# Patient Record
Sex: Male | Born: 1950 | Race: White | Hispanic: No | Marital: Married | Smoking: Never smoker
Health system: Southern US, Community
[De-identification: ages and names within clinical notes are randomized; demographics above are authoritative.]

## PROBLEM LIST (undated history)

## (undated) DIAGNOSIS — E1149 Type 2 diabetes mellitus with other diabetic neurological complication: Principal | ICD-10-CM

## (undated) DIAGNOSIS — E669 Obesity, unspecified: Secondary | ICD-10-CM

## (undated) DIAGNOSIS — I10 Essential (primary) hypertension: Secondary | ICD-10-CM

## (undated) DIAGNOSIS — K8020 Calculus of gallbladder without cholecystitis without obstruction: Secondary | ICD-10-CM

## (undated) DIAGNOSIS — E119 Type 2 diabetes mellitus without complications: Secondary | ICD-10-CM

## (undated) DIAGNOSIS — E291 Testicular hypofunction: Secondary | ICD-10-CM

## (undated) DIAGNOSIS — S2239XA Fracture of one rib, unspecified side, initial encounter for closed fracture: Secondary | ICD-10-CM

## (undated) DIAGNOSIS — F329 Major depressive disorder, single episode, unspecified: Secondary | ICD-10-CM

## (undated) DIAGNOSIS — E785 Hyperlipidemia, unspecified: Secondary | ICD-10-CM

## (undated) HISTORY — DX: Major depressive disorder, single episode, unspecified: F32.9

## (undated) HISTORY — DX: Essential (primary) hypertension: I10

## (undated) HISTORY — PX: NASAL POLYP SURGERY: SHX186

## (undated) HISTORY — DX: Testicular hypofunction: E29.1

## (undated) HISTORY — DX: Type 2 diabetes mellitus without complications: E11.9

## (undated) HISTORY — DX: Calculus of gallbladder without cholecystitis without obstruction: K80.20

## (undated) HISTORY — DX: Obesity, unspecified: E66.9

## (undated) HISTORY — DX: Fracture of one rib, unspecified side, initial encounter for closed fracture: S22.39XA

## (undated) HISTORY — DX: Hyperlipidemia, unspecified: E78.5

## (undated) HISTORY — DX: Type 2 diabetes mellitus with other diabetic neurological complication: E11.49

---

## 2003-01-21 DIAGNOSIS — S2249XA Multiple fractures of ribs, unspecified side, initial encounter for closed fracture: Secondary | ICD-10-CM

## 2003-01-21 HISTORY — DX: Multiple fractures of ribs, unspecified side, initial encounter for closed fracture: S22.49XA

## 2006-12-25 ENCOUNTER — Ambulatory Visit: Payer: Self-pay | Admitting: Internal Medicine

## 2006-12-25 DIAGNOSIS — E119 Type 2 diabetes mellitus without complications: Secondary | ICD-10-CM

## 2006-12-25 DIAGNOSIS — F3289 Other specified depressive episodes: Secondary | ICD-10-CM

## 2006-12-25 DIAGNOSIS — E291 Testicular hypofunction: Secondary | ICD-10-CM

## 2006-12-25 DIAGNOSIS — E1149 Type 2 diabetes mellitus with other diabetic neurological complication: Secondary | ICD-10-CM | POA: Insufficient documentation

## 2006-12-25 DIAGNOSIS — I10 Essential (primary) hypertension: Secondary | ICD-10-CM

## 2006-12-25 DIAGNOSIS — F339 Major depressive disorder, recurrent, unspecified: Secondary | ICD-10-CM

## 2006-12-25 DIAGNOSIS — E785 Hyperlipidemia, unspecified: Secondary | ICD-10-CM

## 2006-12-25 DIAGNOSIS — F329 Major depressive disorder, single episode, unspecified: Secondary | ICD-10-CM

## 2006-12-25 HISTORY — DX: Essential (primary) hypertension: I10

## 2006-12-25 HISTORY — DX: Hyperlipidemia, unspecified: E78.5

## 2006-12-25 HISTORY — DX: Type 2 diabetes mellitus without complications: E11.9

## 2006-12-25 HISTORY — DX: Type 2 diabetes mellitus with other diabetic neurological complication: E11.49

## 2006-12-25 HISTORY — DX: Major depressive disorder, single episode, unspecified: F32.9

## 2006-12-25 HISTORY — DX: Testicular hypofunction: E29.1

## 2006-12-25 HISTORY — DX: Other specified depressive episodes: F32.89

## 2006-12-25 LAB — CONVERTED CEMR LAB
Cholesterol, target level: 200 mg/dL
HDL goal, serum: 40 mg/dL
LDL Goal: 100 mg/dL

## 2006-12-28 LAB — CONVERTED CEMR LAB
ALT: 35 units/L (ref 0–53)
AST: 27 units/L (ref 0–37)
Albumin: 4.4 g/dL (ref 3.5–5.2)
Alkaline Phosphatase: 63 units/L (ref 39–117)
BUN: 15 mg/dL (ref 6–23)
Bilirubin, Direct: 0.2 mg/dL (ref 0.0–0.3)
CO2: 29 meq/L (ref 19–32)
Calcium: 10.4 mg/dL (ref 8.4–10.5)
Chloride: 102 meq/L (ref 96–112)
Cholesterol: 128 mg/dL (ref 0–200)
Creatinine, Ser: 0.9 mg/dL (ref 0.4–1.5)
Creatinine,U: 125.7 mg/dL
GFR calc Af Amer: 112 mL/min
GFR calc non Af Amer: 92 mL/min
Glucose, Bld: 166 mg/dL — ABNORMAL HIGH (ref 70–99)
HDL: 31.5 mg/dL — ABNORMAL LOW (ref >39.0)
Hgb A1c MFr Bld: 6.6 % — ABNORMAL HIGH (ref 4.6–6.0)
LDL Cholesterol: 77 mg/dL (ref 0–99)
Microalb Creat Ratio: 25.5 mg/g (ref 0.0–30.0)
Microalb, Ur: 3.2 mg/dL — ABNORMAL HIGH (ref 0.0–1.9)
Potassium: 4.9 meq/L (ref 3.5–5.1)
Sodium: 139 meq/L (ref 135–145)
Total Bilirubin: 0.5 mg/dL (ref 0.3–1.2)
Total CHOL/HDL Ratio: 4.1
Total Protein: 6.9 g/dL (ref 6.0–8.3)
Triglycerides: 99 mg/dL (ref 0–149)
VLDL: 20 mg/dL (ref 0–40)

## 2007-01-04 ENCOUNTER — Encounter: Admission: RE | Admit: 2007-01-04 | Discharge: 2007-01-05 | Payer: Self-pay | Admitting: Internal Medicine

## 2007-01-27 ENCOUNTER — Encounter: Payer: Self-pay | Admitting: Internal Medicine

## 2007-05-18 ENCOUNTER — Ambulatory Visit: Payer: Self-pay | Admitting: Internal Medicine

## 2007-05-21 LAB — CONVERTED CEMR LAB
ALT: 35 units/L (ref 0–53)
BUN: 21 mg/dL (ref 6–23)
CO2: 25 meq/L (ref 19–32)
Chloride: 107 meq/L (ref 96–112)
Cholesterol: 104 mg/dL (ref 0–200)
GFR calc non Af Amer: 92 mL/min
Glucose, Bld: 123 mg/dL — ABNORMAL HIGH (ref 70–99)
LDL Cholesterol: 38 mg/dL (ref 0–99)
PSA: 0.65 ng/mL (ref 0.10–4.00)
Potassium: 4 meq/L (ref 3.5–5.1)

## 2007-06-11 ENCOUNTER — Telehealth: Payer: Self-pay | Admitting: Internal Medicine

## 2007-06-30 ENCOUNTER — Telehealth: Payer: Self-pay | Admitting: *Deleted

## 2007-08-09 ENCOUNTER — Encounter: Payer: Self-pay | Admitting: Internal Medicine

## 2007-09-03 ENCOUNTER — Ambulatory Visit: Payer: Self-pay | Admitting: Internal Medicine

## 2007-09-03 LAB — CONVERTED CEMR LAB
AST: 26 units/L (ref 0–37)
Albumin: 4.3 g/dL (ref 3.5–5.2)
CO2: 27 meq/L (ref 19–32)
GFR calc Af Amer: 112 mL/min
GFR calc non Af Amer: 92 mL/min
Glucose, Bld: 104 mg/dL — ABNORMAL HIGH (ref 70–99)
HDL: 31.6 mg/dL — ABNORMAL LOW (ref >39.0)
Hgb A1c MFr Bld: 6.8 % — ABNORMAL HIGH (ref 4.6–6.0)
Sodium: 141 meq/L (ref 135–145)
Total Bilirubin: 0.7 mg/dL (ref 0.3–1.2)

## 2007-09-10 ENCOUNTER — Ambulatory Visit: Payer: Self-pay | Admitting: Internal Medicine

## 2007-09-10 DIAGNOSIS — E669 Obesity, unspecified: Secondary | ICD-10-CM

## 2007-09-10 HISTORY — DX: Obesity, unspecified: E66.9

## 2007-10-15 ENCOUNTER — Encounter: Payer: Self-pay | Admitting: Internal Medicine

## 2007-12-27 ENCOUNTER — Telehealth: Payer: Self-pay | Admitting: Internal Medicine

## 2008-01-25 ENCOUNTER — Ambulatory Visit: Payer: Self-pay | Admitting: Internal Medicine

## 2008-01-27 LAB — CONVERTED CEMR LAB
AST: 24 units/L (ref 0–37)
BUN: 23 mg/dL (ref 6–23)
CO2: 29 meq/L (ref 19–32)
Calcium: 10.2 mg/dL (ref 8.4–10.5)
Creatinine, Ser: 1 mg/dL (ref 0.4–1.5)
GFR calc Af Amer: 99 mL/min
Glucose, Bld: 163 mg/dL — ABNORMAL HIGH (ref 70–99)
HDL: 39.1 mg/dL (ref >39.0)
Sodium: 141 meq/L (ref 135–145)
Triglycerides: 97 mg/dL (ref 0–149)
VLDL: 19 mg/dL (ref 0–40)

## 2008-05-10 ENCOUNTER — Telehealth: Payer: Self-pay | Admitting: Internal Medicine

## 2008-05-23 ENCOUNTER — Ambulatory Visit: Payer: Self-pay | Admitting: Internal Medicine

## 2008-05-23 LAB — CONVERTED CEMR LAB
AST: 27 units/L (ref 0–37)
Albumin: 4 g/dL (ref 3.5–5.2)
CO2: 24 meq/L (ref 19–32)
Calcium: 9.3 mg/dL (ref 8.4–10.5)
Glucose, Bld: 207 mg/dL — ABNORMAL HIGH (ref 70–99)
HDL: 32.9 mg/dL — ABNORMAL LOW (ref >39.00)
LDL Cholesterol: 63 mg/dL (ref 0–99)
Potassium: 4 meq/L (ref 3.5–5.1)
Sodium: 139 meq/L (ref 135–145)
Total Bilirubin: 0.6 mg/dL (ref 0.3–1.2)
Total CHOL/HDL Ratio: 4
Triglycerides: 150 mg/dL — ABNORMAL HIGH (ref 0.0–149.0)
VLDL: 30 mg/dL (ref 0.0–40.0)

## 2008-05-30 ENCOUNTER — Ambulatory Visit: Payer: Self-pay | Admitting: Internal Medicine

## 2008-08-23 ENCOUNTER — Ambulatory Visit: Payer: Self-pay | Admitting: Internal Medicine

## 2008-08-23 LAB — CONVERTED CEMR LAB
ALT: 27 units/L (ref 0–53)
AST: 26 units/L (ref 0–37)
Albumin: 4 g/dL (ref 3.5–5.2)
CO2: 29 meq/L (ref 19–32)
Calcium: 9.4 mg/dL (ref 8.4–10.5)
Cholesterol: 120 mg/dL (ref 0–200)
Direct LDL: 61.2 mg/dL
GFR calc non Af Amer: 81.34 mL/min (ref >60)
HDL: 36.1 mg/dL — ABNORMAL LOW (ref >39.00)
Potassium: 4.5 meq/L (ref 3.5–5.1)
Sodium: 141 meq/L (ref 135–145)
Total CHOL/HDL Ratio: 3
Triglycerides: 218 mg/dL — ABNORMAL HIGH (ref 0.0–149.0)

## 2008-08-30 ENCOUNTER — Ambulatory Visit: Payer: Self-pay | Admitting: Internal Medicine

## 2008-09-07 ENCOUNTER — Telehealth: Payer: Self-pay | Admitting: Internal Medicine

## 2008-12-21 ENCOUNTER — Ambulatory Visit: Payer: Self-pay | Admitting: Internal Medicine

## 2008-12-21 LAB — CONVERTED CEMR LAB
ALT: 25 units/L (ref 0–53)
Alkaline Phosphatase: 73 units/L (ref 39–117)
BUN: 27 mg/dL — ABNORMAL HIGH (ref 6–23)
Bilirubin, Direct: 0 mg/dL (ref 0.0–0.3)
Calcium: 9.5 mg/dL (ref 8.4–10.5)
Cholesterol: 125 mg/dL (ref 0–200)
Creatinine, Ser: 0.9 mg/dL (ref 0.4–1.5)
GFR calc non Af Amer: 91.75 mL/min (ref >60)
Hgb A1c MFr Bld: 8.3 % — ABNORMAL HIGH (ref 4.6–6.5)
Total Protein: 7.3 g/dL (ref 6.0–8.3)
Triglycerides: 181 mg/dL — ABNORMAL HIGH (ref 0.0–149.0)

## 2009-01-26 ENCOUNTER — Ambulatory Visit: Payer: Self-pay | Admitting: Internal Medicine

## 2009-06-22 ENCOUNTER — Ambulatory Visit: Payer: Self-pay | Admitting: Internal Medicine

## 2009-06-22 LAB — CONVERTED CEMR LAB
ALT: 43 units/L (ref 0–53)
AST: 30 units/L (ref 0–37)
Alkaline Phosphatase: 59 units/L (ref 39–117)
BUN: 22 mg/dL (ref 6–23)
Bilirubin, Direct: 0.1 mg/dL (ref 0.0–0.3)
GFR calc non Af Amer: 118.49 mL/min (ref >60)
Glucose, Bld: 216 mg/dL — ABNORMAL HIGH (ref 70–99)
Potassium: 4.1 meq/L (ref 3.5–5.1)
Total Bilirubin: 0.5 mg/dL (ref 0.3–1.2)
VLDL: 30.4 mg/dL (ref 0.0–40.0)

## 2009-06-28 ENCOUNTER — Telehealth: Payer: Self-pay | Admitting: Internal Medicine

## 2009-07-11 ENCOUNTER — Ambulatory Visit: Payer: Self-pay | Admitting: Internal Medicine

## 2009-07-11 LAB — HM COLONOSCOPY

## 2009-08-08 ENCOUNTER — Telehealth: Payer: Self-pay | Admitting: Internal Medicine

## 2009-08-15 ENCOUNTER — Telehealth: Payer: Self-pay | Admitting: Internal Medicine

## 2009-11-14 ENCOUNTER — Telehealth: Payer: Self-pay | Admitting: Internal Medicine

## 2009-11-22 ENCOUNTER — Ambulatory Visit: Payer: Self-pay | Admitting: Internal Medicine

## 2009-11-22 LAB — CONVERTED CEMR LAB
Alkaline Phosphatase: 64 units/L (ref 39–117)
Bilirubin, Direct: 0.1 mg/dL (ref 0.0–0.3)
CO2: 27 meq/L (ref 19–32)
Calcium: 9.4 mg/dL (ref 8.4–10.5)
GFR calc non Af Amer: 96.39 mL/min (ref >60)
HDL: 38.7 mg/dL — ABNORMAL LOW (ref >39.00)
Sodium: 139 meq/L (ref 135–145)
Total CHOL/HDL Ratio: 3
Total Protein: 6.7 g/dL (ref 6.0–8.3)

## 2009-12-03 ENCOUNTER — Ambulatory Visit: Payer: Self-pay | Admitting: Internal Medicine

## 2009-12-19 ENCOUNTER — Telehealth: Payer: Self-pay | Admitting: Internal Medicine

## 2010-02-19 NOTE — Progress Notes (Signed)
Summary: Pt called re: refill req from [facility]  Phone Note Call from Patient Call back at Home Phone [phone]   Caller: Patient came in to office Summary of Call: Pt came in to office and said that [facility] has sent numerous req to get med refills for the pt, with no response. Pt needs refills on Sertraline 100mg ,  Novolin Insulin 70/30, Metformin 1000mg  , Generic for Actos 30mg , Benazepril 40mg , Simvastatin 20mg , Losartan/HCTZ, Amlodipine Bes 10mg  Fexofenodine 180mg . Pt has dropped off the original list of meds that he needs asap. Pt almost out of meds. If pts not available when called, pt says that is is ok to speak to his wife [name].      Initial call taken by: Lucy Antigua,  August 15, 2009 9:43 AM    Prescriptions: FEXOFENADINE HCL 180 MG TABS (FEXOFENADINE HCL) Take 1 tablet by mouth once a day  #90 x 3   Entered by:   Kern Reap CMA (AAMA)   Authorized by:   Birdie Sons MD   Signed by:   Kern Reap CMA (AAMA) on 08/15/2009   Method used:   Electronically to        [facility] ([facility] (retail)             , [place]         Ph: [phone]       Fax: [phone]   RxID:   [id number] AMLODIPINE BESYLATE 10 MG  TABS (AMLODIPINE BESYLATE) once daily  #90 x 3   Entered by:   Kern Reap CMA (AAMA)   Authorized by:   Birdie Sons MD   Signed by:   Kern Reap CMA (AAMA) on 08/15/2009   Method used:   Electronically to        [facility] (retail)             , [place]         Ph: [phone]       Fax: [phone]   RxID:   [id number] HYZAAR 100-25 MG TABS (LOSARTAN POTASSIUM-HCTZ) once daily  #90 x 3   Entered by:   Kern Reap CMA (AAMA)   Authorized by:   Birdie Sons MD   Signed by:   Kern Reap CMA (AAMA) on 08/15/2009   Method used:   Electronically to        [facility] (retail)             , [place]         Ph: [phone]       Fax: [phone]   RxID:    [id number] SIMVASTATIN 20 MG TABS (SIMVASTATIN) 1 by mouth at bedtime  #90 x 3   Entered by:   Kern Reap CMA (AAMA)   Authorized by:   Birdie Sons MD   Signed by:   Kern Reap CMA (AAMA) on 08/15/2009   Method used:   Electronically to        [facility] (retail)             , [place]         Ph: [phone]       Fax: [phone]   RxID:   [id number] BENAZEPRIL HCL 40 MG  TABS (BENAZEPRIL HCL) 1 tablet by mouth daily  #90 x 3   Entered by:   Kern Reap CMA (AAMA)   Authorized by:   Birdie Sons MD   Signed by:   Kern Reap CMA (  AAMA) on 08/15/2009   Method used:   Electronically to        [facility] (retail)             , [place]         Ph: [phone]       Fax: [phone]   RxID:   [id number] ACTOS 30 MG  TABS (PIOGLITAZONE HCL) Take 1 tablet by mouth once a day  #90 x 3   Entered by:   Kern Reap CMA (AAMA)   Authorized by:   Birdie Sons MD   Signed by:   Kern Reap CMA (AAMA) on 08/15/2009   Method used:   Electronically to        [facility] (retail)             , [place]         Ph: [phone]       Fax: [phone]   RxID:   [id number] METFORMIN HCL 1000 MG TABS (METFORMIN HCL) Take 1 tablet by mouth two times a day  #180 x 3   Entered by:   Kern Reap CMA (AAMA)   Authorized by:   Birdie Sons MD   Signed by:   Kern Reap CMA (AAMA) on 08/15/2009   Method used:   Electronically to        [facility] (retail)             , [place]         Ph: [phone]       Fax: [phone]   RxID:   [id number] NOVOLIN 70/30 70-30 % SUSP (INSULIN ISOPHANE & REGULAR) 60 units subcutaneously  two times a day with meals.  #6 x 3   Entered by:   Kern Reap CMA (AAMA)   Authorized by:   Birdie Sons MD   Signed by:   Kern Reap CMA (AAMA) on 08/15/2009   Method used:   Electronically to        [facility] (retail)             , [place]         Ph: [phone]        Fax: [phone]   RxID:   [id number] ZOLOFT 100 MG  TABS (SERTRALINE HCL) Take 1 tablet by mouth once a day  #90 x 3   Entered by:   Kern Reap CMA (AAMA)   Authorized by:   Birdie Sons MD   Signed by:   Kern Reap CMA (AAMA) on 08/15/2009   Method used:   Electronically to        [facility] (retail)             , [place]         Ph: [phone]       Fax: [phone]   RxID:   [id number]
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
## 2010-02-19 NOTE — Progress Notes (Signed)
Summary: wants generic  Phone Note Call from Patient Call back at [phone]   Caller: Patient--live call Reason for Call: Talk to Nurse Summary of Call: Pt is currently on Triglide 160 mg tab 1qd. He would like to switch to the generic, which is Fenofibrate 160mg  tablets 1qd. I spoke with Dr Lovell Sheehan and he said that it is ok to switch. pt will need #30 called to [facility] on [address] and a 90 day supply with refills send to [facility] @ fax [phone]. any? call pt. Initial call taken by: Warnell Forester,  June 28, 2009 12:54 PM  Follow-up for Phone Call        lmoam=pt with info given. Follow-up by: Warnell Forester,  June 29, 2009 3:50 PM    New/Updated Medications: FENOFIBRATE 160 MG TABS (FENOFIBRATE) take one tab by mouth once daily Prescriptions: FENOFIBRATE 160 MG TABS (FENOFIBRATE) take one tab by mouth once daily  #90 x 3   Entered by:   Kern Reap CMA (AAMA)   Authorized by:   Stacie Glaze MD   Signed by:   Kern Reap CMA (AAMA) on 06/29/2009   Method used:   Printed then faxed to ...       [facility] [address] [address] [phone]* (retail)       [address]       [place], [place]  [address]       Ph: [phone] or [phone]       Fax: [phone]   RxID:   [id number] FENOFIBRATE 160 MG TABS (FENOFIBRATE) take one tab by mouth once daily  #30 x 0   Entered by:   Kern Reap CMA (AAMA)   Authorized by:   Roderick Pee MD   Signed by:   Kern Reap CMA (AAMA) on 06/29/2009   Method used:   Electronically to        [facility] [address] [phone]* (retail)       [address]       [place], [place]  [address]       Ph: [phone] or [phone]       Fax: [phone]   RxID:   [id number]

## 2010-02-19 NOTE — Progress Notes (Signed)
Summary: finofibrate  Phone Note From Pharmacy   Summary of Call: rx clairfication Initial call taken by: Kern Reap CMA Duncan Dull),  August 08, 2009 10:10 AM    Prescriptions: FENOFIBRATE 160 MG TABS (FENOFIBRATE) take one tab by mouth once daily  #90 x 3   Entered by:   Kern Reap CMA (AAMA)   Authorized by:   Birdie Sons MD   Signed by:   Kern Reap CMA (AAMA) on 08/08/2009   Method used:   Electronically to        [facility] ([facility] (retail)             , [place]         Ph:        Fax: [phone]   RxID:   [id number]
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
## 2010-02-19 NOTE — Progress Notes (Signed)
Summary: [facility]  Phone Note Refill Request   Refills Requested: Medication #1:  ACTOS 30 MG  TABS Take 1 tablet by mouth once a day  Medication #2:  HYZAAR 100-25 MG TABS once daily  Medication #3:  SIMVASTATIN 20 MG TABS 1 by mouth at bedtime  Medication #4:  NOVOLIN 70/30 70-30 % SUSP 60 units subcutaneously  two times a day with meals. pt needs all meds sent to [facility] call [phone] fax [phone]  please put employee number on rx's [phone]  Initial call taken by: Heron Sabins,  November 14, 2009 11:48 AM  Follow-up for Phone Call        pt switched pharmacies Follow-up by: Alfred Levins, CMA,  November 15, 2009 4:38 PM    Prescriptions: NOVOLIN 70/30 70-30 % SUSP (INSULIN ISOPHANE & REGULAR) 60 units subcutaneously  two times a day with meals.  #6 x 0   Entered by:   Alfred Levins, CMA   Authorized by:   Birdie Sons MD   Signed by:   Alfred Levins, CMA on 11/15/2009   Method used:   Faxed to ...       [facility] (mail-order)       [address] [place], [place]  [address]       Ph: [phone]       Fax: [phone]   RxID:   [id number] SIMVASTATIN 20 MG TABS (SIMVASTATIN) 1 by mouth at bedtime  #90 x 0   Entered by:   Alfred Levins, CMA   Authorized by:   Birdie Sons MD   Signed by:   Alfred Levins, CMA on 11/15/2009   Method used:   Faxed to ...       [facility] ([facility]order)       [address] [place], [place]  [address]       Ph: [phone]       Fax: [phone]   RxID:   [id number] HYZAAR 100-25 MG TABS (LOSARTAN POTASSIUM-HCTZ) once daily  #90 x 0   Entered by:   Alfred Levins, CMA   Authorized by:   Birdie Sons MD   Signed by:   Alfred Levins, CMA on 11/15/2009   Method used:   Faxed to ...       [facility] (mail[facility]order)       [address] [place], [place]  [address]       Ph: [phone]       Fax: [phone]   RxID:   [id number] ACTOS 30 MG  TABS (PIOGLITAZONE HCL) Take 1 tablet by mouth  once a day  #90 x 0   Entered by:   Alfred Levins, CMA   Authorized by:   Birdie Sons MD   Signed by:   Alfred Levins, CMA on 11/15/2009   Method used:   Faxed to ...       [facility] (mail-order)       [address] [place], [place]  [address]       Ph: [phone]       Fax: [phone]   RxID:   [id number]
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
## 2010-02-19 NOTE — Progress Notes (Signed)
Summary: new rx  Phone Note Refill Request   Refills Requested: Medication #1:  EXEL INSULIN SYRINGE 30G X 5/16" 1 ML MISC use as directed two times a day new rx [facility] [phone]  Initial call taken by: Heron Sabins,  December 19, 2009 3:05 PM  Follow-up for Phone Call        Rx called to pharmacy Follow-up by: Alfred Levins, CMA,  December 19, 2009 4:54 PM    Prescriptions: EXEL INSULIN SYRINGE 30G X 5/16" 1 ML MISC (INSULIN SYRINGE-NEEDLE U-100) use as directed two times a day  #200 x 5   Entered by:   Alfred Levins, CMA   Authorized by:   Birdie Sons MD   Signed by:   Alfred Levins, CMA on 12/19/2009   Method used:   [facility] to        [facility]  [phone]* (retail)       [address] [place], [place]  [address]       Ph: [phone] or [phone]       Fax: [phone]   RxID:   [id number]

## 2010-02-26 ENCOUNTER — Other Ambulatory Visit: Payer: Self-pay | Admitting: Internal Medicine

## 2010-02-26 DIAGNOSIS — E119 Type 2 diabetes mellitus without complications: Secondary | ICD-10-CM

## 2010-02-26 DIAGNOSIS — E785 Hyperlipidemia, unspecified: Secondary | ICD-10-CM

## 2010-02-28 ENCOUNTER — Other Ambulatory Visit (INDEPENDENT_AMBULATORY_CARE_PROVIDER_SITE_OTHER): Payer: BC Managed Care – PPO | Admitting: Internal Medicine

## 2010-02-28 DIAGNOSIS — T887XXA Unspecified adverse effect of drug or medicament, initial encounter: Secondary | ICD-10-CM

## 2010-02-28 DIAGNOSIS — E785 Hyperlipidemia, unspecified: Secondary | ICD-10-CM

## 2010-02-28 DIAGNOSIS — M3390 Dermatopolymyositis, unspecified, organ involvement unspecified: Secondary | ICD-10-CM

## 2010-02-28 LAB — BASIC METABOLIC PANEL
BUN: 20 mg/dL (ref 6–23)
Calcium: 9.1 mg/dL (ref 8.4–10.5)
Creatinine, Ser: 0.7 mg/dL (ref 0.4–1.5)
GFR: 118.21 mL/min (ref >60.00)
Glucose, Bld: 197 mg/dL — ABNORMAL HIGH (ref 70–99)
Sodium: 139 mEq/L (ref 135–145)

## 2010-02-28 LAB — LIPID PANEL
HDL: 39.9 mg/dL (ref >39.00)
Triglycerides: 128 mg/dL (ref 0.0–149.0)
VLDL: 25.6 mg/dL (ref 0.0–40.0)

## 2010-02-28 LAB — HEMOGLOBIN A1C: Hgb A1c MFr Bld: 8.2 % — ABNORMAL HIGH (ref 4.6–6.5)

## 2010-02-28 LAB — HEPATIC FUNCTION PANEL
Albumin: 3.9 g/dL (ref 3.5–5.2)
Alkaline Phosphatase: 72 U/L (ref 39–117)

## 2010-03-07 ENCOUNTER — Encounter: Payer: Self-pay | Admitting: Internal Medicine

## 2010-03-08 ENCOUNTER — Encounter: Payer: Self-pay | Admitting: Internal Medicine

## 2010-03-08 ENCOUNTER — Ambulatory Visit: Payer: Self-pay | Admitting: Internal Medicine

## 2010-03-08 ENCOUNTER — Ambulatory Visit (INDEPENDENT_AMBULATORY_CARE_PROVIDER_SITE_OTHER): Payer: BC Managed Care – PPO | Admitting: Internal Medicine

## 2010-03-08 DIAGNOSIS — E785 Hyperlipidemia, unspecified: Secondary | ICD-10-CM

## 2010-03-08 DIAGNOSIS — F329 Major depressive disorder, single episode, unspecified: Secondary | ICD-10-CM

## 2010-03-08 DIAGNOSIS — I10 Essential (primary) hypertension: Secondary | ICD-10-CM

## 2010-03-08 DIAGNOSIS — F3289 Other specified depressive episodes: Secondary | ICD-10-CM

## 2010-03-08 DIAGNOSIS — E669 Obesity, unspecified: Secondary | ICD-10-CM

## 2010-03-08 DIAGNOSIS — E119 Type 2 diabetes mellitus without complications: Secondary | ICD-10-CM
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
## 2010-03-08 NOTE — Assessment & Plan Note (Signed)
Not controlled, but some better than previously He does admit to some noncompliance related to work schedule

## 2010-03-08 NOTE — Assessment & Plan Note (Signed)
Remarkable control given weight Continue same meds

## 2010-03-08 NOTE — Assessment & Plan Note (Signed)
Doing well on current meds Continue same

## 2010-03-08 NOTE — Assessment & Plan Note (Signed)
Controlled Continue same meds 

## 2010-03-08 NOTE — Assessment & Plan Note (Signed)
This is clearly his most significant problem Discussed need for aggressive weight loss

## 2010-04-25 ENCOUNTER — Ambulatory Visit: Payer: BC Managed Care – PPO | Admitting: Family Medicine

## 2010-07-03 ENCOUNTER — Other Ambulatory Visit (INDEPENDENT_AMBULATORY_CARE_PROVIDER_SITE_OTHER): Payer: BC Managed Care – PPO

## 2010-07-03 DIAGNOSIS — E119 Type 2 diabetes mellitus without complications: Secondary | ICD-10-CM

## 2010-07-03 DIAGNOSIS — I10 Essential (primary) hypertension: Secondary | ICD-10-CM

## 2010-07-03 DIAGNOSIS — E785 Hyperlipidemia, unspecified: Secondary | ICD-10-CM

## 2010-07-03 LAB — BASIC METABOLIC PANEL
BUN: 22 mg/dL (ref 6–23)
Chloride: 105 mEq/L (ref 96–112)
Creatinine, Ser: 0.9 mg/dL (ref 0.4–1.5)
GFR: 91.27 mL/min (ref >60.00)
Glucose, Bld: 208 mg/dL — ABNORMAL HIGH (ref 70–99)

## 2010-07-03 LAB — HEPATIC FUNCTION PANEL
ALT: 23 U/L (ref 0–53)
Bilirubin, Direct: 0.1 mg/dL (ref 0.0–0.3)
Total Bilirubin: 0.5 mg/dL (ref 0.3–1.2)

## 2010-07-03 LAB — LIPID PANEL
HDL: 42.8 mg/dL (ref >39.00)
Total CHOL/HDL Ratio: 3
VLDL: 35.4 mg/dL (ref 0.0–40.0)

## 2010-07-03 LAB — HEMOGLOBIN A1C: Hgb A1c MFr Bld: 8.2 % — ABNORMAL HIGH (ref 4.6–6.5)

## 2010-07-04 ENCOUNTER — Other Ambulatory Visit: Payer: BC Managed Care – PPO

## 2010-07-10 ENCOUNTER — Encounter: Payer: Self-pay | Admitting: Internal Medicine

## 2010-07-10 ENCOUNTER — Ambulatory Visit (INDEPENDENT_AMBULATORY_CARE_PROVIDER_SITE_OTHER): Payer: BC Managed Care – PPO | Admitting: Internal Medicine

## 2010-07-10 DIAGNOSIS — E669 Obesity, unspecified: Secondary | ICD-10-CM

## 2010-07-10 DIAGNOSIS — I10 Essential (primary) hypertension: Secondary | ICD-10-CM

## 2010-07-10 DIAGNOSIS — E785 Hyperlipidemia, unspecified: Secondary | ICD-10-CM

## 2010-07-10 DIAGNOSIS — E119 Type 2 diabetes mellitus without complications: Secondary | ICD-10-CM

## 2010-07-10 DIAGNOSIS — Z2911 Encounter for prophylactic immunotherapy for respiratory syncytial virus (RSV): Secondary | ICD-10-CM

## 2010-07-10 MED ORDER — INSULIN NPH ISOPHANE & REGULAR (70-30) 100 UNIT/ML ~~LOC~~ SUSP: 65.0000 [IU] | Freq: Two times a day (BID) | SUBCUTANEOUS | Status: DC

## 2010-07-10 NOTE — Assessment & Plan Note (Signed)
Adequate control. Continue current medications. 

## 2010-07-10 NOTE — Assessment & Plan Note (Signed)
Fair control especially given weight. Continue current medications.

## 2010-07-10 NOTE — Assessment & Plan Note (Signed)
Not as well controlled as i would like Needs to lose weight  Lab Results  Component Value Date   HGBA1C 8.2* 07/03/2010

## 2010-07-10 NOTE — Assessment & Plan Note (Signed)
Discussed need for aggressive weight loss. I advised exercising at least 30 minutes every day. He should follow a 1500-calorie diet.
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
## 2010-07-10 NOTE — Patient Instructions (Signed)
Shoulder Range of Motion Exercises   The shoulder is the most flexible joint in the human body. Because of this it is also the most unstable joint in the body. All ages can develop shoulder problems. Early treatment of problems is necessary for a good outcome. People react to shoulder pain by decreasing the movement of the joint. After a brief period of time, the shoulder can become “frozen”. This is an almost complete loss of the ability to move the damaged shoulder.   Following injuries your caregivers can give you instructions on exercises to keep your range of motion (ability to move your shoulder freely), or regain it if it has been lost.   EXERCISES TO KEEP OR MAINTAIN YOUR SHOULDER'S MOBILITY   CODMAN'S EXERCISE OR PENDULUM EXERCISE   This exercise may be performed in a prone (face-down) lying position or standing while leaning on a chair with the opposite arm. Its purpose is to relax the muscles in your shoulder and slowly but surely increase the range of motion and to relieve pain.   Lie on your stomach close to the side edge of the bed. Let your weak arm hang over the edge of the bed. Relax your shoulder, arm and hand. Let your shoulder blade relax and drop down.   Slowly and gently swing your arm forward and back. Do not use your neck muscles; relax them. It might be easier to have someone else gently start swinging your arm.   As pain decreases, increase your swing. To start, arm swing should begin at 15 degree angles. In time and as pain lessens, move to 30-45 degree angles. Start with swinging for about 15 seconds, and work towards swinging for 3 to 5 minutes.   This exercise may also be performed in a standing/bent over position.   Stand and hold onto a sturdy chair with your good arm. Bend forward at the waist and bend your knees slightly to help protect your back. Relax your weak arm, let it hang limp. Relax your shoulder blade and let it drop.   Keep your shoulder relaxed and use body motion to  swing your arm in small circles.   Stand up tall and relax.   Repeat motion and change direction of circles.   Start with swinging for about 30 seconds, and work towards swinging for 3 to 5 minutes.   STRETCHING EXERCISES   #1: Lift your arm out in front of you with the elbow bent at 90 degrees. Using your other arm gently pull the elbow forward and across your body.   #2: Bend one arm behind you with the palm facing outward. Using the other arm, hold a towel or rope and reach this arm up above your head, then bend it at the elbow to move your wrist to behind your neck. Grab the free end of the towel with the hand behind your back. Gently pull the towel up with the hand behind your neck, gradually increasing the pull on the hand behind the small of your back. Then, gradually pull down with the hand behind the small of your back. This will pull the hand and arm behind your neck further. Both shoulders will have an increased range of motion with repetition of this exercise.   STRENGTHENING EXERCISES   Standing with your arm at your side and straight out from your shoulder with the elbow bent at 90 degrees, hold onto a small weight and slowly raise your hand so it points straight up   in the air. Repeat this five times to begin with, and gradually increase to ten times. Do this four times per day. As you grow stronger you can gradually increase the weight.   Repeat the above exercise, only this time using an elastic band. Start with your hand up in the air and pull down until your hand is by your side. As you grow stronger, gradually increase the amount you pull by increasing the number or size of the elastic bands. Use the same amount of repetitions.   Standing with your hand at your side and holding onto a weight, gradually lift the hand in front of you until it is over your head. Do the same also with the hand remaining at your side and lift the hand away from your body until it is again over your head. Repeat this  five times to begin with, and gradually increase to ten times. Do this four times per day. As you grow stronger you can gradually increase the weight.   Do all exercises with weights that are small and easy to use when beginning your exercises. Gradually increase the weight as you grow stronger. Do not do exercises to the point of pain. If your shoulder becomes painful, decrease the number of repetitions or the amount of weight used. As your comfort increases you can gradually return to the same weights, resistance, or number of repetitions.   Document Released: 10/05/2002   [employer]® Patient Information ©2011 [employer].

## 2010-07-11 ENCOUNTER — Ambulatory Visit: Payer: BC Managed Care – PPO | Admitting: Internal Medicine

## 2010-07-23 ENCOUNTER — Telehealth: Payer: Self-pay | Admitting: Internal Medicine

## 2010-07-23 DIAGNOSIS — E119 Type 2 diabetes mellitus without complications: Secondary | ICD-10-CM

## 2010-07-23 MED ORDER — SERTRALINE HCL 100 MG PO TABS: 100.0000 mg | ORAL_TABLET | Freq: Every day | ORAL | Status: DC

## 2010-07-23 MED ORDER — PIOGLITAZONE HCL 30 MG PO TABS: 30.0000 mg | ORAL_TABLET | Freq: Every day | ORAL | Status: DC

## 2010-07-23 MED ORDER — INSULIN NPH ISOPHANE & REGULAR (70-30) 100 UNIT/ML ~~LOC~~ SUSP: 65.0000 [IU] | Freq: Two times a day (BID) | SUBCUTANEOUS | Status: DC

## 2010-07-23 NOTE — Telephone Encounter (Signed)
rx sent in electronically 

## 2010-07-23 NOTE — Telephone Encounter (Signed)
[facility] [address] [facility]ts insulin NPH-insulin regular (NOVOLIN 70/30) (70-30) 100 UNIT/ML injection. Pt also needs zoloft and actos called in.  Pls call in asap. Pharmacy gave pt 1 wks worth of insulin to last until script could be called in. Pls call in today.

## 2010-08-01 ENCOUNTER — Other Ambulatory Visit: Payer: Self-pay | Admitting: Internal Medicine

## 2010-09-06 ENCOUNTER — Other Ambulatory Visit: Payer: Self-pay | Admitting: *Deleted

## 2010-09-06 DIAGNOSIS — E785 Hyperlipidemia, unspecified: Secondary | ICD-10-CM

## 2010-09-06 MED ORDER — SIMVASTATIN 20 MG PO TABS: 20.0000 mg | ORAL_TABLET | Freq: Every day | ORAL | Status: DC

## 2010-11-01 ENCOUNTER — Other Ambulatory Visit (INDEPENDENT_AMBULATORY_CARE_PROVIDER_SITE_OTHER): Payer: BC Managed Care – PPO

## 2010-11-01 ENCOUNTER — Other Ambulatory Visit: Payer: Self-pay | Admitting: Internal Medicine

## 2010-11-01 DIAGNOSIS — E119 Type 2 diabetes mellitus without complications: Secondary | ICD-10-CM

## 2010-11-01 DIAGNOSIS — I10 Essential (primary) hypertension: Secondary | ICD-10-CM

## 2010-11-01 LAB — BASIC METABOLIC PANEL
BUN: 18 mg/dL (ref 6–23)
CO2: 29 mEq/L (ref 19–32)
Chloride: 106 mEq/L (ref 96–112)
Creatinine, Ser: 0.7 mg/dL (ref 0.4–1.5)
Glucose, Bld: 180 mg/dL — ABNORMAL HIGH (ref 70–99)
Potassium: 4.5 mEq/L (ref 3.5–5.1)

## 2010-11-01 LAB — LIPID PANEL
Cholesterol: 140 mg/dL (ref 0–200)
Triglycerides: 248 mg/dL — ABNORMAL HIGH (ref 0.0–149.0)

## 2010-11-01 LAB — HEPATIC FUNCTION PANEL
ALT: 27 U/L (ref 0–53)
AST: 26 U/L (ref 0–37)
Albumin: 4.1 g/dL (ref 3.5–5.2)
Total Protein: 7.3 g/dL (ref 6.0–8.3)

## 2010-11-01 LAB — HEMOGLOBIN A1C: Hgb A1c MFr Bld: 7.6 % — ABNORMAL HIGH (ref 4.6–6.5)

## 2010-11-01 LAB — LDL CHOLESTEROL, DIRECT: Direct LDL: 72.9 mg/dL

## 2010-11-08 ENCOUNTER — Encounter: Payer: Self-pay | Admitting: Internal Medicine

## 2010-11-08 ENCOUNTER — Ambulatory Visit (INDEPENDENT_AMBULATORY_CARE_PROVIDER_SITE_OTHER): Payer: BC Managed Care – PPO | Admitting: Internal Medicine

## 2010-11-08 DIAGNOSIS — E119 Type 2 diabetes mellitus without complications: Secondary | ICD-10-CM

## 2010-11-08 DIAGNOSIS — I10 Essential (primary) hypertension: Secondary | ICD-10-CM

## 2010-11-08 DIAGNOSIS — E785 Hyperlipidemia, unspecified: Secondary | ICD-10-CM

## 2010-11-08 MED ORDER — DESONIDE 0.05 % EX CREA: TOPICAL_CREAM | Freq: Two times a day (BID) | CUTANEOUS | Status: DC

## 2010-11-08 NOTE — Assessment & Plan Note (Signed)
BP Readings from Last 3 Encounters:  11/08/10 116/72  07/10/10 136/68  03/08/10 122/66   Well controlled Check labs today

## 2010-11-08 NOTE — Assessment & Plan Note (Signed)
Some improvement Needs to lose weight --he voices understanding

## 2010-11-08 NOTE — Assessment & Plan Note (Signed)
Adequate control---TG would be better with weight loss
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
## 2010-11-18 ENCOUNTER — Other Ambulatory Visit: Payer: Self-pay | Admitting: *Deleted

## 2010-11-18 DIAGNOSIS — E785 Hyperlipidemia, unspecified: Secondary | ICD-10-CM

## 2010-11-18 MED ORDER — SIMVASTATIN 20 MG PO TABS: 20.0000 mg | ORAL_TABLET | Freq: Every day | ORAL | Status: DC

## 2010-11-18 MED ORDER — BENAZEPRIL HCL 40 MG PO TABS: 40.0000 mg | ORAL_TABLET | Freq: Every day | ORAL | Status: DC

## 2010-11-18 MED ORDER — AMLODIPINE BESYLATE 10 MG PO TABS: 10.0000 mg | ORAL_TABLET | Freq: Every day | ORAL | Status: DC

## 2010-11-18 MED ORDER — METFORMIN HCL 1000 MG PO TABS: 1000.0000 mg | ORAL_TABLET | Freq: Two times a day (BID) | ORAL | Status: DC

## 2010-11-18 MED ORDER — LOSARTAN POTASSIUM-HCTZ 100-25 MG PO TABS: 1.0000 | ORAL_TABLET | Freq: Every day | ORAL | Status: DC

## 2010-12-26 ENCOUNTER — Other Ambulatory Visit: Payer: Self-pay | Admitting: Internal Medicine

## 2011-02-04 ENCOUNTER — Other Ambulatory Visit: Payer: Self-pay | Admitting: *Deleted

## 2011-02-04 DIAGNOSIS — E119 Type 2 diabetes mellitus without complications: Secondary | ICD-10-CM

## 2011-02-04 MED ORDER — PIOGLITAZONE HCL 30 MG PO TABS: 30.0000 mg | ORAL_TABLET | Freq: Every day | ORAL | Status: DC

## 2011-02-26 ENCOUNTER — Other Ambulatory Visit: Payer: Self-pay | Admitting: *Deleted

## 2011-02-26 DIAGNOSIS — E119 Type 2 diabetes mellitus without complications: Secondary | ICD-10-CM

## 2011-04-11 ENCOUNTER — Other Ambulatory Visit: Payer: Self-pay | Admitting: Internal Medicine

## 2011-04-30 ENCOUNTER — Other Ambulatory Visit (INDEPENDENT_AMBULATORY_CARE_PROVIDER_SITE_OTHER): Payer: BC Managed Care – PPO

## 2011-04-30 DIAGNOSIS — E119 Type 2 diabetes mellitus without complications: Secondary | ICD-10-CM

## 2011-04-30 LAB — HEPATIC FUNCTION PANEL
ALT: 25 U/L (ref 0–53)
Albumin: 4.1 g/dL (ref 3.5–5.2)
Bilirubin, Direct: 0.1 mg/dL (ref 0.0–0.3)
Total Protein: 7.3 g/dL (ref 6.0–8.3)

## 2011-04-30 LAB — BASIC METABOLIC PANEL
CO2: 23 mEq/L (ref 19–32)
Chloride: 104 mEq/L (ref 96–112)
Creatinine, Ser: 0.8 mg/dL (ref 0.4–1.5)
Potassium: 4.4 mEq/L (ref 3.5–5.1)
Sodium: 141 mEq/L (ref 135–145)

## 2011-04-30 LAB — LIPID PANEL
Cholesterol: 124 mg/dL (ref 0–200)
HDL: 38.3 mg/dL — ABNORMAL LOW (ref >39.00)
Triglycerides: 139 mg/dL (ref 0.0–149.0)

## 2011-05-02 ENCOUNTER — Other Ambulatory Visit: Payer: BC Managed Care – PPO

## 2011-05-08 ENCOUNTER — Ambulatory Visit (INDEPENDENT_AMBULATORY_CARE_PROVIDER_SITE_OTHER): Payer: BC Managed Care – PPO | Admitting: Internal Medicine

## 2011-05-08 ENCOUNTER — Encounter: Payer: Self-pay | Admitting: Internal Medicine

## 2011-05-08 VITALS — BP 124/74 | HR 92 | Temp 98.8°F | Ht 69.0 in | Wt 337.0 lb

## 2011-05-08 DIAGNOSIS — E119 Type 2 diabetes mellitus without complications: Secondary | ICD-10-CM

## 2011-05-08 DIAGNOSIS — E785 Hyperlipidemia, unspecified: Secondary | ICD-10-CM

## 2011-05-08 DIAGNOSIS — I10 Essential (primary) hypertension: Secondary | ICD-10-CM

## 2011-05-08 DIAGNOSIS — E669 Obesity, unspecified: Secondary | ICD-10-CM
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
## 2011-05-08 NOTE — Assessment & Plan Note (Signed)
He admits to eating way too much "i can't eat enough"  Will continue current meds Advised to lose weight  Exercise every day.

## 2011-05-08 NOTE — Assessment & Plan Note (Signed)
BP Readings from Last 3 Encounters:  05/08/11 124/74  11/08/10 116/72  07/10/10 136/68   Fair control

## 2011-05-08 NOTE — Assessment & Plan Note (Signed)
Well controlled Continue meds 

## 2011-05-08 NOTE — Assessment & Plan Note (Signed)
This is clearly his most significant problem. Discussed weight loss options

## 2011-05-09 ENCOUNTER — Ambulatory Visit: Payer: BC Managed Care – PPO | Admitting: Internal Medicine

## 2011-07-10 ENCOUNTER — Other Ambulatory Visit: Payer: Self-pay | Admitting: Internal Medicine

## 2011-07-11 NOTE — Telephone Encounter (Signed)
Done

## 2011-09-26 ENCOUNTER — Other Ambulatory Visit: Payer: Self-pay | Admitting: Internal Medicine

## 2011-10-31 ENCOUNTER — Other Ambulatory Visit: Payer: BC Managed Care – PPO

## 2011-11-05 ENCOUNTER — Other Ambulatory Visit: Payer: Self-pay | Admitting: Internal Medicine

## 2011-11-07 ENCOUNTER — Ambulatory Visit: Payer: BC Managed Care – PPO | Admitting: Internal Medicine

## 2011-12-09 ENCOUNTER — Other Ambulatory Visit: Payer: Self-pay | Admitting: Internal Medicine

## 2012-02-24 ENCOUNTER — Other Ambulatory Visit: Payer: Self-pay | Admitting: Internal Medicine

## 2012-05-22 ENCOUNTER — Other Ambulatory Visit: Payer: Self-pay | Admitting: Internal Medicine

## 2012-06-01 ENCOUNTER — Other Ambulatory Visit: Payer: Self-pay | Admitting: Internal Medicine

## 2012-06-13 ENCOUNTER — Other Ambulatory Visit: Payer: Self-pay | Admitting: Internal Medicine

## 2012-06-17 ENCOUNTER — Telehealth: Payer: Self-pay | Admitting: Internal Medicine

## 2012-06-17 MED ORDER — AMLODIPINE BESYLATE 10 MG PO TABS: ORAL_TABLET | ORAL | Status: DC

## 2012-06-17 MED ORDER — PIOGLITAZONE HCL 30 MG PO TABS: ORAL_TABLET | ORAL | Status: DC

## 2012-06-17 MED ORDER — FENOFIBRATE 160 MG PO TABS: ORAL_TABLET | ORAL | Status: DC

## 2012-06-17 NOTE — Telephone Encounter (Signed)
rx sent in electronically for the fenofibrate, amlodipine, and pioglitazone for a 90 day supply to [facility]

## 2012-06-17 NOTE — Telephone Encounter (Signed)
Pt has appt on 7-15. Please call refill amlodipine 10 mg, fenofibrate 160 mg ,pioglitazone 30 mg 90 day rx for each med call [facility]

## 2012-06-25 ENCOUNTER — Ambulatory Visit (INDEPENDENT_AMBULATORY_CARE_PROVIDER_SITE_OTHER): Payer: BC Managed Care – PPO | Admitting: Internal Medicine

## 2012-06-25 ENCOUNTER — Encounter: Payer: Self-pay | Admitting: Internal Medicine

## 2012-06-25 ENCOUNTER — Telehealth: Payer: Self-pay | Admitting: Internal Medicine

## 2012-06-25 VITALS — BP 132/70 | Temp 98.7°F | Wt 326.0 lb

## 2012-06-25 DIAGNOSIS — R112 Nausea with vomiting, unspecified: Secondary | ICD-10-CM | POA: Insufficient documentation

## 2012-06-25 DIAGNOSIS — I10 Essential (primary) hypertension: Secondary | ICD-10-CM

## 2012-06-25 DIAGNOSIS — E785 Hyperlipidemia, unspecified: Secondary | ICD-10-CM

## 2012-06-25 DIAGNOSIS — R197 Diarrhea, unspecified: Secondary | ICD-10-CM

## 2012-06-25 DIAGNOSIS — E119 Type 2 diabetes mellitus without complications: Secondary | ICD-10-CM

## 2012-06-25 LAB — HEPATIC FUNCTION PANEL
Albumin: 4.5 g/dL (ref 3.5–5.2)
Alkaline Phosphatase: 60 U/L (ref 39–117)
Bilirubin, Direct: 0.1 mg/dL (ref 0.0–0.3)
Indirect Bilirubin: 0.4 mg/dL (ref 0.0–0.9)
Total Bilirubin: 0.5 mg/dL (ref 0.3–1.2)

## 2012-06-25 LAB — CBC WITH DIFFERENTIAL/PLATELET
Basophils Relative: 0 % (ref 0–1)
Eosinophils Absolute: 0.5 10*3/uL (ref 0.0–0.7)
Eosinophils Relative: 6 % — ABNORMAL HIGH (ref 0–5)
Hemoglobin: 14.9 g/dL (ref 13.0–17.0)
Lymphs Abs: 1.9 10*3/uL (ref 0.7–4.0)
MCH: 33.6 pg (ref 26.0–34.0)
MCHC: 34.7 g/dL (ref 30.0–36.0)
MCV: 96.8 fL (ref 78.0–100.0)
Monocytes Absolute: 0.7 10*3/uL (ref 0.1–1.0)
Monocytes Relative: 8 % (ref 3–12)
RBC: 4.44 MIL/uL (ref 4.22–5.81)

## 2012-06-25 LAB — BASIC METABOLIC PANEL
Calcium: 9.7 mg/dL (ref 8.4–10.5)
Glucose, Bld: 272 mg/dL — ABNORMAL HIGH (ref 70–99)
Potassium: 4.5 mEq/L (ref 3.5–5.3)
Sodium: 136 mEq/L (ref 135–145)

## 2012-06-25 MED ORDER — ONDANSETRON HCL 4 MG PO TABS: 4.0000 mg | ORAL_TABLET | Freq: Three times a day (TID) | ORAL | Status: DC | PRN

## 2012-06-25 NOTE — Telephone Encounter (Signed)
Patient Information:  Caller Name: [name]  Phone: [phone]  Patient: Billy Hale, Billy Hale  Gender: Male  DOB: [birth year]  Age: 62 Years  PCP: Birdie Sons (Adults only)  Office Follow Up:  Does the office need to follow up with this patient?: Yes  Instructions For The Office: Pt wants to come to office vs ED.  RN Note:  Wife calling for pt. Pt is sleeping after being up all night with vomiting and diarrhea.  ED declined   Symptoms  Reason For Call & Symptoms: Belching, abdominal bloating started 3 days ago 06/21/12.  Diarrhea and vomiting started 06/24/12 with multiple episodes.   Caller answered no regarding chest pain or shortness of breath.  Pt has sinus congestions and a mild headache with lots of coughing that started approx 1 wk ago. Pt's Glucometer is broken.   Reviewed Health History In EMR: Yes  Reviewed Medications In EMR: Yes  Reviewed Allergies In EMR: Yes  Reviewed Surgeries / Procedures: Yes  Date of Onset of Symptoms: 06/22/2012  Treatments Tried: Antacid  Treatments Tried Worked: No  Guideline(s) Used:  Vomiting  Sinus Pain and Congestion  Disposition Per Guideline:   See Today or Tomorrow in Office  Reason For Disposition Reached:   Lots of coughing  Advice Given:  Reassurance:  Adults with vomiting need to stay hydrated. This is the most important thing. If you don't drink and replace lost fluids, you may get dehydrated.  For Non-stop Vomiting, Try Sleeping:  Try to go to sleep (Reason: sleep often empties the stomach and relieves the need to vomit).  When you awaken, resume drinking liquids. Water works best initially.  Patient Refused Recommendation:  Patient Will Follow Up With Office Later  Pt wants to come to office vs ED.

## 2012-06-25 NOTE — Assessment & Plan Note (Signed)
Patient has history of uncontrolled type 2 diabetes. He is at high risk for hypoglycemia. Patient advised to hold his insulin if his blood sugar readings are less than 150. Patient advised check his blood sugars 4 times a day. If he does use insulin, he will use half dose or 30 units twice daily.

## 2012-06-25 NOTE — Telephone Encounter (Signed)
appt scheduled

## 2012-06-25 NOTE — Assessment & Plan Note (Signed)
Patient advised to hold ACE inhibitor and ARB. Once diarrhea, nausea and vomiting resolved. Consider consolidating his medications. I suggest discontinuing ACE inhibitor and changing his losartan to more potent ARB. BP: 132/70 mmHg
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
## 2012-06-25 NOTE — Patient Instructions (Signed)
Hold Lotensin, fenofibrate, Zocor, Losartan / Hctz, glucophage, and Actos Take 1/2 dose of your Insulin twice daily Check your blood sugars 4 times daily and contact our office if you are experiencing low blood sugars Increase fluid intake Bland diet until diarrhea improves Use imodium 2 mg every 6 hrs as needed for diarrhea If you experience persistent nausea and vomiting, you need to seek emergency medical care.

## 2012-06-25 NOTE — Assessment & Plan Note (Signed)
Hold fenofibrate and zocor.

## 2012-06-25 NOTE — Assessment & Plan Note (Signed)
62 year old white male with history of uncontrolled type 2 diabetes complains of acute worsening nausea and vomiting x24-48 hours. Unclear whether his symptoms secondary to broad gastroenteritis, pancreatitis, and side effect of medication.  Obtain stat electrolytes, kidney function, LFTs and serum lipase. Use Zofran for nausea. Patient Advised increase fluid intake. Use Imodium for now. If patient unable to adequately treat dehydration at home, he understands to seek emergency medical care.

## 2012-06-28 ENCOUNTER — Encounter: Payer: Self-pay | Admitting: Internal Medicine

## 2012-06-28 ENCOUNTER — Other Ambulatory Visit (INDEPENDENT_AMBULATORY_CARE_PROVIDER_SITE_OTHER): Payer: BC Managed Care – PPO

## 2012-06-28 ENCOUNTER — Ambulatory Visit (INDEPENDENT_AMBULATORY_CARE_PROVIDER_SITE_OTHER): Payer: BC Managed Care – PPO | Admitting: Internal Medicine

## 2012-06-28 ENCOUNTER — Telehealth: Payer: Self-pay | Admitting: Internal Medicine

## 2012-06-28 VITALS — BP 140/60 | Temp 98.3°F | Wt 337.0 lb

## 2012-06-28 DIAGNOSIS — IMO0001 Reserved for inherently not codable concepts without codable children: Secondary | ICD-10-CM

## 2012-06-28 DIAGNOSIS — I10 Essential (primary) hypertension: Secondary | ICD-10-CM

## 2012-06-28 DIAGNOSIS — R112 Nausea with vomiting, unspecified: Secondary | ICD-10-CM

## 2012-06-28 DIAGNOSIS — E785 Hyperlipidemia, unspecified: Secondary | ICD-10-CM

## 2012-06-28 LAB — BASIC METABOLIC PANEL
CO2: 27 mEq/L (ref 19–32)
Chloride: 105 mEq/L (ref 96–112)
Creatinine, Ser: 0.9 mg/dL (ref 0.4–1.5)
Glucose, Bld: 254 mg/dL — ABNORMAL HIGH (ref 70–99)

## 2012-06-28 LAB — HEPATIC FUNCTION PANEL
ALT: 54 U/L — ABNORMAL HIGH (ref 0–53)
AST: 57 U/L — ABNORMAL HIGH (ref 0–37)
Albumin: 3.7 g/dL (ref 3.5–5.2)
Total Protein: 7.4 g/dL (ref 6.0–8.3)

## 2012-06-28 MED ORDER — VALSARTAN-HYDROCHLOROTHIAZIDE 160-25 MG PO TABS: 1.0000 | ORAL_TABLET | Freq: Every day | ORAL | Status: DC

## 2012-06-28 MED ORDER — GLUCOSE BLOOD VI STRP: ORAL_STRIP | Status: DC

## 2012-06-28 MED ORDER — COLESEVELAM HCL 625 MG PO TABS: 1250.0000 mg | ORAL_TABLET | Freq: Two times a day (BID) | ORAL | Status: DC

## 2012-06-28 NOTE — Assessment & Plan Note (Signed)
Patient with poorly controlled type 2 diabetes. Refer to endocrinologist for further evaluation and treatment. Decrease metformin dose to 500 mg twice daily. Discontinue Actos.

## 2012-06-28 NOTE — Telephone Encounter (Signed)
Pt would like to transfer from Dr Cato Mulligan to Dr Artist Pais. Dr. Artist Pais has agreed.

## 2012-06-28 NOTE — Assessment & Plan Note (Addendum)
We discussed risks of using both ACE inhibitor and ARB. Discontinue benazepril. It is likely causing ACE inhibitor cough. Discontinue Hyzaar. Switch to Diovan hydrochlorothiazide 160/25 mg once daily. Continue amlodipine 10 mg once daily. Reassess in 6 weeks.  Recent BMET showed low bicarb level. This may be secondary to protracted vomiting. Repeat electrolytes and kidney function before next office visit.

## 2012-06-28 NOTE — Assessment & Plan Note (Signed)
Discontinued fenofibrate. Continue simvastatin 20 mg once daily. Start WelChol 625 mg 2 tablets twice a day with meals.

## 2012-06-28 NOTE — Patient Instructions (Addendum)
Stop taking benazepril, fenofibrate, and Actos Take lower dose of metformin Resume your previous dose of insulin
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
## 2012-06-28 NOTE — Assessment & Plan Note (Signed)
Acute nausea and vomiting of unclear etiology improved. It may of been secondary to viral gastroenteritis versus medication side effect. His symptoms significantly improved with holding metformin/oral diabetic medications. I doubt mild transaminitis secondary to acute hepatitis. I suspect mild LFT elevation secondary to fatty liver versus medication side effect.

## 2012-06-29 ENCOUNTER — Ambulatory Visit: Payer: BC Managed Care – PPO | Admitting: Internal Medicine

## 2012-06-29 NOTE — Telephone Encounter (Signed)
Ok per Dr Swords 

## 2012-06-30 ENCOUNTER — Other Ambulatory Visit: Payer: Self-pay | Admitting: *Deleted

## 2012-06-30 LAB — CLOSTRIDIUM DIFFICILE BY PCR: Toxigenic C. Difficile by PCR: NOT DETECTED

## 2012-06-30 MED ORDER — INSULIN NPH ISOPHANE & REGULAR (70-30) 100 UNIT/ML ~~LOC~~ SUSP: SUBCUTANEOUS | Status: DC

## 2012-07-01 ENCOUNTER — Telehealth: Payer: Self-pay | Admitting: Internal Medicine

## 2012-07-01 NOTE — Telephone Encounter (Signed)
Caller: Joe/Patient; Phone: [phone]; Reason for Call: Caller returned call and relayed information from EMR regarding lab note.  Pt states diarrhea is making marginal improvements, not worsening.  Afebrile.

## 2012-07-09 ENCOUNTER — Encounter: Payer: Self-pay | Admitting: Internal Medicine

## 2012-07-15 ENCOUNTER — Encounter: Payer: Self-pay | Admitting: Internal Medicine

## 2012-07-17 ENCOUNTER — Other Ambulatory Visit: Payer: Self-pay | Admitting: Internal Medicine

## 2012-07-22 ENCOUNTER — Encounter: Payer: Self-pay | Admitting: Internal Medicine

## 2012-07-26 ENCOUNTER — Ambulatory Visit (INDEPENDENT_AMBULATORY_CARE_PROVIDER_SITE_OTHER): Payer: BC Managed Care – PPO | Admitting: Internal Medicine

## 2012-07-26 ENCOUNTER — Encounter: Payer: Self-pay | Admitting: Internal Medicine

## 2012-07-26 VITALS — BP 132/70 | HR 110 | Temp 99.2°F | Resp 16 | Ht 68.5 in | Wt 339.0 lb

## 2012-07-26 DIAGNOSIS — E1149 Type 2 diabetes mellitus with other diabetic neurological complication: Secondary | ICD-10-CM

## 2012-07-26 DIAGNOSIS — E1142 Type 2 diabetes mellitus with diabetic polyneuropathy: Secondary | ICD-10-CM

## 2012-07-26 MED ORDER — INSULIN NPH ISOPHANE & REGULAR (70-30) 100 UNIT/ML ~~LOC~~ SUSP: SUBCUTANEOUS | Status: DC
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
## 2012-07-26 NOTE — Patient Instructions (Signed)
Please stop Metformin. Please take the 70/30 insulin 30 min before a meal! Hold the needle in the skin for 10 seconds after the last unit of insulin is in! Please keep the 70/30 insulin with dinner at 65 units.  Please decrease the insulin with breakfast to 45 units. Please try to have a breakfast every morning and reduce the dinner the night before. Try to have at least 12 hours between dinner and the next breakfast. Try to skip the sugary snack at night. Continue Welchol. Please return in 2-3 weeks with your sugar log.

## 2012-07-27 ENCOUNTER — Other Ambulatory Visit: Payer: Self-pay | Admitting: *Deleted

## 2012-07-27 MED ORDER — "INSULIN SYRINGE-NEEDLE U-100 30G X 5/16"" 1 ML MISC": 1.0000 | Freq: Two times a day (BID) | Status: DC

## 2012-07-29 ENCOUNTER — Other Ambulatory Visit (INDEPENDENT_AMBULATORY_CARE_PROVIDER_SITE_OTHER): Payer: BC Managed Care – PPO

## 2012-07-29 DIAGNOSIS — E785 Hyperlipidemia, unspecified: Secondary | ICD-10-CM

## 2012-07-29 LAB — BASIC METABOLIC PANEL
CO2: 30 mEq/L (ref 19–32)
Chloride: 99 mEq/L (ref 96–112)
Creatinine, Ser: 0.8 mg/dL (ref 0.4–1.5)
Sodium: 131 mEq/L — ABNORMAL LOW (ref 135–145)

## 2012-07-29 LAB — LIPID PANEL
Cholesterol: 162 mg/dL (ref 0–200)
HDL: 43.8 mg/dL (ref >39.00)
LDL Cholesterol: 80 mg/dL (ref 0–99)
Triglycerides: 191 mg/dL — ABNORMAL HIGH (ref 0.0–149.0)

## 2012-07-29 LAB — HEPATIC FUNCTION PANEL
ALT: 86 U/L — ABNORMAL HIGH (ref 0–53)
AST: 76 U/L — ABNORMAL HIGH (ref 0–37)
Albumin: 4 g/dL (ref 3.5–5.2)
Total Bilirubin: 0.6 mg/dL (ref 0.3–1.2)

## 2012-07-30 ENCOUNTER — Encounter: Payer: Self-pay | Admitting: Internal Medicine

## 2012-08-03 ENCOUNTER — Ambulatory Visit: Payer: BC Managed Care – PPO | Admitting: Internal Medicine

## 2012-08-05 ENCOUNTER — Ambulatory Visit
Admission: RE | Admit: 2012-08-05 | Discharge: 2012-08-05 | Disposition: A | Discharge status: Home / Self Care | Payer: BC Managed Care – PPO | Source: Ambulatory Visit | Attending: Internal Medicine | Admitting: Internal Medicine

## 2012-08-09 ENCOUNTER — Ambulatory Visit: Payer: BC Managed Care – PPO | Admitting: Internal Medicine

## 2012-08-10 ENCOUNTER — Other Ambulatory Visit: Payer: Self-pay | Admitting: Internal Medicine

## 2012-08-10 DIAGNOSIS — K8000 Calculus of gallbladder with acute cholecystitis without obstruction: Secondary | ICD-10-CM

## 2012-08-11 ENCOUNTER — Encounter (INDEPENDENT_AMBULATORY_CARE_PROVIDER_SITE_OTHER): Payer: Self-pay | Admitting: General Surgery

## 2012-08-11 ENCOUNTER — Ambulatory Visit (INDEPENDENT_AMBULATORY_CARE_PROVIDER_SITE_OTHER): Payer: BC Managed Care – PPO | Admitting: General Surgery

## 2012-08-11 VITALS — BP 160/84 | HR 100 | Resp 18 | Ht 69.0 in | Wt 332.8 lb

## 2012-08-11 DIAGNOSIS — K8020 Calculus of gallbladder without cholecystitis without obstruction: Secondary | ICD-10-CM

## 2012-08-11 DIAGNOSIS — R112 Nausea with vomiting, unspecified: Secondary | ICD-10-CM
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
## 2012-08-11 NOTE — Patient Instructions (Signed)
Call or email me with how you would like to proceed (surgery or weight loss surgery evaluation)  Cholelithiasis Cholelithiasis (also called gallstones) is a form of gallbladder disease where gallstones form in your gallbladder. The gallbladder is a non-essential organ that stores bile made in the liver, which helps digest fats. Gallstones begin as small crystals and slowly grow into stones. Gallstone pain occurs when the gallbladder spasms, and a gallstone is blocking the duct. Pain can also occur when a stone passes out of the duct.  Women are more likely to develop gallstones than men. Other factors that increase the risk of gallbladder disease are:  Having multiple pregnancies. Physicians sometimes advise removing diseased gallbladders before future pregnancies.  Obesity.  Diets heavy in fried foods and fat.  Increasing age (older than 27).  Prolonged use of medications containing male hormones.  Diabetes mellitus.  Rapid weight loss.  Family history of gallstones (heredity). SYMPTOMS  Feeling sick to your stomach (nauseous).  Abdominal pain.  Yellowing of the skin (jaundice).  Sudden pain. It may persist from several minutes to several hours.  Worsening pain with deep breathing or when jarred.  Fever.  Tenderness to the touch. In some cases, when gallstones do not move into the bile duct, people have no pain or symptoms. These are called "silent" gallstones. TREATMENT In severe cases, emergency surgery may be required. HOME CARE INSTRUCTIONS   Only take over-the-counter or prescription medicines for pain, discomfort, or fever as directed by your caregiver.  Follow a low-fat diet until seen again. Fat causes the gallbladder to contract, which can result in pain.  Follow up as instructed. Attacks are almost always recurrent and surgery is usually required for permanent treatment. SEEK IMMEDIATE MEDICAL CARE IF:   Your pain increases and is not controlled by  medications.  You have an oral temperature above 102 F (38.9 C), not controlled by medication.  You develop nausea and vomiting. MAKE SURE YOU:   Understand these instructions.  Will watch your condition.  Will get help right away if you are not doing well or get worse. Document Released: 01/02/2005 Document Revised: 03/31/2011 Document Reviewed: 03/07/2010 [facility] Patient Information 2014 [place], [place].

## 2012-08-15 ENCOUNTER — Encounter (INDEPENDENT_AMBULATORY_CARE_PROVIDER_SITE_OTHER): Payer: Self-pay | Admitting: General Surgery

## 2012-08-19 ENCOUNTER — Ambulatory Visit (INDEPENDENT_AMBULATORY_CARE_PROVIDER_SITE_OTHER): Payer: BC Managed Care – PPO | Admitting: Internal Medicine

## 2012-08-19 ENCOUNTER — Encounter: Payer: Self-pay | Admitting: Internal Medicine

## 2012-08-19 VITALS — BP 104/62 | HR 98 | Temp 98.9°F | Resp 12 | Wt 330.0 lb

## 2012-08-19 DIAGNOSIS — E1142 Type 2 diabetes mellitus with diabetic polyneuropathy: Secondary | ICD-10-CM

## 2012-08-19 DIAGNOSIS — E1149 Type 2 diabetes mellitus with other diabetic neurological complication: Secondary | ICD-10-CM

## 2012-08-19 MED ORDER — INSULIN ASPART 100 UNIT/ML ~~LOC~~ SOLN: SUBCUTANEOUS | Status: DC

## 2012-08-19 MED ORDER — INSULIN GLARGINE 100 UNIT/ML ~~LOC~~ SOLN: 75.0000 [IU] | Freq: Every day | SUBCUTANEOUS | Status: DC

## 2012-08-19 MED ORDER — METFORMIN HCL 500 MG PO TABS: 500.0000 mg | ORAL_TABLET | Freq: Two times a day (BID) | ORAL | Status: DC

## 2012-08-19 NOTE — Progress Notes (Signed)
Patient ID: Billy Hale, male   DOB: 07/08/1950, 62 y.o.   MRN: [id number]  HPI: Billy Hale is a 62 y.o.-year-old male, referred by his PCP, Dr. Artist Pais, for management of DM2, dx 2004, insulin-dependent, uncontrolled, with complications (peripheral neuropathy). He is here with his wife, who offers part of the history.   Pt was recently dx with gall stones >> might need surgery later this year.   Last hemoglobin A1c was: Lab Results  Component Value Date   HGBA1C 8.5* 06/25/2012  Prev. 8.6%.  Pt is on a regimen of: - at last visit we stopped Metformin 500 mg po bid as he had N/V/D/dizziness/falling with more >> but still nauseated and dizzy and having alternating D/C. - NPH 70/30 45 in am and 65 units before dinner- taken 30 min before a meal - Welchol 1250 bid  He stopped Actos 45 ~ 2 mo ago.   Pt checks his sugars 4-8 a day and brings a detailed log, which we reviewed together: - am: 300-360 >> 300-400 - late afternoon: 300s No lows lately. Lowest sugar was 280;  he has hypoglycemia awareness at 70. Highest sugar was 400s lately.  Pt does not have chronic kidney disease, last BUN/creatinine was:  Lab Results  Component Value Date   BUN 11 07/29/2012   CREATININE 0.8 07/29/2012   Last set of lipids: Lab Results  Component Value Date   CHOL 162 07/29/2012   HDL 43.80 07/29/2012   LDLCALC 80 07/29/2012   LDLDIRECT 72.9 11/01/2010   TRIG 191.0* 07/29/2012   CHOLHDL 4 07/29/2012   Pt's last eye exam was in 2012 - did not have a chance to reschedule a new exam. No DR reportedly; had cataracts. Denies numbness and tingling in his legs.   He also has a history of mild transaminitis, HTN- on Diovan HCT, Amlodipine, HL - on Welchol, obesity, depression, hypogonadism, OSA - compliant with CPAP.   At last visit, we discussed about weight loss, but he told me that he was not motivated to lose weight. We also discussed about possible gastric bypass surgery, however he does not feel that he as the  money to do it (they tried in the past but he was to expensive), and also he does not feel that he could comply with the recommended diet. At last visit, I referred him to nutrition although,he did not feel motivated to change his diet, as he gets angry when hungry. He also mentioned that he did not expect to live more than about 7-8 more years - he has had a good life and does not feel the desire to change anything to increase his lifespan further...  ROS: Constitutional: no weight gain/loss, no fatigue, + decreased appetite, + hot flushes, poor sleep Eyes: + occasionally blurry vision, no xerophthalmia ENT: no sore throat, no nodules palpated in throat, no dysphagia/odynophagia, no hoarseness Cardiovascular: no CP/SOB/+ palpitations/leg swelling Respiratory: no cough/SOB Gastrointestinal: + all: N/V/D/C/GERD Musculoskeletal: + all: muscle/joint aches Skin: no rashes Neurological: no tremors/numbness/tingling/dizziness, + HA  PE: BP 104/62  Pulse 98  Temp(Src) 98.9 F (37.2 C) (Oral)  Resp 12  Wt 330 lb (149.687 kg)  BMI 48.71 kg/m2  SpO2 95% Wt Readings from Last 3 Encounters:  08/19/12 330 lb (149.687 kg)  08/11/12 332 lb 12.8 oz (150.957 kg)  07/26/12 339 lb (153.769 kg)   Constitutional: overweight, in NAD, large neck, pale Eyes: PERRLA, EOMI, no exophthalmos ENT: moist mucous membranes, no thyromegaly, no cervical lymphadenopathy, Mallampati 3.5 Cardiovascular:  tachycardia, RR, No MRG Respiratory: CTA B Gastrointestinal: abdomen soft, NT, ND, BS+ Musculoskeletal: no deformities, strength intact in all 4 Skin: moist, warm, no rashes  ASSESSMENT: 1. DM2, insulin-dependent, uncontrolled, with complications - peripheral neuropathy  PLAN:   1. Patient with deteriorating diabetes control, on a high insulin dose regimen, with a mixed insulin. We had to stop Metformin at last visit as he was c/o N/V/dizziness. He stopped it but does not feel different >> I advised him to  restart >> new Rx to his pharmacy. - Patient has very poor diet, and he skips breakfast most of the days. At last visit, we discussed about different ways in improving his diet, specific examples, and I recommended that he eats a breakfast every day, while cutting calories from his dinner and eating it earlier. He tells me that he is now trying to eat breakfast every day for. - at last visit,I advised him to inject his 70/30 insulin 30 minutes before meals, rather than one to 2 hours after meal, as he was doing before. Also,he is now holding the needle in 10 seconds after injection. - we discussed about the fact that the 70/30 insulin is not ideal, since it is not amenable to only changing the long or the short acting insulin - I therefore suggested that he start Lantus and NovoLog (they tell me that these lesions are covered by insurance), and I sent numerous conditions to his pharmacy. I advised switch to the following regimen: Start Lantus 75 units at bedtime. Start NovoLog: - 20 units with a small meal - 25 units with a larger meal - 30 units with a large meal. Start Metformin at 500 mg 2x a day, with a meal. - we did discuss about U500 insulin, we might need to do this in the future, if he needs more than 200 units a day - will continue WelChol at the current dose - I again advised him to schedule an appointment with ophthalmology  - I was see him back in 3-4 weeks with his sugar log

## 2012-08-19 NOTE — Patient Instructions (Addendum)
Please stop 70/30 insulin. Start Lantus 75 units at bedtime. Start NovoLog: - 20 units with a small meal - 25 units with a larger meal - 30 units with a large meal. Start Metformin at 500 mg 2x a day, with a meal. Please return in 3-4 weeks.

## 2012-08-20 ENCOUNTER — Ambulatory Visit (INDEPENDENT_AMBULATORY_CARE_PROVIDER_SITE_OTHER): Payer: BC Managed Care – PPO | Admitting: General Surgery

## 2012-08-25 ENCOUNTER — Other Ambulatory Visit: Payer: Self-pay

## 2012-08-30 ENCOUNTER — Other Ambulatory Visit: Payer: Self-pay | Admitting: Internal Medicine

## 2012-09-06 ENCOUNTER — Other Ambulatory Visit: Payer: Self-pay | Admitting: Internal Medicine

## 2012-09-08 ENCOUNTER — Telehealth: Payer: Self-pay | Admitting: *Deleted

## 2012-09-08 NOTE — Telephone Encounter (Signed)
Pt notified via mychart

## 2012-09-08 NOTE — Telephone Encounter (Signed)
Message copied by Jacqualyn Posey on Wed Sep 08, 2012  4:25 PM ------      Message from: Meda Coffee      Created: Mon Sep 06, 2012  1:30 PM       Please make sure pt only taking Valsartan/Hctz and NOT losartan/Hctz.  Pt should have also stopped benazepril.            Thomos Lemons ------

## 2012-09-09 ENCOUNTER — Encounter: Payer: Self-pay | Admitting: Internal Medicine

## 2012-09-09 ENCOUNTER — Ambulatory Visit (INDEPENDENT_AMBULATORY_CARE_PROVIDER_SITE_OTHER): Payer: BC Managed Care – PPO | Admitting: Internal Medicine

## 2012-09-09 VITALS — BP 134/60 | HR 80 | Temp 98.2°F | Resp 20 | Ht 69.0 in | Wt 328.0 lb

## 2012-09-09 DIAGNOSIS — H8110 Benign paroxysmal vertigo, unspecified ear: Secondary | ICD-10-CM | POA: Insufficient documentation

## 2012-09-09 DIAGNOSIS — E1149 Type 2 diabetes mellitus with other diabetic neurological complication: Secondary | ICD-10-CM

## 2012-09-09 DIAGNOSIS — I10 Essential (primary) hypertension: Secondary | ICD-10-CM

## 2012-09-09 DIAGNOSIS — K7689 Other specified diseases of liver: Secondary | ICD-10-CM

## 2012-09-09 DIAGNOSIS — E1142 Type 2 diabetes mellitus with diabetic polyneuropathy: Secondary | ICD-10-CM

## 2012-09-09 DIAGNOSIS — K760 Fatty (change of) liver, not elsewhere classified: Secondary | ICD-10-CM | POA: Insufficient documentation

## 2012-09-09 NOTE — Assessment & Plan Note (Signed)
Stable.  He is off ACE inhibitor.  Monitor electrolytes and kidney function.

## 2012-09-09 NOTE — Assessment & Plan Note (Signed)
Patient's insulin regimen recently changed by his endocrinologist. His blood sugars are still uncontrolled. Patient advised to contact Dr. Elvera Lennox for further instructions regarding insulin adjustment. Patient has high insulin resistance.  Consider change lantus to NPH tid.

## 2012-09-09 NOTE — Assessment & Plan Note (Signed)
Elevated LFTs likely secondary to fatty liver. Check hepatitis serologies and screen for hemachromatosis.
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
## 2012-09-09 NOTE — Assessment & Plan Note (Signed)
Patient has intermittent vertigo consistent with BPV. Refer to vestibular rehabilitation.

## 2012-09-09 NOTE — Patient Instructions (Addendum)
Contact Dr. Elvera Lennox re: elevated blood sugar readings.

## 2012-09-13 LAB — HM DIABETES EYE EXAM

## 2012-09-16 ENCOUNTER — Ambulatory Visit: Payer: BC Managed Care – PPO | Admitting: *Deleted

## 2012-09-23 ENCOUNTER — Encounter: Payer: Self-pay | Admitting: Internal Medicine

## 2012-09-23 ENCOUNTER — Other Ambulatory Visit (INDEPENDENT_AMBULATORY_CARE_PROVIDER_SITE_OTHER): Payer: BC Managed Care – PPO

## 2012-09-23 ENCOUNTER — Ambulatory Visit: Payer: BC Managed Care – PPO

## 2012-09-23 ENCOUNTER — Ambulatory Visit (INDEPENDENT_AMBULATORY_CARE_PROVIDER_SITE_OTHER): Payer: BC Managed Care – PPO | Admitting: Internal Medicine

## 2012-09-23 VITALS — BP 122/68 | HR 93 | Temp 99.4°F | Resp 12 | Wt 327.0 lb

## 2012-09-23 DIAGNOSIS — I10 Essential (primary) hypertension: Secondary | ICD-10-CM

## 2012-09-23 DIAGNOSIS — E1149 Type 2 diabetes mellitus with other diabetic neurological complication: Secondary | ICD-10-CM

## 2012-09-23 DIAGNOSIS — E1142 Type 2 diabetes mellitus with diabetic polyneuropathy: Secondary | ICD-10-CM

## 2012-09-23 LAB — BASIC METABOLIC PANEL
CO2: 27 mEq/L (ref 19–32)
Chloride: 103 mEq/L (ref 96–112)
Creatinine, Ser: 0.8 mg/dL (ref 0.4–1.5)
Glucose, Bld: 311 mg/dL — ABNORMAL HIGH (ref 70–99)
Potassium: 3.7 mEq/L (ref 3.5–5.1)
Sodium: 137 mEq/L (ref 135–145)

## 2012-09-23 LAB — IBC PANEL: Saturation Ratios: 26 % (ref 20.0–50.0)

## 2012-09-23 MED ORDER — INSULIN REGULAR HUMAN (CONC) 500 UNIT/ML ~~LOC~~ SOLN: SUBCUTANEOUS | Status: DC

## 2012-09-23 MED ORDER — "TUBERCULIN-ALLERGY SYRINGES 28G X 1/2"" 0.5 ML MISC": Status: DC

## 2012-09-23 NOTE — Progress Notes (Signed)
Patient ID: Billy Hale, male   DOB: September 24, 1950, 62 y.o.   MRN: [id number]  HPI: Billy Hale is a 62 y.o.-year-old male, referred by his PCP, Dr. Artist Pais, for management of DM2, dx 2004, insulin-dependent, uncontrolled, with complications (peripheral neuropathy). He is here with his wife, who offers part of the history. Last visit 1.5 mo ago.\  Last hemoglobin A1c was: Lab Results  Component Value Date   HGBA1C 8.5* 06/25/2012  Prev. 8.6%.  Pt is on a regimen of: - at last visits we stopped Metformin 500 mg po bid as he had N/V/D/dizziness/falling with more >> but still nauseated and dizzy and having alternating D/C  - NPH 70/30 45 in am and 65 units before dinner- taken 30 min before a meal - vials - Welchol 1250 bid  He stopped Actos 45 ~ 2 mo ago.   Now he is on: - restarted Metformin 500 mg bid (persistant dizziness >> BPPV >> will have vestibular rehab scheduled soon). Still has diarrhea. - Lantus 75 units at night - vial - NovoLog - vial: - 20 units with a small meal - 25 units with a larger meal - 30 units with a large meal. - Welchol 2 tablets bid  Pt checks his sugars 4-8 a day and brings a detailed log, which we reviewed together: - am: 300-360 >> 300-400 >> 360s-420s - 2h after b'fast: 390s - before lunch: 360s-480s - late afternoon and dinnertime: 360s-502 (max) No lows lately; he has hypoglycemia awareness at 70. Highest sugar was 502 lately.  Pt does not have chronic kidney disease, last BUN/creatinine was:  Lab Results  Component Value Date   BUN 11 07/29/2012   CREATININE 0.8 07/29/2012   Last set of lipids: Lab Results  Component Value Date   CHOL 162 07/29/2012   HDL 43.80 07/29/2012   LDLCALC 80 07/29/2012   LDLDIRECT 72.9 11/01/2010   TRIG 191.0* 07/29/2012   CHOLHDL 4 07/29/2012   Pt's last eye exam was in 2012 - did not have a chance to reschedule a new exam. No DR reportedly; had cataracts. Denies numbness and tingling in his legs.   He also has a history  of mild transaminitis, HTN- on Diovan HCT, Amlodipine, HL - on Welchol, obesity, depression, hypogonadism, OSA - compliant with CPAP.   At one of the last visits, I referred him to nutrition although,he did not feel motivated to change his diet, as he gets angry when hungry. He also mentioned that he did not expect to live more than about 7-8 more years - he has had a good life and does not feel the desire to change anything to increase his lifespan further...  ROS: Constitutional: no weight gain/loss, + fatigue, + hot flushes Eyes: no blurry vision, no xerophthalmia ENT: no sore throat, no nodules palpated in throat, no dysphagia/odynophagia, no hoarseness Cardiovascular: no CP/+ SOB/+ palpitations/leg swelling Respiratory: + cough/+ SOB Gastrointestinal: + N/no V/+ D/no C/no GERD Musculoskeletal: no muscle/joint aches Skin: no rashes Neurological: no tremors/numbness/tingling/dizziness, + HA  I reviewed pt's medications, allergies, PMH, social hx, family hx and no changes required, except as mentioned above. Also, Losartan changed to Valsartan by PCP.   PE: BP 122/68  Pulse 93  Temp(Src) 99.4 F (37.4 C) (Oral)  Resp 12  Wt 327 lb (148.326 kg)  BMI 48.27 kg/m2  SpO2 95% Wt Readings from Last 3 Encounters:  09/23/12 327 lb (148.326 kg)  09/09/12 328 lb (148.78 kg)  08/19/12 330 lb (149.687 kg)  Constitutional: overweight, in NAD, large neck, pale; flat affect Eyes: PERRLA, EOMI, no exophthalmos ENT: moist mucous membranes, no thyromegaly, no cervical lymphadenopathy, Mallampati 3.5 Cardiovascular: tachycardia, RR, No MRG Respiratory: CTA B Gastrointestinal: abdomen soft, NT, ND, BS+ Musculoskeletal: no deformities, strength intact in all 4 Skin: moist, warm, no rashes  ASSESSMENT: 1. DM2, insulin-dependent, uncontrolled, with complications - peripheral neuropathy  PLAN:   1. Patient with deteriorating diabetes control, on a high insulin dose regimen, with a basal-bolus  regimen. At last visit, we switched from premixed insulin to basal-bolus regimen, however, patient's sugars are still in the 300s to 500s on the new regimen despite use of ~175 units of insulin daily. At today's visit, since we need to increase the dose of total daily insulin, I suggested that we switch to the 5x more concentrated U500 insulin. Patient and his wife agree, and I explained the differences, and advantages of using this insulin. I demonstrated on a tuberculin syringe how to draw the below doses up, and I sent to his pharmacy a prescription for tuberculin syringes along with his new insulin. Pt and wife confirmed they understood the dosage and injection use. - I advised switch to the following regimen: 0.07 mL (35 actual units) with breakfast 0.1 mL with lunch (50 actual units) 0.12 mL (60 actual units) with dinner. - I advised him to be very vigilant in the next few days to see if he has any low CBGs, although I do not expect him to have lows since we decreased the total daily dose of his insulin from 175 to 145.  - I advised him to get in contact with me in 2 days to let me know how he is doing, especially if he has any lows or highs. I expect that we will need to increase his doses in the near future. - we will stop WelChol, Lantus, and NovoLog. Continue metformin. - Patient has very poor diet, and he skips breakfast most of the days. At last visits, we discussed about different ways in improving his diet, and he is trying to eat b'fast every day now.  - I was see him back in 3 weeks with his sugar log - Will check Hba1C at next visit.

## 2012-09-23 NOTE — Patient Instructions (Addendum)
Stop Welchol, Lantus and Novolog. Continue metformin. Start U500 insulin. Inject this in your abdomen, 30 minutes before a meal, as follows: - 0.07 mL with breakfast - 0.1 mL with lunch - 0.12 mL with dinner If you do not eat at all for one meal, please do not take the insulin with that meal. Please send me a message through MyChart or call me back in a few days to let me know how you're doing, especially if he developed lows, <100, but also if your sugars stay high, >250s.  Please do not forget that U500 insulin is 5x more concentrated than the insulin that he used before, and will use the following doses: - 7 units of U500 = 35 units of U100 - with breakfast - 10 units of U500 = 50 units of U100 - with lunch - 12 units of U500 = 60 units of U100 - with dinner

## 2012-09-24 LAB — HEPATITIS B SURFACE ANTIBODY,QUALITATIVE: Hep B S Ab: NEGATIVE

## 2012-09-30 ENCOUNTER — Encounter: Payer: Self-pay | Admitting: Internal Medicine

## 2012-09-30 ENCOUNTER — Encounter: Payer: Self-pay | Admitting: *Deleted

## 2012-10-09 ENCOUNTER — Other Ambulatory Visit: Payer: Self-pay | Admitting: Internal Medicine

## 2012-10-19 ENCOUNTER — Encounter: Payer: Self-pay | Admitting: Internal Medicine

## 2012-10-19 ENCOUNTER — Ambulatory Visit (INDEPENDENT_AMBULATORY_CARE_PROVIDER_SITE_OTHER): Payer: BC Managed Care – PPO | Admitting: Internal Medicine

## 2012-10-19 VITALS — BP 120/62 | HR 93 | Temp 98.1°F | Resp 12 | Wt 335.0 lb

## 2012-10-19 DIAGNOSIS — E1149 Type 2 diabetes mellitus with other diabetic neurological complication: Secondary | ICD-10-CM

## 2012-10-19 DIAGNOSIS — E1142 Type 2 diabetes mellitus with diabetic polyneuropathy: Secondary | ICD-10-CM

## 2012-10-19 NOTE — Progress Notes (Signed)
Patient ID: Billy Hale, male   DOB: [birth year], 62 y.o.   MRN: [id number]  HPI: Billy Hale is a 62 y.o.-year-old male, returning for DM2, dx 2004, insulin-dependent, uncontrolled, with complications (peripheral neuropathy). He is here with his wife, who offers part of the history. Last visit 1.5 mo ago.  Last hemoglobin A1c was: Lab Results  Component Value Date   HGBA1C 11.4* 09/23/2012  Prev. 8.6%.  Now he is on: U500 insulin: 0.1 mL (50 actual units) with breakfast 0.12 mL with lunch (60 actual units) 0.15 mL (75 actual units) with dinner. Metformin 500 mg bid  Pt checks his sugars 4 a day and brings a detailed log, which we reviewed together: - am: 300-360 >> 300-400 >> 360s-420s >> 336-508 - 2h after b'fast: 390s >> not checking - before lunch: 360s-480s >> 291-458 - late afternoon and dinnertime: 360s-502 (max) >> 267-300 No lows lately; he has hypoglycemia awareness at 70.   Pt does not have chronic kidney disease, last BUN/creatinine was:  Lab Results  Component Value Date   BUN 15 09/23/2012   CREATININE 0.8 09/23/2012   Last set of lipids: Lab Results  Component Value Date   CHOL 162 07/29/2012   HDL 43.80 07/29/2012   LDLCALC 80 07/29/2012   LDLDIRECT 72.9 11/01/2010   TRIG 191.0* 07/29/2012   CHOLHDL 4 07/29/2012   Pt's last eye exam was 08/2012. No DR reportedly; had cataracts. Denies numbness and tingling in his legs.   He also has a history of mild transaminitis, HTN- on Diovan HCT, Amlodipine, HL - on Welchol, obesity, depression, hypogonadism, OSA - compliant with CPAP.   At one of the last visits, I referred him to nutrition although,he did not feel motivated to change his diet, as he gets angry when hungry. He also mentioned that he did not expect to live more than about 7-8 more years - he has had a good life and does not feel the desire to change anything to increase his lifespan further...  ROS: Constitutional: no weight gain/loss, + fatigue, + hot  flushes Eyes: no blurry vision, no xerophthalmia ENT: no sore throat, no nodules palpated in throat, no dysphagia/odynophagia, no hoarseness Cardiovascular: no CP/+ SOB/+ palpitations/leg swelling Respiratory: + cough/+ SOB Gastrointestinal: + N/no V/+ D/no C/no GERD Musculoskeletal: no muscle/joint aches Skin: no rashes Neurological: no tremors/numbness/tingling/dizziness, + HA  I reviewed pt's medications, allergies, PMH, social hx, family hx and no changes required, except as mentioned above.   PE: BP 120/62  Pulse 93  Temp(Src) 98.1 F (36.7 C) (Oral)  Resp 12  Wt 335 lb (151.955 kg)  BMI 49.45 kg/m2  SpO2 95% Wt Readings from Last 3 Encounters:  10/19/12 335 lb (151.955 kg)  09/23/12 327 lb (148.326 kg)  09/09/12 328 lb (148.78 kg)   Constitutional: overweight, in NAD, large neck, pale; flat affect Eyes: PERRLA, EOMI, no exophthalmos ENT: moist mucous membranes, no thyromegaly, no cervical lymphadenopathy, Mallampati 3.5 Cardiovascular: RRR, No MRG Respiratory: CTA B Gastrointestinal: abdomen soft, NT, ND, BS+ Musculoskeletal: no deformities, strength intact in all 4 Skin: moist, warm, no rashes  ASSESSMENT: 1. DM2, insulin-dependent, uncontrolled, with complications - peripheral neuropathy  PLAN:   1. Patient with poor diabetes control, on U500 insulin regimen now.  - we started on a conservative regimen of U500 at last visit, will need to increase doses since no lows. - I advised switch to the following regimen: 0.15 mL (75 actual units) with breakfast 0.17 mL (85 actual units) with lunch  0.18 mL (90 actual units) with dinner. Increase Metformin to 1000 mg 2x daily. - I was see him back in 1 month with his sugar log - refuses a flu vaccine today

## 2012-10-19 NOTE — Patient Instructions (Signed)
Please increase the U500 insulin doses to: 0.15 mL (75 actual units) with breakfast 0.17 mL (85 actual units) with lunch  0.18 mL (90 actual units) with dinner. Increase Metformin to 1000 mg 2x daily. Please return ina month with your sugar log. Please let me know in 1 week if sugars not better or if <90.

## 2012-10-20 ENCOUNTER — Ambulatory Visit: Payer: BC Managed Care – PPO | Admitting: *Deleted

## 2012-11-09 ENCOUNTER — Ambulatory Visit: Payer: BC Managed Care – PPO | Admitting: Internal Medicine

## 2012-11-16 ENCOUNTER — Other Ambulatory Visit (INDEPENDENT_AMBULATORY_CARE_PROVIDER_SITE_OTHER): Payer: BC Managed Care – PPO | Admitting: Internal Medicine

## 2012-11-16 ENCOUNTER — Ambulatory Visit (INDEPENDENT_AMBULATORY_CARE_PROVIDER_SITE_OTHER): Payer: BC Managed Care – PPO | Admitting: Internal Medicine

## 2012-11-16 ENCOUNTER — Encounter: Payer: Self-pay | Admitting: Internal Medicine

## 2012-11-16 VITALS — BP 124/66 | HR 89 | Temp 98.4°F | Resp 12 | Wt 332.0 lb

## 2012-11-16 DIAGNOSIS — Z23 Encounter for immunization: Secondary | ICD-10-CM

## 2012-11-16 DIAGNOSIS — E1142 Type 2 diabetes mellitus with diabetic polyneuropathy: Secondary | ICD-10-CM

## 2012-11-16 DIAGNOSIS — E1149 Type 2 diabetes mellitus with other diabetic neurological complication: Secondary | ICD-10-CM

## 2012-11-16 NOTE — Patient Instructions (Addendum)
Please change the U500 insulin doses as follows: 0.15 mL (75 actual units) with breakfast >> 0.20 mL 0.17 mL (85 actual units) with lunch >> 0.20 mL 0.18 mL (90 actual units) with dinner >> 0.22 mL Increase Metformin to 1000 mg 2x daily. Please return in 3 weeks with your sugar log.

## 2012-11-16 NOTE — Progress Notes (Signed)
Patient ID: Billy Hale, male   DOB: 1950-05-28, 62 y.o.   MRN: [id number]  HPI: Billy Hale is a 62 y.o.-year-old male, returning for DM2, dx 2004, insulin-dependent, uncontrolled, with complications (peripheral neuropathy). He is here with his wife, who offers part of the history. Last visit 1 mo ago.  Last hemoglobin A1c was: Lab Results  Component Value Date   HGBA1C 11.4* 09/23/2012   HGBA1C 8.5* 06/25/2012   HGBA1C 8.6* 04/30/2011   Now he is on: U500 insulin: 0.15 mL (75 actual units) with breakfast << 0.1 mL (50 actual units) with breakfast 0.17 mL (85 actual units) with lunch << 0.12 mL with lunch (60 actual units) 0.18 mL (90 actual units) with dinner << 0.15 mL (75 actual units) with dinner. Metformin 500 mg bid, but was taking 1000 bid up to 1 week ago (sugars a little better then), but he switched as he could not remember the dose that I recommended (which was 1000 bid)  Pt checks his sugars 3-4 a day and brings a detailed log, which we reviewed together: - am: 300-360 >> 300-400 >> 360s-420s >> 336-508 >> 267-508 - 2h after b'fast: 390s >> not checking - before lunch: 360s-480s >> 291-458 >> 275-458 - late afternoon and dinnertime: 360s-502 (max) >> 267-300 >> 263-399  No lows lately; he has hypoglycemia awareness at 70.   Pt does not have chronic kidney disease, last BUN/creatinine was:  Lab Results  Component Value Date   BUN 15 09/23/2012   CREATININE 0.8 09/23/2012   Last set of lipids: Lab Results  Component Value Date   CHOL 162 07/29/2012   HDL 43.80 07/29/2012   LDLCALC 80 07/29/2012   LDLDIRECT 72.9 11/01/2010   TRIG 191.0* 07/29/2012   CHOLHDL 4 07/29/2012   Pt's last eye exam was 08/2012. No DR reportedly; had cataracts. Denies numbness and tingling in his legs.   He also has a history of mild transaminitis, HTN- on Diovan HCT, Amlodipine, HL - on Welchol, obesity, depression, hypogonadism, OSA - compliant with CPAP.   At one of the last visits, I referred him  to nutrition although,he did not feel motivated to change his diet.  I reviewed pt's medications, allergies, PMH, social hx, family hx and no changes required, except as mentioned above.  ROS: Constitutional: no weight gain/loss, + fatigue, + hot flushes Eyes: no blurry vision, no xerophthalmia ENT: no sore throat, no nodules palpated in throat, no dysphagia/odynophagia, no hoarseness Cardiovascular: no CP/SOB/palpitations/leg swelling Respiratory: no cough/SOB Gastrointestinal: + N/no V/+ D/no C/+ GERD Musculoskeletal: no muscle/joint aches Skin: no rashes Neurological: no tremors/numbness/tingling/dizziness, + HA Low libido  I reviewed pt's medications, allergies, PMH, social hx, family hx and no changes required, except as mentioned above.   PE: BP 124/66  Pulse 89  Temp(Src) 98.4 F (36.9 C) (Oral)  Resp 12  Wt 332 lb (150.594 kg)  BMI 49.01 kg/m2  SpO2 96% Wt Readings from Last 3 Encounters:  11/16/12 332 lb (150.594 kg)  10/19/12 335 lb (151.955 kg)  09/23/12 327 lb (148.326 kg)   Constitutional: obese, in NAD, large neck, pale; flat affect Eyes: PERRLA, EOMI, no exophthalmos ENT: moist mucous membranes, no thyromegaly, no cervical lymphadenopathy, Mallampati 3.5 Cardiovascular: RRR, No MRG Respiratory: CTA B Gastrointestinal: abdomen soft, NT, ND, BS+ Musculoskeletal: no deformities, strength intact in all 4 Skin: moist, warm, no rashes  ASSESSMENT: 1. DM2, insulin-dependent, uncontrolled, with complications - peripheral neuropathy  PLAN:   1. Patient with poor diabetes control, on U500  insulin regimen now, but still with high sugars (no lows). - I advised increase the medication as follows regimen: Patient Instructions  Please change the U500 insulin doses as follows: 0.15 mL (75 actual units) with breakfast >> 0.20 mL 0.17 mL (85 actual units) with lunch >> 0.20 mL 0.18 mL (90 actual units) with dinner >> 0.22 mL Increase Metformin to 1000 mg 2x  daily. - pt with transaminitis (U/S: fatty liver) >> will increase Metformin and check LFTs at next visit.  - Please return in 3 weeks with your sugar log.  - will give a flu vaccine today

## 2012-12-07 ENCOUNTER — Ambulatory Visit (INDEPENDENT_AMBULATORY_CARE_PROVIDER_SITE_OTHER): Payer: BC Managed Care – PPO | Admitting: Internal Medicine

## 2012-12-07 ENCOUNTER — Encounter: Payer: Self-pay | Admitting: Internal Medicine

## 2012-12-07 VITALS — BP 124/82 | HR 96 | Temp 98.8°F | Resp 12 | Wt 334.7 lb

## 2012-12-07 DIAGNOSIS — E1142 Type 2 diabetes mellitus with diabetic polyneuropathy: Secondary | ICD-10-CM

## 2012-12-07 DIAGNOSIS — E1149 Type 2 diabetes mellitus with other diabetic neurological complication: Secondary | ICD-10-CM

## 2012-12-07 MED ORDER — INSULIN REGULAR HUMAN (CONC) 500 UNIT/ML ~~LOC~~ SOLN: SUBCUTANEOUS | Status: DC

## 2012-12-07 NOTE — Progress Notes (Signed)
Patient ID: Billy Hale, male   DOB: [birth year], 62 y.o.   MRN: [id number]  HPI: Billy Hale is a 62 y.o.-year-old male, returning for DM2, dx 2004, insulin-dependent, uncontrolled, with complications (peripheral neuropathy). He is here with his wife, who offers part of the history. Last visit 1 mo ago.  Last hemoglobin A1c was: Lab Results  Component Value Date   HGBA1C 11.4* 09/23/2012   HGBA1C 8.5* 06/25/2012   HGBA1C 8.6* 04/30/2011   Now he is on: - U500 insulin: 0.20 << 0.15 mL (75 actual units) with breakfast << 0.1 mL (50 actual units) with breakfast 0.20 << 0.17 mL (85 actual units) with lunch << 0.12 mL with lunch (60 actual units) 0.20 << 0.18 mL (90 actual units) with dinner << 0.15 mL (75 actual units) with dinner. - Metformin 1000 bid   He tells me he is very hungry and ill-tempered on this regimen.  Pt checks his sugars 3-4 a day and brings a detailed log, which we reviewed together - sugars better: - am: 300-360 >> 300-400 >> 360s-420s >> 336-508 >> 267-508 >> 220-270 - 2h after b'fast: 390s >> not checking - before lunch: 360s-480s >> 291-458 >> 275-458 >> 250-378 - late afternoon and dinnertime: 360s-502 (max) >> 267-300 >> 263-399 >> 179-290 - bedtime: 240-318 No lows lately; he has hypoglycemia awareness at 70.   Pt does not have chronic kidney disease, last BUN/creatinine was:  Lab Results  Component Value Date   BUN 15 09/23/2012   CREATININE 0.8 09/23/2012   Last set of lipids: Lab Results  Component Value Date   CHOL 162 07/29/2012   HDL 43.80 07/29/2012   LDLCALC 80 07/29/2012   LDLDIRECT 72.9 11/01/2010   TRIG 191.0* 07/29/2012   CHOLHDL 4 07/29/2012   Pt's last eye exam was 08/2012. No DR reportedly; had cataracts.  Denies numbness and tingling in his legs.   He also has a history of mild transaminitis, HTN- on Diovan HCT, Amlodipine, HL - on Welchol, obesity, depression, hypogonadism, OSA - compliant with CPAP.   At one of the last visits, I referred  him to nutrition although,he did not feel motivated to change his diet.  I reviewed pt's medications, allergies, PMH, social hx, family hx and no changes required, except as mentioned above.  ROS: Constitutional: no weight gain/loss, + fatigue, + hot flushes Eyes: no blurry vision, no xerophthalmia ENT: no sore throat, no nodules palpated in throat, no dysphagia/odynophagia, no hoarseness Cardiovascular: no CP/SOB/palpitations/leg swelling Respiratory: + cough/no SOB Gastrointestinal: + N/+V/+ D/no C/+ GERD Musculoskeletal: no muscle/joint aches Skin: no rashes Neurological: no tremors/numbness/tingling/dizziness, + HA  I reviewed pt's medications, allergies, PMH, social hx, family hx and no changes required, except as mentioned above.   PE: BP 124/82  Pulse 96  Temp(Src) 98.8 F (37.1 C) (Oral)  Resp 12  Wt 334 lb 11.2 oz (151.819 kg)  SpO2 95% Wt Readings from Last 3 Encounters:  12/07/12 334 lb 11.2 oz (151.819 kg)  11/16/12 332 lb (150.594 kg)  10/19/12 335 lb (151.955 kg)   Constitutional: obese, in NAD, large neck, pale Eyes: PERRLA, EOMI, no exophthalmos ENT: moist mucous membranes, no thyromegaly, no cervical lymphadenopathy, Mallampati 3.5 Cardiovascular: RRR, No MRG Respiratory: CTA B Gastrointestinal: abdomen soft, NT, ND, BS+ Musculoskeletal: no deformities, strength intact in all 4 Skin: moist, warm, no rashes  ASSESSMENT: 1. DM2, insulin-dependent, uncontrolled, with complications - peripheral neuropathy  PLAN:   1. Patient with poor diabetes control, on U500 insulin regimen now,  but still with high sugars (no lows). - I advised increase the medication as follows regimen: Patient Instructions  Please change the U500 insulin doses as follows: - 0.20 mL >> 0.22 ml before each of the meal. If sugars not better, increase to 0.25 mL before a meal - cont. Metformin to 1000 mg 2x daily. - advised to eat sugar-free snacks in between meals, if needed - pt with  transaminitis (U/S: fatty liver) >> will check LFTs at next visit along with Hba1c (per his request since he is a difficult stick) - had flu vaccine this season - Please return in 1 mo with the sugar log

## 2012-12-07 NOTE — Patient Instructions (Addendum)
Please change the U500 insulin doses as follows: - 0.20 mL >> 0.22 ml before each of the meal. If sugars not better, increase to 0.25 mg before a meal - cont. Metformin to 1000 mg 2x daily.

## 2012-12-18 ENCOUNTER — Other Ambulatory Visit: Payer: Self-pay | Admitting: Internal Medicine

## 2012-12-20 MED ORDER — ONDANSETRON HCL 4 MG PO TABS: 4.0000 mg | ORAL_TABLET | Freq: Three times a day (TID) | ORAL | Status: DC | PRN

## 2012-12-30 ENCOUNTER — Encounter: Payer: Self-pay | Admitting: Internal Medicine

## 2012-12-31 ENCOUNTER — Telehealth: Payer: Self-pay | Admitting: *Deleted

## 2012-12-31 NOTE — Telephone Encounter (Signed)
Dr Elvera Lennox, I am scheduled for a follow up visit with you on 18dec. We had discussed doing labs on that day as well. Would you please coordinate with Dr Artist Pais on any additional tests he might also like preformed (particularly Urine Protein Check). Thank you Joe

## 2013-01-06 ENCOUNTER — Encounter: Payer: Self-pay | Admitting: Internal Medicine

## 2013-01-06 ENCOUNTER — Ambulatory Visit (INDEPENDENT_AMBULATORY_CARE_PROVIDER_SITE_OTHER): Payer: BC Managed Care – PPO | Admitting: Internal Medicine

## 2013-01-06 VITALS — BP 116/62 | HR 97 | Temp 98.4°F | Resp 12 | Wt 336.2 lb

## 2013-01-06 DIAGNOSIS — E1149 Type 2 diabetes mellitus with other diabetic neurological complication: Secondary | ICD-10-CM

## 2013-01-06 DIAGNOSIS — E1142 Type 2 diabetes mellitus with diabetic polyneuropathy: Secondary | ICD-10-CM

## 2013-01-06 LAB — COMPREHENSIVE METABOLIC PANEL
ALT: 56 U/L — ABNORMAL HIGH (ref 0–53)
Alkaline Phosphatase: 82 U/L (ref 39–117)
BUN: 15 mg/dL (ref 6–23)
CO2: 27 mEq/L (ref 19–32)
Creatinine, Ser: 0.8 mg/dL (ref 0.4–1.5)
GFR: 111.72 mL/min (ref >60.00)
Glucose, Bld: 163 mg/dL — ABNORMAL HIGH (ref 70–99)
Total Bilirubin: 0.6 mg/dL (ref 0.3–1.2)

## 2013-01-06 LAB — LIPID PANEL
Cholesterol: 123 mg/dL (ref 0–200)
HDL: 34.4 mg/dL — ABNORMAL LOW (ref >39.00)
Triglycerides: 132 mg/dL (ref 0.0–149.0)
VLDL: 26.4 mg/dL (ref 0.0–40.0)

## 2013-01-06 MED ORDER — "INSULIN SYRINGE-NEEDLE U-100 30G X 5/16"" 1 ML MISC": 1.0000 | Freq: Two times a day (BID) | Status: DC

## 2013-01-06 NOTE — Patient Instructions (Signed)
Please change the U500 insulin doses as follows: - increase U500 with breakfast and lunch to 0.27 mL and keep the insulin with dinner at 0.25 units - inject 30 min before a meal - cont. Metformin to 1000 mg 2x daily Please come back for a follow-up appointment in 2 months. Please stop at the lab.

## 2013-01-06 NOTE — Progress Notes (Signed)
Patient ID: Billy Hale, male   DOB: 15-Aug-1950, 62 y.o.    MRN: [id number]  HPI: Billy Hale is a 62 y.o.-year-old male, returning for DM2, dx 2004, insulin-dependent, uncontrolled, with complications (peripheral neuropathy). He is here with his wife, who offers part of the history. Last visit 1 mo ago.  Last hemoglobin A1c was: Lab Results  Component Value Date   HGBA1C 11.4* 09/23/2012   HGBA1C 8.5* 06/25/2012   HGBA1C 8.6* 04/30/2011   Now he is on: - U500 insulin: 0.25 << 0.20 << 0.15 mL << 0.1 mL with breakfast 0.25 << 0.20 << 0.17 mL << 0.12 mL with lunch  0.25 << 0.20 << 0.18 mL << 0.15 mL with dinner - Metformin 1000 bid   Pt checks his sugars 3-4 a day and brings a detailed log, which we reviewed together - sugars better: - am: 300-360 >> 300-400 >> 360s-420s >> 336-508 >> 267-508 >> 220-270 >> 109-240 - 2h after b'fast: 390s >> not checking >> n/c - before lunch: 360s-480s >> 291-458 >> 275-458 >> 250-378 >> 228-279 - late afternoon and dinnertime: 360s-502 (max) >> 267-300 >> 263-399 >> 179-290 >> 130-257 - bedtime: 240-318 >> n/c No lows lately; he has hypoglycemia awareness at 70.   Pt does not have chronic kidney disease, last BUN/creatinine was:  Lab Results  Component Value Date   BUN 15 09/23/2012   CREATININE 0.8 09/23/2012   Last set of lipids: Lab Results  Component Value Date   CHOL 162 07/29/2012   HDL 43.80 07/29/2012   LDLCALC 80 07/29/2012   LDLDIRECT 72.9 11/01/2010   TRIG 191.0* 07/29/2012   CHOLHDL 4 07/29/2012   Pt's last eye exam was 08/2012. No DR reportedly; had cataracts.  Denies numbness and tingling in his legs.   He also has a history of mild transaminitis, HTN- on Diovan HCT, Amlodipine, HL - on Welchol, obesity, depression, hypogonadism, OSA - compliant with CPAP.   At one of the last visits, I referred him to nutrition although,he did not feel motivated to change his diet.  I reviewed pt's medications, allergies, PMH, social hx, family hx and  no changes required, except as mentioned above.  ROS: Constitutional: no weight gain/loss, + fatigue, + hot flushes Eyes: no blurry vision, no xerophthalmia ENT: no sore throat, no nodules palpated in throat, no dysphagia/odynophagia, no hoarseness Cardiovascular: no CP/SOB/palpitations/leg swelling Respiratory: + cough/no SOB/+ wheezing Gastrointestinal: + N/no V/+ D/no C/+ GERD Musculoskeletal: + both  muscle/joint aches Skin: no rashes Neurological: no tremors/numbness/tingling/dizziness, + HA Low libido  I reviewed pt's medications, allergies, PMH, social hx, family hx and no changes required, except as mentioned above.   PE: BP 116/62  Pulse 97  Temp(Src) 98.4 F (36.9 C) (Oral)  Resp 12  Wt 336 lb 3.2 oz (152.499 kg)  SpO2 95% Wt Readings from Last 3 Encounters:  01/06/13 336 lb 3.2 oz (152.499 kg)  12/07/12 334 lb 11.2 oz (151.819 kg)  11/16/12 332 lb (150.594 kg)   Constitutional: obese, in NAD, large neck, pale Eyes: PERRLA, EOMI, no exophthalmos ENT: moist mucous membranes, no thyromegaly, no cervical lymphadenopathy, Mallampati 3.5 Cardiovascular: RRR, No MRG Respiratory: CTA B Gastrointestinal: abdomen soft, NT, ND, BS+ Musculoskeletal: no deformities, strength intact in all 4 Skin: moist, warm, no rashes  ASSESSMENT: 1. DM2, insulin-dependent, uncontrolled, with complications - peripheral neuropathy  PLAN:   1. Patient with poor diabetes control, on U500 insulin regimen, with improved sugars recently. - I advised increase the medication as follows regimen:  Patient Instructions  Please change the U500 insulin doses as follows: - increase U500 with breakfast and lunch to 0.27 mL and keep the insulin with dinner at 0.25 units - inject 30 min before a meal - cont. Metformin to 1000 mg 2x daily. - pt with transaminitis (U/S: fatty liver) >> will check LFTs today, along with Hba1c, lipids, ACR - had flu vaccine this season - needs refill of Metformin 1000  mg for 3 mo >> [email or web address] - RTC in 2 mo with the sugar log  Orders Placed This Encounter  Procedures  . Comp Met (CMET)  . Lipid Profile  . HgB A1c  . Urine Microalbumin w/creat. ratio    Office Visit on 01/06/2013  Component Date Value Range Status  . Sodium 01/06/2013 139  135 - 145 mEq/L Final  . Potassium 01/06/2013 4.3  3.5 - 5.1 mEq/L Final  . Chloride 01/06/2013 104  96 - 112 mEq/L Final  . CO2 01/06/2013 27  19 - 32 mEq/L Final  . Glucose, Bld 01/06/2013 163* 70 - 99 mg/dL Final  . BUN 16/10/9602 15  6 - 23 mg/dL Final  . Creatinine, Ser 01/06/2013 0.8  0.4 - 1.5 mg/dL Final  . Total Bilirubin 01/06/2013 0.6  0.3 - 1.2 mg/dL Final  . Alkaline Phosphatase 01/06/2013 82  39 - 117 U/L Final  . AST 01/06/2013 61* 0 - 37 U/L Final  . ALT 01/06/2013 56* 0 - 53 U/L Final  . Total Protein 01/06/2013 7.5  6.0 - 8.3 g/dL Final  . Albumin 54/09/8117 4.0  3.5 - 5.2 g/dL Final  . Calcium 14/78/2956 9.3  8.4 - 10.5 mg/dL Final  . GFR 21/30/8657 111.72  >60.00 mL/min Final  . Cholesterol 01/06/2013 123  0 - 200 mg/dL Final   ATP III Classification       Desirable:  < 200 mg/dL               Borderline High:  200 - 239 mg/dL          High:  > = 846 mg/dL  . Triglycerides 01/06/2013 132.0  0.0 - 149.0 mg/dL Final   Normal:  <962 mg/dLBorderline High:  150 - 199 mg/dL  . HDL 01/06/2013 34.40* >39.00 mg/dL Final  . VLDL 95/28/4132 26.4  0.0 - 40.0 mg/dL Final  . LDL Cholesterol 01/06/2013 62  0 - 99 mg/dL Final  . Total CHOL/HDL Ratio 01/06/2013 4   Final                  Men          Women1/2 Average Risk     3.4          3.3Average Risk          5.0          4.42X Average Risk          9.6          7.13X Average Risk          15.0          11.0                      . Hemoglobin A1C 01/06/2013 9.6* 4.6 - 6.5 % Final   Glycemic Control Guidelines for People with Diabetes:Non Diabetic:  <6%Goal of Therapy: <7%Additional Action Suggested:  >8%   ACR pending.  Msg sent; Dear  Billy Hale, Everything looks better: your Hba1c, the cholesterol  and the liver tests! Sincerely, [name] MD

## 2013-01-07 ENCOUNTER — Other Ambulatory Visit: Payer: Self-pay | Admitting: Internal Medicine

## 2013-01-24 ENCOUNTER — Other Ambulatory Visit: Payer: Self-pay | Admitting: *Deleted

## 2013-01-24 MED ORDER — INSULIN REGULAR HUMAN (CONC) 500 UNIT/ML ~~LOC~~ SOLN: SUBCUTANEOUS | Status: DC

## 2013-02-13 ENCOUNTER — Telehealth: Payer: Self-pay | Admitting: Internal Medicine

## 2013-02-13 NOTE — Telephone Encounter (Signed)
[facility] requesting refill of glucose blood (ONETOUCH VERIO) test strip

## 2013-02-14 ENCOUNTER — Other Ambulatory Visit: Payer: Self-pay | Admitting: *Deleted

## 2013-02-14 MED ORDER — GLUCOSE BLOOD VI STRP: ORAL_STRIP | Status: DC

## 2013-02-14 NOTE — Telephone Encounter (Signed)
Diabetes is managed by Dr Elvera LennoxGherghe now.  Refill request forwarded to Dr Elvera LennoxGherghe

## 2013-02-14 NOTE — Telephone Encounter (Signed)
Test strip refill done.

## 2013-02-14 NOTE — Telephone Encounter (Signed)
OK to refill

## 2013-03-15 ENCOUNTER — Ambulatory Visit: Payer: BC Managed Care – PPO | Admitting: Internal Medicine

## 2013-03-16 ENCOUNTER — Other Ambulatory Visit: Payer: BC Managed Care – PPO

## 2013-03-23 ENCOUNTER — Encounter: Payer: BC Managed Care – PPO | Admitting: Internal Medicine

## 2013-03-27 ENCOUNTER — Other Ambulatory Visit: Payer: Self-pay | Admitting: Internal Medicine

## 2013-04-07 ENCOUNTER — Encounter: Payer: Self-pay | Admitting: Internal Medicine

## 2013-04-07 ENCOUNTER — Ambulatory Visit (INDEPENDENT_AMBULATORY_CARE_PROVIDER_SITE_OTHER): Payer: BC Managed Care – PPO | Admitting: Internal Medicine

## 2013-04-07 VITALS — BP 126/62 | HR 109 | Temp 98.8°F | Resp 16 | Wt 339.8 lb

## 2013-04-07 DIAGNOSIS — E1149 Type 2 diabetes mellitus with other diabetic neurological complication: Secondary | ICD-10-CM

## 2013-04-07 MED ORDER — INSULIN REGULAR HUMAN (CONC) 500 UNIT/ML ~~LOC~~ SOLN: SUBCUTANEOUS | Status: DC

## 2013-04-07 NOTE — Patient Instructions (Signed)
Please stay on the current U500 and Metformin regimen. Please return in 3 months with your sugar log.

## 2013-04-07 NOTE — Progress Notes (Signed)
Patient ID: Billy Hale, male   DOB: 09/28/1950, 63 y.o.   MRN: [id number]  HPI: Billy Hale is a 63 y.o.-year-old male, returning for DM2, dx 2004, insulin-dependent, uncontrolled, with complications (peripheral neuropathy). Last visit 3 mo ago.  Last hemoglobin A1c was: Lab Results  Component Value Date   HGBA1C 9.6* 01/06/2013   HGBA1C 11.4* 09/23/2012   HGBA1C 8.5* 06/25/2012   He is on: - U500 insulin - he may forget to take it 30 min before a meal and may take it after eating!: 0.27 << 0.25 << 0.20 << 0.15 mL << 0.1 mL with breakfast 0.27 << 0.25 << 0.20 << 0.17 mL << 0.12 mL with lunch  0.27 << 0.25 << 0.20 << 0.18 mL << 0.15 mL with dinner - Metformin 1000 bid   Pt checks his sugars 3-4 a day - forgot log - sugars are a little better: - am: 300-360 >> 300-400 >> 360s-420s >> 336-508 >> 267-508 >> 220-270 >> 109-240 ?>> 42 x 1 (6 weeks ago), 74 x 1, 90-189 - 2h after b'fast: 390s >> not checking >> n/c - before lunch: 360s-480s >> 291-458 >> 275-458 >> 250-378 >> 228-279 >> cannot remember - late afternoon and dinnertime: 360s-502 (max) >> 267-300 >> 263-399 >> 179-290 >> 130-257 >> 200-210 - bedtime: 240-318 >> n/c >> 280-390 (if forgets insulin before a meal) He had a 42 in am 6 weeks ago; he has hypoglycemia awareness at 70.   Pt does not have chronic kidney disease, last BUN/creatinine was:  Lab Results  Component Value Date   BUN 15 01/06/2013   CREATININE 0.8 01/06/2013  He is on Valsartan. Last set of lipids: Lab Results  Component Value Date   CHOL 123 01/06/2013   HDL 34.40* 01/06/2013   LDLCALC 62 01/06/2013   LDLDIRECT 72.9 11/01/2010   TRIG 132.0 01/06/2013   CHOLHDL 4 01/06/2013  He is on Simvastatin. Pt's last eye exam was 08/2012. No DR reportedly; had cataracts.  Denies numbness and tingling in his legs.   He also has a history of mild transaminitis, HTN- on Diovan HCT, Amlodipine, HL - on statin, obesity, depression, hypogonadism, OSA - compliant with  CPAP.   At one of the last visits, I referred him to nutrition although, he did not feel motivated to change his diet.  I reviewed pt's medications, allergies, PMH, social hx, family hx and no changes required, except as mentioned above.  ROS: Constitutional: + weight gain, no fatigue, + hot flushes Eyes: no blurry vision, no xerophthalmia ENT: no sore throat, no nodules palpated in throat, no dysphagia/odynophagia, no hoarseness Cardiovascular: no CP/SOB/palpitations/leg swelling Respiratory: no cough/no SOB Gastrointestinal: no N/no V/+ D/no C Musculoskeletal: + both  muscle/joint aches Skin: no rashes Neurological: no tremors/numbness/tingling/dizziness Low libido  PE: BP 126/62  Pulse 109  Temp(Src) 98.8 F (37.1 C) (Oral)  Resp 16  Wt 339 lb 12.8 oz (154.132 kg)  SpO2 95% Wt Readings from Last 3 Encounters:  04/07/13 339 lb 12.8 oz (154.132 kg)  01/06/13 336 lb 3.2 oz (152.499 kg)  12/07/12 334 lb 11.2 oz (151.819 kg)   Constitutional: obese, in NAD, large neck, pale Eyes: PERRLA, EOMI, no exophthalmos ENT: moist mucous membranes, no thyromegaly, no cervical lymphadenopathy, Mallampati 3.5 Cardiovascular: RRR, No MRG Respiratory: CTA B Gastrointestinal: abdomen soft, NT, ND, BS+ Musculoskeletal: no deformities, strength intact in all 4 Skin: moist, warm, no rashes  ASSESSMENT: 1. DM2, insulin-dependent, uncontrolled, with complications and increased insulin resistance - peripheral neuropathy  PLAN:   1. Patient with poor diabetes control, on U500 insulin regimen, with improved sugars recently. - I advised increase the medication as follows regimen: Patient Instructions  Please stay on the current U500 and Metformin regimen. Please return in 3 months with your sugar log.  - he may skip U500 doses (not often) but more often he takes them after he eats as he forgets >> advised him to try to take them 30 min before he eats, if he forgets and remembers after he  eats, he may take 1/2 dose, not the whole dose, especially after dinner - advised him to try to limit after dinner snacking - had flu vaccine this season - up to date with eye exams - we need an A1c- he will have labs in PCP's office in 2 weeks >> ordered HbA1c as a future order for then - RTC in 2 mo with the sugar log

## 2013-04-29 ENCOUNTER — Encounter: Payer: BC Managed Care – PPO | Admitting: Internal Medicine

## 2013-05-05 ENCOUNTER — Other Ambulatory Visit (INDEPENDENT_AMBULATORY_CARE_PROVIDER_SITE_OTHER): Payer: BC Managed Care – PPO

## 2013-05-05 DIAGNOSIS — Z0000 Encounter for general adult medical examination without abnormal findings: Secondary | ICD-10-CM

## 2013-05-05 LAB — PSA: PSA: 0.6 ng/mL (ref 0.10–4.00)

## 2013-05-05 LAB — POCT URINALYSIS DIPSTICK
BILIRUBIN UA: NEGATIVE
Ketones, UA: NEGATIVE
Nitrite, UA: NEGATIVE
RBC UA: NEGATIVE
Spec Grav, UA: 1.025
UROBILINOGEN UA: 1
pH, UA: 6

## 2013-05-05 LAB — BASIC METABOLIC PANEL
BUN: 16 mg/dL (ref 6–23)
CHLORIDE: 102 meq/L (ref 96–112)
CO2: 23 meq/L (ref 19–32)
Calcium: 9.3 mg/dL (ref 8.4–10.5)
Creatinine, Ser: 0.6 mg/dL (ref 0.4–1.5)
GFR: 144.38 mL/min (ref >60.00)
Glucose, Bld: 270 mg/dL — ABNORMAL HIGH (ref 70–99)
POTASSIUM: 4.1 meq/L (ref 3.5–5.1)
Sodium: 135 mEq/L (ref 135–145)

## 2013-05-05 LAB — LIPID PANEL
CHOL/HDL RATIO: 3
Cholesterol: 137 mg/dL (ref 0–200)
HDL: 39.6 mg/dL (ref >39.00)
LDL CALC: 68 mg/dL (ref 0–99)
Triglycerides: 147 mg/dL (ref 0.0–149.0)
VLDL: 29.4 mg/dL (ref 0.0–40.0)

## 2013-05-05 LAB — CBC WITH DIFFERENTIAL/PLATELET
BASOS PCT: 0.2 % (ref 0.0–3.0)
Basophils Absolute: 0 10*3/uL (ref 0.0–0.1)
EOS PCT: 7.3 % — AB (ref 0.0–5.0)
Eosinophils Absolute: 0.5 10*3/uL (ref 0.0–0.7)
HCT: 41.3 % (ref 39.0–52.0)
HEMOGLOBIN: 14.3 g/dL (ref 13.0–17.0)
Lymphocytes Relative: 25.2 % (ref 12.0–46.0)
Lymphs Abs: 1.8 10*3/uL (ref 0.7–4.0)
MCHC: 34.7 g/dL (ref 30.0–36.0)
MCV: 98.5 fl (ref 78.0–100.0)
MONO ABS: 0.8 10*3/uL (ref 0.1–1.0)
Monocytes Relative: 11.5 % (ref 3.0–12.0)
NEUTROS PCT: 55.8 % (ref 43.0–77.0)
Neutro Abs: 4 10*3/uL (ref 1.4–7.7)
Platelets: 184 10*3/uL (ref 150.0–400.0)
RBC: 4.19 Mil/uL — AB (ref 4.22–5.81)
RDW: 13.4 % (ref 11.5–14.6)
WBC: 7.1 10*3/uL (ref 4.5–10.5)

## 2013-05-05 LAB — HEPATIC FUNCTION PANEL
ALK PHOS: 114 U/L (ref 39–117)
ALT: 70 U/L — AB (ref 0–53)
AST: 79 U/L — ABNORMAL HIGH (ref 0–37)
Albumin: 3.3 g/dL — ABNORMAL LOW (ref 3.5–5.2)
Bilirubin, Direct: 0.1 mg/dL (ref 0.0–0.3)
Total Bilirubin: 0.7 mg/dL (ref 0.3–1.2)
Total Protein: 7 g/dL (ref 6.0–8.3)

## 2013-05-05 LAB — MICROALBUMIN / CREATININE URINE RATIO
Creatinine,U: 124.5 mg/dL
Microalb Creat Ratio: 24.3 mg/g (ref 0.0–30.0)
Microalb, Ur: 30.2 mg/dL — ABNORMAL HIGH (ref 0.0–1.9)

## 2013-05-05 LAB — TSH: TSH: 3.76 u[IU]/mL (ref 0.35–5.50)

## 2013-05-05 LAB — HEMOGLOBIN A1C: HEMOGLOBIN A1C: 8.5 % — AB (ref 4.6–6.5)

## 2013-05-13 ENCOUNTER — Ambulatory Visit (INDEPENDENT_AMBULATORY_CARE_PROVIDER_SITE_OTHER): Payer: BC Managed Care – PPO | Admitting: Internal Medicine

## 2013-05-13 ENCOUNTER — Encounter: Payer: Self-pay | Admitting: Internal Medicine

## 2013-05-13 VITALS — BP 126/90 | HR 84 | Temp 99.3°F | Ht 68.5 in | Wt 335.0 lb

## 2013-05-13 DIAGNOSIS — R740 Nonspecific elevation of levels of transaminase and lactic acid dehydrogenase [LDH]: Secondary | ICD-10-CM

## 2013-05-13 DIAGNOSIS — E1149 Type 2 diabetes mellitus with other diabetic neurological complication: Secondary | ICD-10-CM

## 2013-05-13 DIAGNOSIS — G4733 Obstructive sleep apnea (adult) (pediatric): Secondary | ICD-10-CM | POA: Insufficient documentation

## 2013-05-13 DIAGNOSIS — I10 Essential (primary) hypertension: Secondary | ICD-10-CM

## 2013-05-13 DIAGNOSIS — Z0000 Encounter for general adult medical examination without abnormal findings: Secondary | ICD-10-CM | POA: Insufficient documentation

## 2013-05-13 DIAGNOSIS — R7401 Elevation of levels of liver transaminase levels: Secondary | ICD-10-CM

## 2013-05-13 NOTE — Assessment & Plan Note (Signed)
Reviewed adult health maintenance protocols.  Patient is up-to-date with adult vaccines. He should advised to obtain flu vaccine in October of 2015.  His last colonoscopy was in 2011.
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
## 2013-05-13 NOTE — Assessment & Plan Note (Signed)
Followed by Dr. Elvera LennoxGherghe.   Lab Results  Component Value Date   HGBA1C 8.5* 05/05/2013

## 2013-05-13 NOTE — Assessment & Plan Note (Signed)
Patient has long-standing history of obstructive sleep apnea. Patient has not had CPAP titration in years. His CPAP machine is greater than 64 years old. Refer to sleep specialist for reevaluation.

## 2013-05-13 NOTE — Assessment & Plan Note (Addendum)
Chronic hepatitis serologies were negative. Iron saturation within normal limits.  Liver ultrasound in July 2014 showed significant fatty liver.

## 2013-05-13 NOTE — Progress Notes (Signed)
Pre visit review using our clinic review tool, if applicable. No additional management support is needed unless otherwise documented below in the visit note. 

## 2013-05-16 ENCOUNTER — Telehealth: Payer: Self-pay | Admitting: Internal Medicine

## 2013-05-16 NOTE — Telephone Encounter (Signed)
Relevant patient education assigned to patient using Emmi. ° °

## 2013-06-01 ENCOUNTER — Ambulatory Visit (INDEPENDENT_AMBULATORY_CARE_PROVIDER_SITE_OTHER): Payer: BC Managed Care – PPO | Admitting: Pulmonary Disease

## 2013-06-01 ENCOUNTER — Encounter: Payer: Self-pay | Admitting: Pulmonary Disease

## 2013-06-01 VITALS — BP 130/72 | HR 86 | Temp 98.2°F | Ht 69.0 in | Wt 335.8 lb

## 2013-06-01 DIAGNOSIS — G4733 Obstructive sleep apnea (adult) (pediatric): Secondary | ICD-10-CM

## 2013-06-01 NOTE — Progress Notes (Signed)
Subjective:    Patient ID: Billy Hale, male    DOB: 09/01/1950, 63 y.o.   MRN: [id number]  HPI The patient is a 63 year old male who I've been asked to see for management of obstructive sleep apnea. He tells me that he was diagnosed with severe OSA 25 years ago while living in [place], and has been on CPAP since that time. He continues to use CPAP, however his current device is 64 years old, and is starting to cut off spontaneously. He uses a full face mask, and has been trying to keep up with his mask changes and supplies.  His current pressure is set at 16 cm of water, but he has gained over 50 pounds from his original study. His wife has noticed breakthrough snoring and apnea , but the patient feels rested upon arising despite this. He denies any sleepiness during the day, but will take up to 3 naps a day according to the wife. He feels that his alertness is normal, and his Epworth score today is 8.   Sleep Questionnaire What time do you typically go to bed?( Between what hours) 10pm 10pm at 1045 on 06/01/13 by Darrell JewelJennifer R Castillo, CMA How long does it take you to fall asleep? 20 minutes 20 minutes at 1045 on 06/01/13 by Darrell JewelJennifer R Castillo, CMA How many times during the night do you wake up? 10 10 at 1045 on 06/01/13 by Darrell JewelJennifer R Castillo, CMA What time do you get out of bed to start your day? 0600 0600 at 1045 on 06/01/13 by Darrell JewelJennifer R Castillo, CMA Do you drive or operate heavy machinery in your occupation? No No at 1045 on 06/01/13 by Darrell JewelJennifer R Castillo, CMA How much has your weight changed (up or down) over the past two years? (In pounds) 17 lb (7.711 kg) 17 lb (7.711 kg) at 1045 on 06/01/13 by Darrell JewelJennifer R Castillo, CMA Have you ever had a sleep study before? Yes Yes at 1045 on 06/01/13 by Darrell JewelJennifer R Castillo, CMA If yes, [facility] of study[facility] [employer] [facility] [employer] on 06/01/13 by Darrell JewelJennifer R Castillo, CMA If yes, date of study? 1998 1998 at 1045  on 06/01/13 by Darrell JewelJennifer R Castillo, CMA Do you currently use CPAP? Yes Yes at 1045 on 06/01/13 by Darrell JewelJennifer R Castillo, CMA If so, what pressure? 16 16 at 1045 on 06/01/13 by Darrell JewelJennifer R Castillo, CMA Do you wear oxygen at any time? No No at 1045 on 06/01/13 by Darrell JewelJennifer R Castillo, CMA   Review of Systems  Constitutional: Negative for fever and unexpected weight change.  HENT: Negative for congestion, dental problem, ear pain, nosebleeds, postnasal drip, rhinorrhea, sinus pressure, sneezing, sore throat and trouble swallowing.   Eyes: Negative for redness and itching.  Respiratory: Positive for cough and shortness of breath. Negative for chest tightness and wheezing.   Cardiovascular: Negative for palpitations and leg swelling.  Gastrointestinal: Negative for nausea and vomiting.  Genitourinary: Negative for dysuria.  Musculoskeletal: Negative for joint swelling.  Skin: Negative for rash.  Neurological: Positive for headaches.  Hematological: Does not bruise/bleed easily.  Psychiatric/Behavioral: Negative for dysphoric mood. The patient is not nervous/anxious.        Objective:   Physical Exam Constitutional:  Morbidly obese male, no acute distress  HENT:  Nares patent without discharge, but +septal deviation.  Oropharynx without exudate, palate and uvula are thick and elongated.  Narrowed posteriorly  Eyes:  Perrla, eomi, no scleral icterus  Neck:  No JVD, no TMG  Cardiovascular:  Normal rate, regular rhythm, no rubs or gallops.  No murmurs        Intact distal pulses  Pulmonary :  Normal breath sounds, no stridor or respiratory distress   No rales, rhonchi, or wheezing  Abdominal:  Soft, nondistended, bowel sounds present.  No tenderness noted.   Musculoskeletal:  1+ lower extremity edema noted.  Lymph Nodes:  No cervical lymphadenopathy noted  Skin:  No cyanosis noted  Neurologic:  Alert, appropriate, moves all 4 extremities without obvious deficit.          Assessment & Plan:

## 2013-06-01 NOTE — Patient Instructions (Signed)
Will get you a new cpap machine and supplies, and set the device on the auto setting.  Please call if having tolerance issues or concerns Work on weight loss. If doing well, followup with me in one year.

## 2013-06-01 NOTE — Assessment & Plan Note (Signed)
The patient has a long history of severe obstructive sleep apnea, and has been on CPAP for 25 years. His current device is over 247 years old, and is not working properly. He is also gained significant weight, and has not had optimization of his pressure. Will arrange for a new CPAP device, and will use the automatic setting in order to optimize his pressure. Will also give him a new mask and supplies. I've asked the patient to work aggressively on weight loss, and I will see him back in one year if he is doing well. We'll check a download in the interim to make sure things are going well.

## 2013-06-15 ENCOUNTER — Telehealth: Payer: Self-pay | Admitting: Pulmonary Disease

## 2013-06-15 NOTE — Telephone Encounter (Signed)
Let pt know we cannot get him new machine or supplies without a sleep study for documentation.  See if he will do home sleep test off cpap one night?

## 2013-06-15 NOTE — Telephone Encounter (Signed)
KC i have no npsg to set this pt up with any dme what do you want to do ? Tobe Sos

## 2013-06-15 NOTE — Telephone Encounter (Signed)
lmtcb Billy Hale ° °

## 2013-06-17 ENCOUNTER — Other Ambulatory Visit: Payer: Self-pay | Admitting: Pulmonary Disease

## 2013-06-17 DIAGNOSIS — G4733 Obstructive sleep apnea (adult) (pediatric): Secondary | ICD-10-CM

## 2013-06-17 NOTE — Telephone Encounter (Signed)
Order sent to [facility].  

## 2013-06-17 NOTE — Telephone Encounter (Signed)
Spoke with patient--aware of requirements for new CPAP Pt okay with having home sleep test. Will await our call to schedule.  Dr Shelle Iron please advise on order. Thanks.

## 2013-07-08 ENCOUNTER — Ambulatory Visit (INDEPENDENT_AMBULATORY_CARE_PROVIDER_SITE_OTHER): Payer: BC Managed Care – PPO | Admitting: Internal Medicine

## 2013-07-08 ENCOUNTER — Encounter: Payer: Self-pay | Admitting: Internal Medicine

## 2013-07-08 VITALS — BP 126/70 | HR 97 | Temp 98.1°F | Resp 16 | Wt 337.0 lb

## 2013-07-08 DIAGNOSIS — E1149 Type 2 diabetes mellitus with other diabetic neurological complication: Secondary | ICD-10-CM

## 2013-07-08 MED ORDER — CANAGLIFLOZIN 100 MG PO TABS: ORAL_TABLET | ORAL | Status: DC

## 2013-07-08 MED ORDER — CANAGLIFLOZIN 300 MG PO TABS: ORAL_TABLET | ORAL | Status: DC

## 2013-07-08 NOTE — Patient Instructions (Signed)
Please continue current U500 insulin doses. Add Invokana 100 mg in am before breakfast for 1 week, then increase to 300 mg. Please return in 1 month with your sugar log.

## 2013-07-08 NOTE — Progress Notes (Signed)
Patient ID: Billy PandaJoseph Pickeral, male   DOB: 02/07/1950, 63 y.o.   MRN: [id number]  HPI: Billy Hale is a 63 y.o.-year-old male, returning for DM2, dx 2004, insulin-dependent, uncontrolled, with complications (peripheral neuropathy). Last visit 3 mo ago.  Last hemoglobin A1c was: Lab Results  Component Value Date   HGBA1C 8.5* 05/05/2013   HGBA1C 9.6* 01/06/2013   HGBA1C 11.4* 09/23/2012   He is on: - U500 insulin - he may forget to take it 30 min before a meal and may take it after eating!: 0.27 << 0.25 << 0.20 << 0.15 mL << 0.1 mL with breakfast 0.27 << 0.25 << 0.20 << 0.17 mL << 0.12 mL with lunch  0.27 << 0.25 << 0.20 << 0.18 mL << 0.15 mL with dinner - Metformin 1000 bid   Pt checks his sugars 3-4 a day - forgot log - sugars are a little higher - 60 day ave: 224: - am: 220-270 >> 109-240 ?>> 42 x 1 (6 weeks ago), 74 x 1, 90-189 >> (78) 112-321 (431) - 2h after b'fast: 390s >> not checking >> n/c  - before lunch: 360s-480s >> 291-458 >> 275-458 >> 250-378 >> 228-279 >> 196 - 2h after lunch: 219 - late afternoon and dinnertime:  >> 267-300 >> 263-399 >> 179-290 >> 130-257 >> 200-210 >> 168-245 (354) - bedtime: 240-318 >> n/c >> 280-390 (if forgets insulin before a meal) >> 196-284 He had a 78 in am 2 weeks ago; he has hypoglycemia awareness at 70. His highest sugar was 431 in am (cannot explain)  Pt does not have chronic kidney disease, last BUN/creatinine was:  Lab Results  Component Value Date   BUN 16 05/05/2013   CREATININE 0.6 05/05/2013  He is on Valsartan. Last set of lipids: Lab Results  Component Value Date   CHOL 137 05/05/2013   HDL 39.60 05/05/2013   LDLCALC 68 05/05/2013   LDLDIRECT 72.9 11/01/2010   TRIG 147.0 05/05/2013   CHOLHDL 3 05/05/2013  He is on Simvastatin. Pt's last eye exam was 08/2012. No DR reportedly; had cataracts.  Denies numbness and tingling in his legs.   He also has a history of mild transaminitis, HTN- on Diovan HCT, Amlodipine, HL - on statin,  obesity, depression, hypogonadism, OSA - compliant with CPAP.   At one of the last visits, I referred him to nutrition although, he did not feel motivated to change his diet.  I reviewed pt's medications, allergies, PMH, social hx, family hx and no changes required, except as mentioned above.  ROS: Constitutional: + weight gain, no fatigue, no excessive urination but drinks 1.5 gallons of water he mentions Eyes: no blurry vision, no xerophthalmia ENT: no sore throat, no nodules palpated in throat, no dysphagia/odynophagia, no hoarseness Cardiovascular: no CP/SOB/palpitations/leg swelling Respiratory: no cough/no SOB Gastrointestinal: + N/+ V/+ D/no C Musculoskeletal: no  Muscle aches/+ joint aches Skin: no rashes Neurological: no tremors/numbness/tingling/dizziness Low libido  PE: BP 126/70  Pulse 97  Temp(Src) 98.1 F (36.7 C) (Oral)  Resp 16  Wt 337 lb (152.862 kg)  SpO2 95% Wt Readings from Last 3 Encounters:  07/08/13 337 lb (152.862 kg)  06/01/13 335 lb 12.8 oz (152.318 kg)  05/13/13 335 lb (151.955 kg)   Constitutional: obese, in NAD, large neck, pale Eyes: PERRLA, EOMI, no exophthalmos ENT: moist mucous membranes, no thyromegaly, no cervical lymphadenopathy, Mallampati 3.5 Cardiovascular: RRR, No MRG Respiratory: CTA B Gastrointestinal: abdomen soft, NT, ND, BS+ Musculoskeletal: no deformities, strength intact in all 4 Skin:  moist, warm, no rashes  ASSESSMENT: 1. DM2, insulin-dependent, uncontrolled, with complications and increased insulin resistance; on U500 - peripheral neuropathy  PLAN:   1. Patient with poor diabetes control, on U500 insulin regimen, with still suboptimal control of sugars. - I advised him to add Invokana: Patient Instructions  Please continue current U500 insulin doses. (0.27 mL tid before meals) Add Invokana 100 mg in am before breakfast for 1 week, then increase to 300 mg. Please return in 1 month with your sugar log.  - we discussed  about SEs of Invokana, which are: dizziness (advised to be careful when stands from sitting position), decreased BP - usually not < normal (BP today is not low), and fungal UTIs (advised to let me know if develops one).  - given discount card for Invokana - he may skip U500 doses (not often) but more often he takes them after he eats as he forgets - up to date with eye exams - will check A1c at next visit. Will also need a BMP since we start Invokana and he is on an ARB. - RTC in 1 mo with the sugar log

## 2013-07-19 ENCOUNTER — Other Ambulatory Visit: Payer: Self-pay | Admitting: Internal Medicine

## 2013-07-21 ENCOUNTER — Telehealth: Payer: Self-pay | Admitting: Pulmonary Disease

## 2013-07-21 DIAGNOSIS — G4733 Obstructive sleep apnea (adult) (pediatric): Secondary | ICD-10-CM

## 2013-07-21 NOTE — Telephone Encounter (Signed)
Dr Shelle Ironlance please advise  Spoke with pt's wife states they were to call to let Dr Shelle Ironlance know which kind of sleep study the patient has decided to do.  Pt has requested to do the at home study.  Requests a Wednesday or Thursday night if possible. Pt aware that Dr Shelle Ironlance not in office this afternoon, will return on Monday as we are closed tomorrow (07/22/13) Thanks!

## 2013-07-25 NOTE — Telephone Encounter (Signed)
Order has been placed to [facility]. Nothing further needed

## 2013-07-25 NOTE — Telephone Encounter (Signed)
Ok to put in for sle1005 here at [facility]

## 2013-07-28 DIAGNOSIS — G4730 Sleep apnea, unspecified: Secondary | ICD-10-CM

## 2013-07-28 DIAGNOSIS — G4710 Hypersomnia, unspecified: Secondary | ICD-10-CM

## 2013-08-01 ENCOUNTER — Other Ambulatory Visit: Payer: Self-pay | Admitting: Pulmonary Disease

## 2013-08-01 ENCOUNTER — Encounter: Payer: Self-pay | Admitting: Pulmonary Disease

## 2013-08-01 DIAGNOSIS — G4733 Obstructive sleep apnea (adult) (pediatric): Secondary | ICD-10-CM

## 2013-08-01 DIAGNOSIS — G4710 Hypersomnia, unspecified: Secondary | ICD-10-CM

## 2013-08-01 DIAGNOSIS — G4730 Sleep apnea, unspecified: Secondary | ICD-10-CM

## 2013-08-05 ENCOUNTER — Ambulatory Visit: Payer: BC Managed Care – PPO | Admitting: Internal Medicine

## 2013-08-11 ENCOUNTER — Telehealth: Payer: Self-pay | Admitting: Pulmonary Disease

## 2013-08-11 NOTE — Telephone Encounter (Signed)
Per pt spouse the pt had a home sleep study but she thinks it did not work well so she wants to know where to go from here. I advised according to pt chart and order placed by [facility] on 7-13 the study had enough data to show that he still has sleep apnea and that they need to proceed with a new machine order. The order was faxed to [facility]. I advised they should be hearing from [facility] about this. Nothing further needed at this time.

## 2013-08-19 ENCOUNTER — Ambulatory Visit (INDEPENDENT_AMBULATORY_CARE_PROVIDER_SITE_OTHER): Payer: BC Managed Care – PPO | Admitting: Internal Medicine

## 2013-08-19 ENCOUNTER — Encounter: Payer: Self-pay | Admitting: Internal Medicine

## 2013-08-19 VITALS — BP 122/64 | HR 110 | Temp 98.4°F | Resp 12 | Wt 333.0 lb

## 2013-08-19 DIAGNOSIS — E1149 Type 2 diabetes mellitus with other diabetic neurological complication: Secondary | ICD-10-CM

## 2013-08-19 LAB — BASIC METABOLIC PANEL
BUN: 17 mg/dL (ref 6–23)
CO2: 20 mEq/L (ref 19–32)
Calcium: 9.6 mg/dL (ref 8.4–10.5)
Chloride: 101 mEq/L (ref 96–112)
Creatinine, Ser: 0.8 mg/dL (ref 0.4–1.5)
GFR: 105.01 mL/min (ref >60.00)
Glucose, Bld: 222 mg/dL — ABNORMAL HIGH (ref 70–99)
POTASSIUM: 3.9 meq/L (ref 3.5–5.1)
SODIUM: 133 meq/L — AB (ref 135–145)

## 2013-08-19 LAB — HEMOGLOBIN A1C: Hgb A1c MFr Bld: 7.8 % — ABNORMAL HIGH (ref 4.6–6.5)

## 2013-08-19 NOTE — Patient Instructions (Addendum)
Please continue 0.27 mL U500 insulin before brunch and dinner. Continue Metformin 1000 mg 2x a day. Continue Invokana 300 mg daily in am. Please come back for a follow-up appointment in 3 months. Please stop at the lab.  KEEP UP THE GREAT JOB!   Diabetes and Sick Day Management Blood sugar (glucose) can be more difficult to control when you are sick. Colds, fever, flu, nausea, vomiting, and diarrhea are all examples of common illnesses that can cause problems for people with diabetes. Loss of body fluids (dehydration) from fever, vomiting, diarrhea, infection, and the stress of a sickness can all cause blood glucose levels to increase. Because of this, it is very important to take your diabetes medicines and to eat some form of carbohydrate food when you are sick. Liquid or soft foods are often tolerated, and they help to replace fluids. HOME CARE INSTRUCTIONS These main guidelines are intended for managing a short-term (24 hours or less) sickness:  Take your usual dose of insulin or oral diabetes medicine. An exception would be if you take any form of metformin. If you cannot eat or drink, you can become dehydrated and should not take this medicine.  Continue to take your insulin even if you are unable to eat solid foods or are vomiting. Your insulin dose may stay the same, or it may need to be increased when you are sick.  You will need to test your blood glucose more often, generally every 2-4 hours. If you have type 1 diabetes, test your urine for ketones every 4 hours. If you have type 2 diabetes, test your urine for ketones as directed by your health care provider.  Eat some form of food that contains carbohydrates. The carbohydrates can be in solid or liquid form. You should eat 45-50 g of carbohydrates every 3-4 hours.  Replace fluids if you have a fever, vomit, or have diarrhea. Ask your health care provider for specific rehydration instructions.  Watch carefully for the signs of  ketoacidosis if you have type 1 diabetes. Call your health care provider if any of the following symptoms are present, especially in children:  Moderate to large ketones in the urine along with a high blood glucose level.  Severe nausea.  Vomiting.  Diarrhea.  Abdominal pain.  Rapid breathing.  Drink extra liquids that do not contain sugar such as water.  Be careful with over-the-counter medicines. Read the labels. They may contain sugar or types of sugars that can increase your blood glucose level. Food Choices for Illness All of the food choices below contain about 15 g of carbohydrates. Plan ahead and keep some of these foods around.    to  cup carbonated beverage containing sugar. Carbonated beverages will usually be better tolerated if they are opened and left at room temperature for a few minutes.   of a twin frozen ice pop.   cup regular gelatin.   cup juice.   cup ice cream or frozen yogurt.   cup cooked cereal.   cup sherbet.  1 cup clear broth or soup.  1 cup cream soup.   cup regular custard.   cup regular pudding.  1 cup sports drink.  1 cup plain yogurt.  1 slice toast.  6 squares saltine crackers.  5 vanilla wafers. SEEK MEDICAL CARE IF:   You are unable to drink fluids, even small amounts.  You have nausea and vomiting for more than 6 hours.  You have diarrhea for more than 6 hours.  Your  blood glucose level is more than 240 mg/dL, even with additional insulin.  There is a change in mental status.  You develop an additional serious sickness.  You have been sick for 2 days and are not getting better.  You have a fever. SEEK IMMEDIATE MEDICAL CARE IF:  You have difficulty breathing.  You have moderate to large ketone levels. MAKE SURE YOU:  Understand these instructions.  Will watch your condition.  Will get help right away if you are not doing well or get worse. Document Released: 01/09/2003 Document Revised:  05/23/2013 Document Reviewed: 06/15/2012 [facility] Patient Information 2015 [place], [place]. This information is not intended to replace advice given to you by your health care provider. Make sure you discuss any questions you have with your health care provider.

## 2013-08-19 NOTE — Progress Notes (Signed)
Patient ID: Billy Hale, male   DOB: 10/10/1950, 63 y.o.   MRN: [id number]  HPI: Billy Hale is a 63 y.o.-year-old male, returning for DM2, dx 2004, insulin-dependent, uncontrolled, with complications (peripheral neuropathy). Last visit 1.5 mo ago.  Last hemoglobin A1c was: Lab Results  Component Value Date   HGBA1C 8.5* 05/05/2013   HGBA1C 9.6* 01/06/2013   HGBA1C 11.4* 09/23/2012   He is on: - U500 insulin - he may forget to take it 30 min before a meal and may take it after eating!: 0.27 << 0.25 << 0.20 << 0.15 mL << 0.1 mL with brunch 0.27 << 0.25 << 0.20 << 0.18 mL << 0.15 mL with dinner - Metformin 1000 bid  - Invokana 300 mg daily >> he loves this as it controls his appetite (and he lost 4 lbs since last visit).   He was able to eat more fruits and vegetables and eats 2x a day now >> takes the U500 insulin 2x a day.  Pt checks his sugars 3-4 a day - forgot log - sugars are better - am: 220-270 >> 109-240 ?>> 42 x 1 (6 weeks ago), 74 x 1, 90-189 >> (78) 112-321 (431) >> 96-206 (284x1) - 2h after b'fast: 390s >> not checking >> n/c >> 112-153 (238 x1) - before lunch: 360s-480s >> 291-458 >> 275-458 >> 250-378 >> 228-279 >> 196 >> 140-187 (317 x1) - 2h after lunch: 219 >> 135-199 - late afternoon and dinnertime:  >> 267-300 >> 263-399 >> 179-290 >> 130-257 >> 200-210 >> 168-245 (354) >> 121-193  - bedtime: 240-318 >> n/c >> 280-390 (if forgets insulin before a meal) >> 196-284 >> 83-177 (236 x1) He had a 83 ; he has hypoglycemia awareness at 70. His highest sugar was 317.  Pt does not have chronic kidney disease, last BUN/creatinine was:  Lab Results  Component Value Date   BUN 16 05/05/2013   CREATININE 0.6 05/05/2013  He is on Valsartan. Last set of lipids: Lab Results  Component Value Date   CHOL 137 05/05/2013   HDL 39.60 05/05/2013   LDLCALC 68 05/05/2013   LDLDIRECT 72.9 11/01/2010   TRIG 147.0 05/05/2013   CHOLHDL 3 05/05/2013  He is on Simvastatin. Pt's last eye exam  was 08/2012. No DR reportedly; had cataracts. Has a new appt coming up. Denies numbness and tingling in his legs.   He also has a history of mild transaminitis, HTN- on Diovan HCT, Amlodipine, HL - on statin, obesity, depression, hypogonadism, OSA - compliant with CPAP.   At one of the last visits, I referred him to nutrition although, he did not feel motivated to change his diet.  I reviewed pt's medications, allergies, PMH, social hx, family hx and no changes required, except as mentioned above.  ROS: Constitutional: + weight loss, no fatigue, no excessive urination, + hot flushes Eyes: no blurry vision, no xerophthalmia ENT: no sore throat, no nodules palpated in throat, no dysphagia/odynophagia, no hoarseness Cardiovascular: no CP/SOB/palpitations/leg swelling Respiratory: no cough/no SOB Gastrointestinal: + N/no V/+ D/no C Musculoskeletal: no  Muscle aches/+ joint aches Skin: no rashes Neurological: no tremors/numbness/tingling/dizziness, + HA  PE: BP 122/64  Pulse 110  Temp(Src) 98.4 F (36.9 C) (Oral)  Resp 12  Wt 333 lb (151.048 kg)  SpO2 96% Wt Readings from Last 3 Encounters:  08/19/13 333 lb (151.048 kg)  07/08/13 337 lb (152.862 kg)  06/01/13 335 lb 12.8 oz (152.318 kg)   Constitutional: obese, in NAD, large neck, pale Eyes:  PERRLA, EOMI, no exophthalmos ENT: moist mucous membranes, no thyromegaly, no cervical lymphadenopathy, Mallampati 3.5 Cardiovascular: RRR, No MRG Respiratory: CTA B Gastrointestinal: abdomen soft, NT, ND, BS+ Musculoskeletal: no deformities, strength intact in all 4 Skin: moist, warm, no rashes  ASSESSMENT: 1. DM2, insulin-dependent, uncontrolled, with complications and increased insulin resistance; on U500 - peripheral neuropathy  PLAN:   1. Patient with poor diabetes control, on U500 insulin regimen, now with continuous improvement in diabetes control, especially after starting Invokana. - I advised him to: Patient Instructions   Please continue 0.27 mL U500 insulin before brunch and dinner. Continue Metformin 1000 mg 2x a day. Continue Invokana 300 mg daily in am. Please come back for a follow-up appointment in 3 months. Please stop at the lab. KEEP UP THE GREAT JOB! - He was sick (URI) 3 weks ago >> gave him sick day rules - up to date with eye exams - will check A1c and BMP since he is on  Invokana and ARB. - RTC in 3 mos with the sugar log  Office Visit on 08/19/2013  Component Date Value Ref Range Status  . Sodium 08/19/2013 133* 135 - 145 mEq/L Final  . Potassium 08/19/2013 3.9  3.5 - 5.1 mEq/L Final  . Chloride 08/19/2013 101  96 - 112 mEq/L Final  . CO2 08/19/2013 20  19 - 32 mEq/L Final  . Glucose, Bld 08/19/2013 222* 70 - 99 mg/dL Final  . BUN 11/91/4782 17  6 - 23 mg/dL Final  . Creatinine, Ser 08/19/2013 0.8  0.4 - 1.5 mg/dL Final  . Calcium 95/62/1308 9.6  8.4 - 10.5 mg/dL Final  . GFR 65/78/4696 105.01  >60.00 mL/min Final  . Hemoglobin A1C 08/19/2013 7.8* 4.6 - 6.5 % Final   Glycemic Control Guidelines for People with Diabetes:Non Diabetic:  <6%Goal of Therapy: <7%Additional Action Suggested:  >8%    Msg sent: Dear [name], HbA1c finally in the 7's! Glucose is a little high as you can see. Potassium and kidney function are fine. Sincerely, Billy Pavlov MD

## 2013-08-31 ENCOUNTER — Telehealth: Payer: Self-pay | Admitting: Pulmonary Disease

## 2013-08-31 NOTE — Telephone Encounter (Signed)
I spoke with the pt wife and she states they has still not heard anything from [facility] about new cpap. According to pt chart an order was faxed in may for a new machine but we were notified that the pt will need a new sleep study. This was done and another order was then faxed on 08-01-13 with copy of sleep study to [facility]. I called [facility] and was advised that they do not have this pt on record as ever being their pt. I then went to speak with Alida. She has placed a call to Fayrene FearingJames, our contact for [facility], and is is checking into this and will call Alida back. Pt spouse is aware we will call her once we find something out. Will await call back from ElaineJames. Carron CurieJennifer Shawny Borkowski, CMA

## 2013-09-01 NOTE — Telephone Encounter (Signed)
Spoke with Billy Hale @ [facility], this was faxed to them 08/01/13 and documented along with sleep study,ins card. Per Jame @s  [facility] says they never received, confirmed fax# [phone]. Asked to re-fax, along with setting from 06/01/13, done. Spoke with Annice PihJackie @ [facility] they have all documentation and working on fulfilling order. James @ [facility] has spoken to pt wife last night and informed working on order as they never received and she is ok per Billy Hale. Kandice Hams.Silvie Obremski

## 2013-10-19 ENCOUNTER — Other Ambulatory Visit: Payer: Self-pay | Admitting: *Deleted

## 2013-10-19 MED ORDER — CANAGLIFLOZIN 300 MG PO TABS: ORAL_TABLET | ORAL | Status: DC

## 2013-10-24 ENCOUNTER — Other Ambulatory Visit: Payer: Self-pay | Admitting: Internal Medicine

## 2013-11-09 ENCOUNTER — Other Ambulatory Visit: Payer: Self-pay | Admitting: Internal Medicine

## 2014-01-20 ENCOUNTER — Other Ambulatory Visit: Payer: Self-pay | Admitting: Internal Medicine

## 2014-01-27 ENCOUNTER — Other Ambulatory Visit: Payer: Self-pay | Admitting: *Deleted

## 2014-01-27 MED ORDER — SIMVASTATIN 20 MG PO TABS: 20.0000 mg | ORAL_TABLET | Freq: Every day | ORAL | Status: DC

## 2014-01-27 MED ORDER — CANAGLIFLOZIN 300 MG PO TABS: 300.0000 mg | ORAL_TABLET | Freq: Every morning | ORAL | Status: DC

## 2014-01-27 MED ORDER — VALSARTAN-HYDROCHLOROTHIAZIDE 160-25 MG PO TABS: 1.0000 | ORAL_TABLET | Freq: Every day | ORAL | Status: DC

## 2014-01-27 MED ORDER — AMLODIPINE BESYLATE 10 MG PO TABS: 10.0000 mg | ORAL_TABLET | Freq: Every day | ORAL | Status: DC

## 2014-02-03 ENCOUNTER — Other Ambulatory Visit: Payer: Self-pay | Admitting: Internal Medicine

## 2014-02-16 ENCOUNTER — Other Ambulatory Visit: Payer: Self-pay | Admitting: Internal Medicine

## 2014-02-23 LAB — HM DIABETES EYE EXAM

## 2014-03-29 ENCOUNTER — Encounter: Payer: Self-pay | Admitting: Internal Medicine

## 2014-03-31 ENCOUNTER — Telehealth: Payer: Self-pay | Admitting: Internal Medicine

## 2014-03-31 NOTE — Telephone Encounter (Signed)
Pt needs dm follow up and wife would like for her hus to have labs work in advance. What labs can I sch?

## 2014-04-04 NOTE — Telephone Encounter (Signed)
Pt was referred to dr Elvera Lennoxgherghe

## 2014-05-08 ENCOUNTER — Ambulatory Visit (INDEPENDENT_AMBULATORY_CARE_PROVIDER_SITE_OTHER): Payer: BLUE CROSS/BLUE SHIELD | Admitting: Internal Medicine

## 2014-05-08 ENCOUNTER — Encounter: Payer: Self-pay | Admitting: Internal Medicine

## 2014-05-08 VITALS — BP 142/70 | HR 118 | Temp 98.2°F | Ht 69.0 in | Wt 334.0 lb

## 2014-05-08 DIAGNOSIS — G8929 Other chronic pain: Secondary | ICD-10-CM | POA: Diagnosis not present

## 2014-05-08 DIAGNOSIS — I10 Essential (primary) hypertension: Secondary | ICD-10-CM | POA: Diagnosis not present

## 2014-05-08 DIAGNOSIS — E1149 Type 2 diabetes mellitus with other diabetic neurological complication: Secondary | ICD-10-CM | POA: Diagnosis not present

## 2014-05-08 DIAGNOSIS — M25511 Pain in right shoulder: Secondary | ICD-10-CM

## 2014-05-08 LAB — BASIC METABOLIC PANEL
BUN: 21 mg/dL (ref 6–23)
CO2: 24 mEq/L (ref 19–32)
CREATININE: 0.77 mg/dL (ref 0.40–1.50)
Calcium: 10.5 mg/dL (ref 8.4–10.5)
Chloride: 100 mEq/L (ref 96–112)
GFR: 107.92 mL/min (ref >60.00)
GLUCOSE: 237 mg/dL — AB (ref 70–99)
Potassium: 4.1 mEq/L (ref 3.5–5.1)
SODIUM: 133 meq/L — AB (ref 135–145)

## 2014-05-08 LAB — CBC WITH DIFFERENTIAL/PLATELET
BASOS PCT: 0.3 % (ref 0.0–3.0)
Basophils Absolute: 0 10*3/uL (ref 0.0–0.1)
EOS ABS: 0.4 10*3/uL (ref 0.0–0.7)
EOS PCT: 5 % (ref 0.0–5.0)
HCT: 43.8 % (ref 39.0–52.0)
HEMOGLOBIN: 15.3 g/dL (ref 13.0–17.0)
LYMPHS PCT: 26 % (ref 12.0–46.0)
Lymphs Abs: 2 10*3/uL (ref 0.7–4.0)
MCHC: 34.8 g/dL (ref 30.0–36.0)
MCV: 96.6 fl (ref 78.0–100.0)
Monocytes Absolute: 0.7 10*3/uL (ref 0.1–1.0)
Monocytes Relative: 8.7 % (ref 3.0–12.0)
NEUTROS ABS: 4.6 10*3/uL (ref 1.4–7.7)
NEUTROS PCT: 60 % (ref 43.0–77.0)
Platelets: 182 10*3/uL (ref 150.0–400.0)
RBC: 4.53 Mil/uL (ref 4.22–5.81)
RDW: 15.1 % (ref 11.5–15.5)
WBC: 7.6 10*3/uL (ref 4.0–10.5)

## 2014-05-08 LAB — MICROALBUMIN / CREATININE URINE RATIO
Creatinine,U: 46.9 mg/dL
MICROALB UR: 34.9 mg/dL — AB (ref 0.0–1.9)
Microalb Creat Ratio: 74.4 mg/g — ABNORMAL HIGH (ref 0.0–30.0)

## 2014-05-08 LAB — HEMOGLOBIN A1C: Hgb A1c MFr Bld: 8.6 % — ABNORMAL HIGH (ref 4.6–6.5)

## 2014-05-08 LAB — URIC ACID: Uric Acid, Serum: 5 mg/dL (ref 4.0–7.8)

## 2014-05-08 MED ORDER — DICLOFENAC SODIUM 1 % TD GEL: 2.0000 g | Freq: Four times a day (QID) | TRANSDERMAL | Status: DC

## 2014-05-08 NOTE — Assessment & Plan Note (Signed)
Arrange endocrine follow up.  Check a1c, BMET, and microalb /cr ratio. Lab Results  Component Value Date   HGBA1C 8.6* 05/08/2014   HGBA1C 7.8* 08/19/2013   HGBA1C 8.5* 05/05/2013   Lab Results  Component Value Date   MICROALBUR 34.9* 05/08/2014   LDLCALC 68 05/05/2013   CREATININE 0.77 05/08/2014
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
## 2014-05-08 NOTE — Assessment & Plan Note (Signed)
Stable. Monitor electrolytes and kidney function. 

## 2014-05-08 NOTE — Assessment & Plan Note (Signed)
64 year old white male with worsening chronic right shoulder pain. I doubt he has rotator cuff injury. He may have right shoulder bursitis. Refer to sports medicine for further evaluation and management. Patient understands steroid injection may exacerbate his blood sugars. Patient instructed to contact his endocrinologist for sliding scale instructions for short acting insulin.

## 2014-05-08 NOTE — Patient Instructions (Signed)
Our office will contact you re: referral to sports medicine (Dr. Katrinka BlazingSmith) Please call Dr. Charlean SanfilippoGherghe's office regarding instructions for sliding scale for elevated blood sugar readings We will help arrange follow up visit with Dr. Elvera LennoxGherghe

## 2014-05-08 NOTE — Progress Notes (Signed)
Pre visit review using our clinic review tool, if applicable. No additional management support is needed unless otherwise documented below in the visit note. 

## 2014-05-09 ENCOUNTER — Ambulatory Visit: Payer: Self-pay | Admitting: Family Medicine

## 2014-05-12 ENCOUNTER — Telehealth: Payer: Self-pay | Admitting: Internal Medicine

## 2014-05-12 ENCOUNTER — Ambulatory Visit (INDEPENDENT_AMBULATORY_CARE_PROVIDER_SITE_OTHER): Payer: BLUE CROSS/BLUE SHIELD | Admitting: Internal Medicine

## 2014-05-12 ENCOUNTER — Encounter: Payer: Self-pay | Admitting: Internal Medicine

## 2014-05-12 VITALS — BP 112/62 | HR 110 | Temp 98.7°F | Resp 12 | Wt 332.0 lb

## 2014-05-12 DIAGNOSIS — E1149 Type 2 diabetes mellitus with other diabetic neurological complication: Secondary | ICD-10-CM | POA: Diagnosis not present

## 2014-05-12 MED ORDER — INSULIN PEN NEEDLE 32G X 4 MM MISC: Status: DC

## 2014-05-12 MED ORDER — INSULIN REGULAR HUMAN (CONC) 500 UNIT/ML ~~LOC~~ SOLN
120.0000 [IU] | Freq: Two times a day (BID) | SUBCUTANEOUS | Status: DC
Start: 2014-05-12 — End: 2015-07-12

## 2014-05-12 NOTE — Patient Instructions (Signed)
Patient Instructions  Please change the U500 insulin as follows (vial - pen):  0.24 mL (vial) or 120 units (pen) before a small meal  0.28 mL (vial) or 140 units (pen) before a large meal  If you get the steroid injection, please increase the doses as follows:  0.26 mL (vial) or 130 units (pen) before a small meal  0.30 mL (vial) or 150 units (pen) before a large meal  Please let me know if the sugars are still high, in that case you need even higher doses.  Continue Metformin 1000 mg 2x a day. Continue Invokana 300 mg daily in am.

## 2014-05-12 NOTE — Telephone Encounter (Signed)
Pharmacy calling to let us know that the humulin r 500 kwik pen is on back order with no release date please advise  [phone]

## 2014-05-12 NOTE — Progress Notes (Signed)
Patient ID: Billy Hale, male   DOB: [birth year], 64 y.o.   MRN: [id number]  HPI: [name] is a 64 y.o.-year-old male, returning for DM2, dx 2004, insulin-dependent, uncontrolled, with complications (peripheral neuropathy). Last visit 9 mo ago!  Last hemoglobin A1c was higher as he had to come off Invokana due to insurance coverage. Since then, he started it back: Lab Results  Component Value Date   HGBA1C 8.6* 05/08/2014   HGBA1C 7.8* 08/19/2013   HGBA1C 8.5* 05/05/2013   He is on: - U500 insulin: 0.27 mL with brunch 0.27 mL with dinner - Metformin 1000 bid  - Invokana 300 mg daily   Pt checks his sugars 3-4 a day - forgot log - sugars are better after restarted the Invokana (out for 90 days) 2 weeks ago. Sugars on Invokana: - am: (78) 112-321 (431) >> 96-206 (284x1) >> 156-199, 265 - 2h after b'fast: 390s >> not checking >> n/c >> 112-153 (238 x1) >> n/c - before lunch: 275-458 >> 250-378 >> 228-279 >> 196 >> 140-187 (317 x1) >> 150-223 - 2h after lunch: 219 >> 135-199 >> n/c - late afternoon and dinnertime:130-257 >> 200-210 >> 168-245 (354) >> 121-193 >> 159-229 - bedtime: 280-390 (if forgets insulin before a meal) >> 196-284 >> 83-177 (236 x1) >> 88, 157, 198 He had a 88 ; he has hypoglycemia awareness at 70. His highest sugar was 400s.  Meals: - b'fast: toast + jelly or biscuit and if not working, eggs + meat + grits - lunch: sandwich + chips + cookie or + fruit - dinner: big meal: meat + starch + veggie - post-dinner snack: cookie or icecream - snack: PB cheese crackers  Pt does not have chronic kidney disease, last BUN/creatinine was:  Lab Results  Component Value Date   BUN 21 05/08/2014   CREATININE 0.77 05/08/2014  He is on Valsartan. Last set of lipids: Lab Results  Component Value Date   CHOL 137 05/05/2013   HDL 39.60 05/05/2013   LDLCALC 68 05/05/2013   LDLDIRECT 72.9 11/01/2010   TRIG 147.0 05/05/2013   CHOLHDL 3 05/05/2013  He is on  Simvastatin. Pt's last eye exam was 02/23/2014. No DR reportedly; had cataracts.  Denies numbness and tingling in his legs.   He also has a history of mild transaminitis, HTN- on Diovan HCT, Amlodipine, HL - on statin, obesity, depression, hypogonadism, OSA - compliant with CPAP.   At one of the last visits, I referred him to nutrition although, he did not feel motivated to change his diet.  I reviewed pt's medications, allergies, PMH, social hx, family hx, and changes were documented in the history of present illness. Otherwise, unchanged from my initial visit note.  ROS: Constitutional: + weight loss, no fatigue, no excessive urination, + hot flushes Eyes: no blurry vision, no xerophthalmia ENT: no sore throat, no nodules palpated in throat, no dysphagia/odynophagia, no hoarseness Cardiovascular: no CP/+ SOB/no palpitations/leg swelling Respiratory: no cough/+ SOB Gastrointestinal: + N/no V/+ D/no C Musculoskeletal: +  Muscle aches/+ joint aches Skin: no rashes Neurological: no tremors/numbness/tingling/dizziness, no  HA  PE: BP 112/62 mmHg  Pulse 110  Temp(Src) 98.7 F (37.1 C) (Oral)  Resp 12  Wt 332 lb (150.594 kg)  SpO2 95% Body mass index is 49.01 kg/(m^2). Wt Readings from Last 3 Encounters:  05/12/14 332 lb (150.594 kg)  05/08/14 334 lb (151.501 kg)  08/19/13 333 lb (151.048 kg)   Constitutional: obese, in NAD, large neck, pale Eyes: PERRLA, EOMI, no  exophthalmos ENT: moist mucous membranes, no thyromegaly, no cervical lymphadenopathy, Mallampati 3.5 Cardiovascular: Tachycardia, No MRG, bilateral leg edema+  Respiratory: CTA B Gastrointestinal: abdomen soft, NT, ND, BS+ Musculoskeletal: no deformities, strength intact in all 4 Skin: moist, warm, no rashes  ASSESSMENT: 1. DM2, insulin-dependent, uncontrolled, with complications and increased insulin resistance; on U500 - peripheral neuropathy  PLAN:   1. Patient with poor diabetes control (worsened after  stopping Invokana, now improving again after re-addition of Invokana to his U500 insulin regimen). Sugars are improving, but still fluctuating. We are not quite at the full effect of Invokana after restarting but I expect that this will be achieved in the next week. Due to variability, I will advise the patient to use 2 different doses of U500, depending on the size of his meals. Adding sliding scale U500 insulin in the future is another option, but we opted for a simpler regimen in the past because he was previously overwhelmed by his diabetes control. Now that he is more comfortable with a regimen, we can consider this in the near future. He plans to have a steroid injection for bursitis soon. I gave him a slightly higher insulin dose regimen for the 3 days following the injection. I advised him to call me if the sugars stay high, he might need even a higher insulin doses. - I advised him to: Patient Instructions  Please change the U500 insulin as follows (vial - pen):  0.24 mL (vial) or 120 units (pen) before a small meal  0.28 mL (vial) or 140 units (pen) before a large meal  If you get the steroid injection, please increase the doses as follows:  0.26 mL (vial) or 130 units (pen) before a small meal  0.30 mL (vial) or 150 units (pen) before a large meal  Please let me know if the sugars are still high, in that case you need even higher doses.  Continue Metformin 1000 mg 2x a day. Continue Invokana 300 mg daily in am.  Please come back for a follow-up appointment in 3 months. - I sent the prescription for U500 pens to his pharmacy - for pens, we don't need to convert between doses. I thoroughly advised patient and his wife about how to inject the U500 insulin with a pen. They feel comfortable about this. I hope this would be covered by his insurance. - I made suggestions about his diet, which is still poor - up to date with eye exams - RTC in 1.5 mos with the sugar log  - time spent  with the patient: 40 min, of which >50% was spent in reviewing his sugar log, discussing his hyper-glycemic episodes, counseling him about diet, and insulin pen use. Please see the discussed topics above.

## 2014-05-12 NOTE — Telephone Encounter (Signed)
Called pharmacist back, advised her per Dr Elvera LennoxGherghe for pt to continue ont he U-500 vial (at the same dose) until the pens become available. If pt needs refill let us know. Pt has one 90 day refill in system.

## 2014-05-22 ENCOUNTER — Other Ambulatory Visit (INDEPENDENT_AMBULATORY_CARE_PROVIDER_SITE_OTHER): Payer: BLUE CROSS/BLUE SHIELD

## 2014-05-22 ENCOUNTER — Encounter: Payer: Self-pay | Admitting: Family Medicine

## 2014-05-22 ENCOUNTER — Ambulatory Visit (INDEPENDENT_AMBULATORY_CARE_PROVIDER_SITE_OTHER): Payer: BLUE CROSS/BLUE SHIELD | Admitting: Family Medicine

## 2014-05-22 VITALS — BP 116/70 | HR 108 | Ht 69.0 in | Wt 334.0 lb

## 2014-05-22 DIAGNOSIS — M25511 Pain in right shoulder: Secondary | ICD-10-CM

## 2014-05-22 DIAGNOSIS — G8929 Other chronic pain: Secondary | ICD-10-CM | POA: Diagnosis not present
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
## 2014-05-22 NOTE — Patient Instructions (Signed)
Good to see you.  Ice 20 minutes 2 times daily. Usually after activity and before bed. Exercises 3 times a week.  Try pennsaid twice daily Vitamin D 2000 IU daily Fish oil 2 grams daily See me again in 3 weeks and we will see what is going on.

## 2014-05-22 NOTE — Progress Notes (Signed)
Pre visit review using our clinic review tool, if applicable. No additional management support is needed unless otherwise documented below in the visit note. 

## 2014-05-22 NOTE — Assessment & Plan Note (Signed)
Patient overall is doing much better than what was anticipated. Patient has more of a bursitis of the shoulder as well as degenerative changes of the shoulder. Patient will try conservative therapy including icing, exercise, as well as topical anti-inflammatories and over-the-counter natural supplementations. Patient and will come back and see me again in 3 weeks. Continuing to have difficulty we can consider an intra-articular injection.

## 2014-05-23 ENCOUNTER — Other Ambulatory Visit: Payer: Self-pay

## 2014-05-29 ENCOUNTER — Encounter: Payer: Self-pay | Admitting: Family Medicine

## 2014-05-29 ENCOUNTER — Encounter: Payer: Self-pay | Admitting: Internal Medicine

## 2014-06-02 ENCOUNTER — Encounter: Payer: Self-pay | Admitting: Internal Medicine

## 2014-06-02 ENCOUNTER — Ambulatory Visit: Payer: BC Managed Care – PPO | Admitting: Pulmonary Disease

## 2014-06-12 ENCOUNTER — Encounter: Payer: Self-pay | Admitting: Family Medicine

## 2014-06-12 ENCOUNTER — Ambulatory Visit (INDEPENDENT_AMBULATORY_CARE_PROVIDER_SITE_OTHER): Payer: BLUE CROSS/BLUE SHIELD | Admitting: Family Medicine

## 2014-06-12 VITALS — BP 136/82 | HR 97 | Ht 69.0 in | Wt 331.0 lb

## 2014-06-12 DIAGNOSIS — G8929 Other chronic pain: Secondary | ICD-10-CM | POA: Diagnosis not present

## 2014-06-12 DIAGNOSIS — M25511 Pain in right shoulder: Secondary | ICD-10-CM

## 2014-06-12 NOTE — Assessment & Plan Note (Signed)
Patient is doing significantly better. We discussed continuing the home exercises in the icing protocol. We discussed what activities to potentially still avoid. Patient will start progressing and patient given some exercises protocol. Patient and will come back and see me again in 3-4 weeks if the pain is not completely resolved.

## 2014-06-12 NOTE — Patient Instructions (Signed)
Good to see you Ice is your friend When hurting consider ibuprofen 600mg  3 times a day for 3 days  Continue exercises 2-3 times a week.  See me when you need me.

## 2014-06-12 NOTE — Progress Notes (Signed)
Pre visit review using our clinic review tool, if applicable. No additional management support is needed unless otherwise documented below in the visit note. 
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
## 2014-06-13 IMAGING — US US ABDOMEN COMPLETE
1 series · 13 of 25 positions shown · non-contrast
Comparison: None.

CLINICAL DATA: Elevated liver function tests, on medication for
hypertension and high cholesterol.

COMPLETE ABDOMINAL ULTRASOUND

[Series 1: us abdomen complete · 0.30mm/px · 13 of 71 slices shown]
[im 1/71]
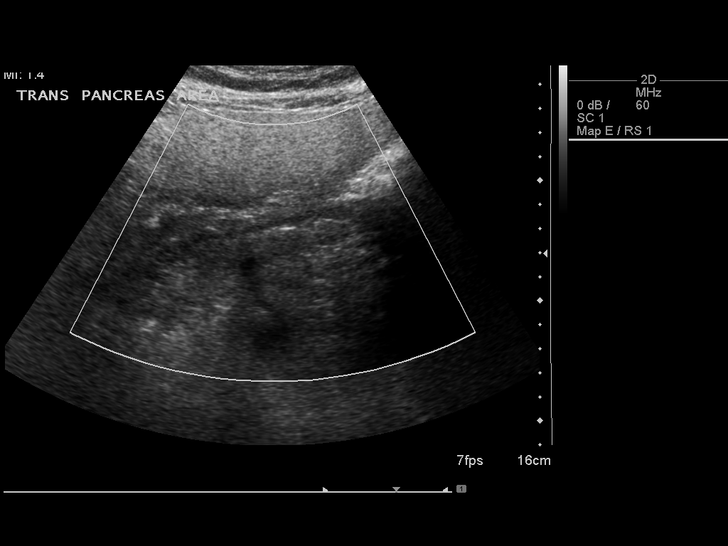
[im 6/71]
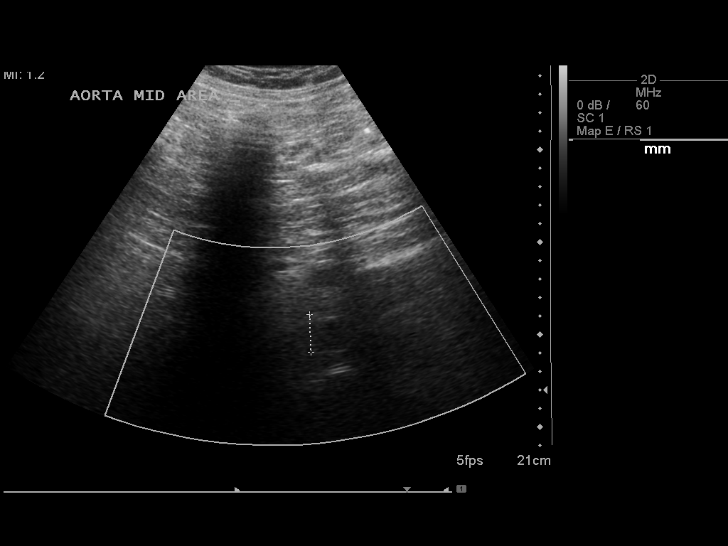
[im 12/71]
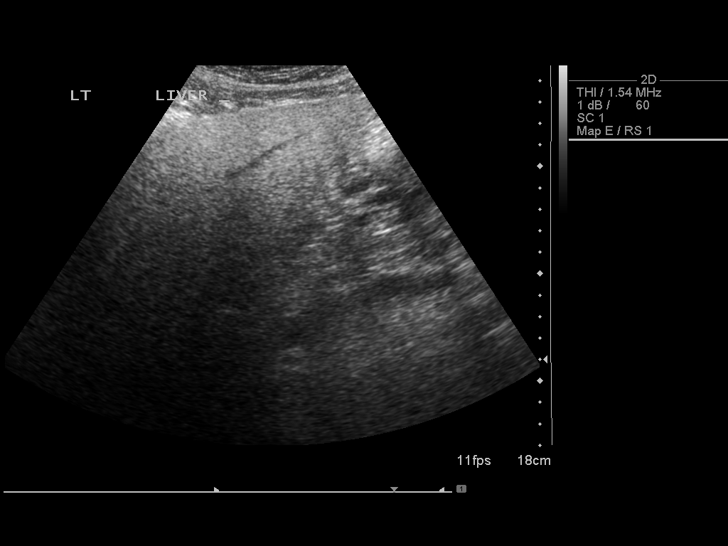
[im 18/71]
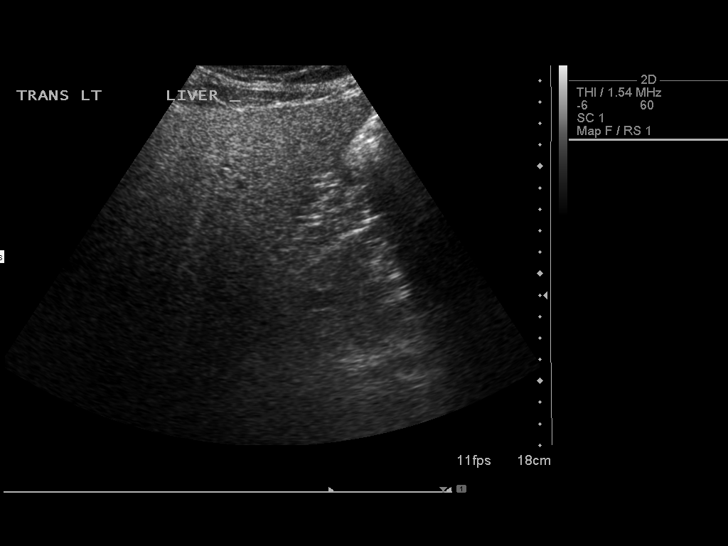
[im 24/71]
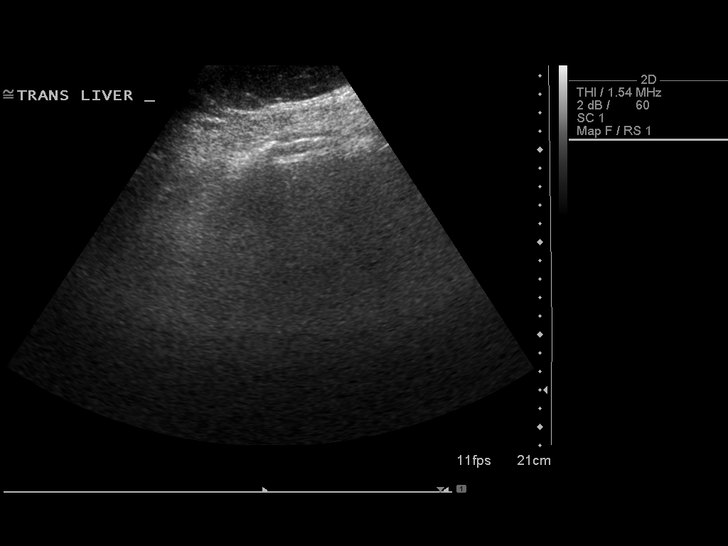
[im 30/71]
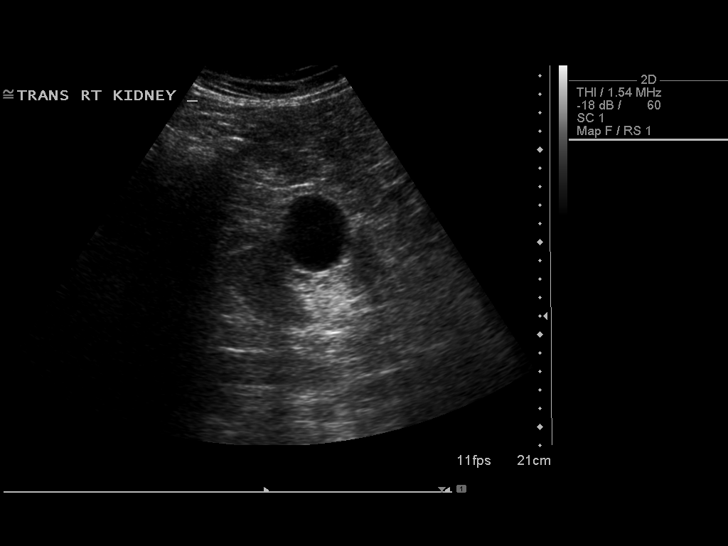
[im 36/71]
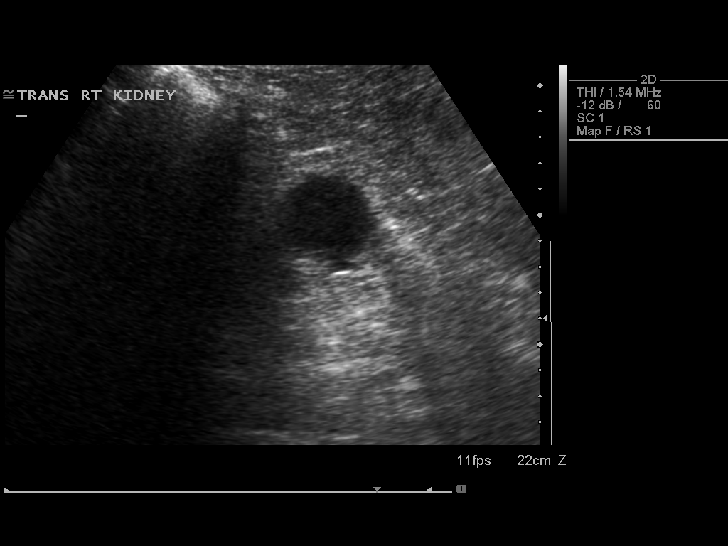
[im 41/71]
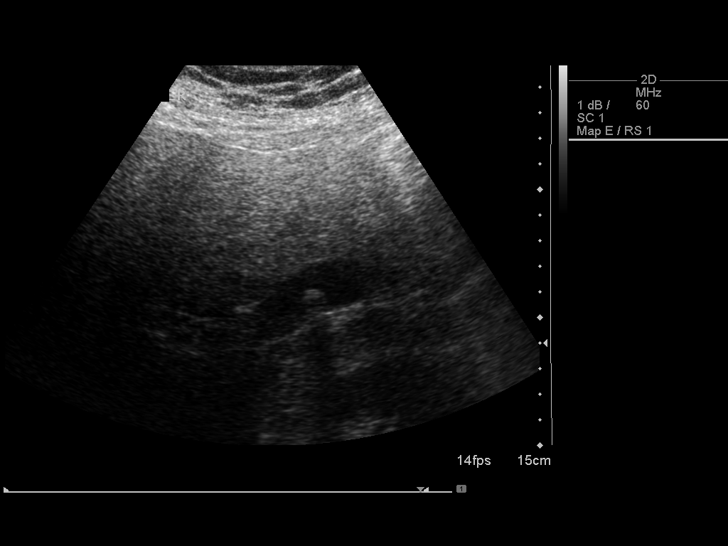
[im 47/71]
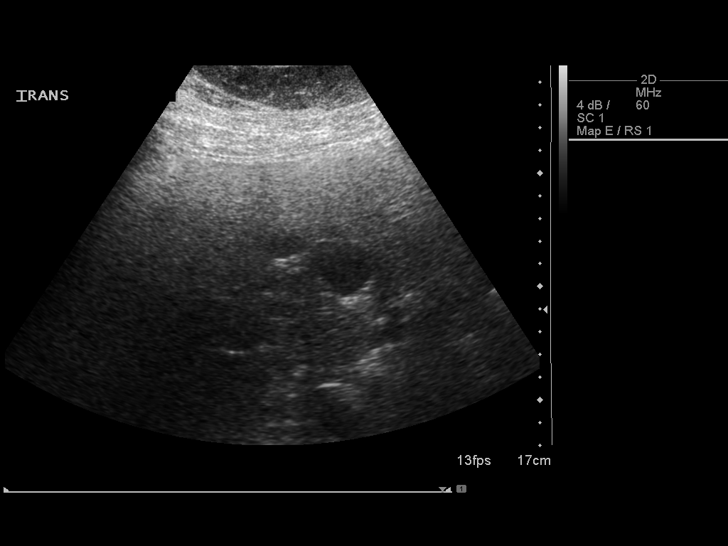
[im 53/71]
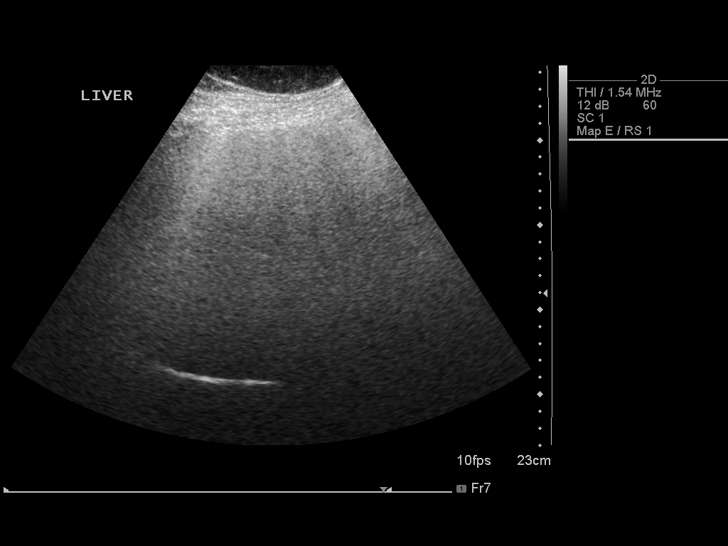
[im 59/71]
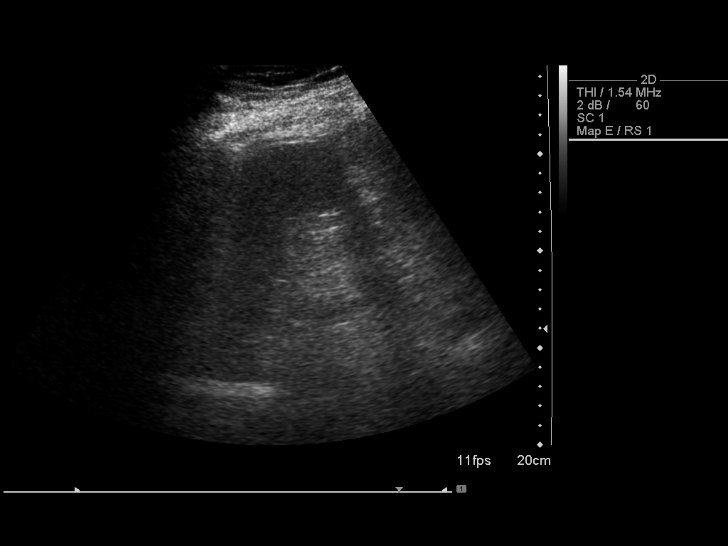
[im 65/71]
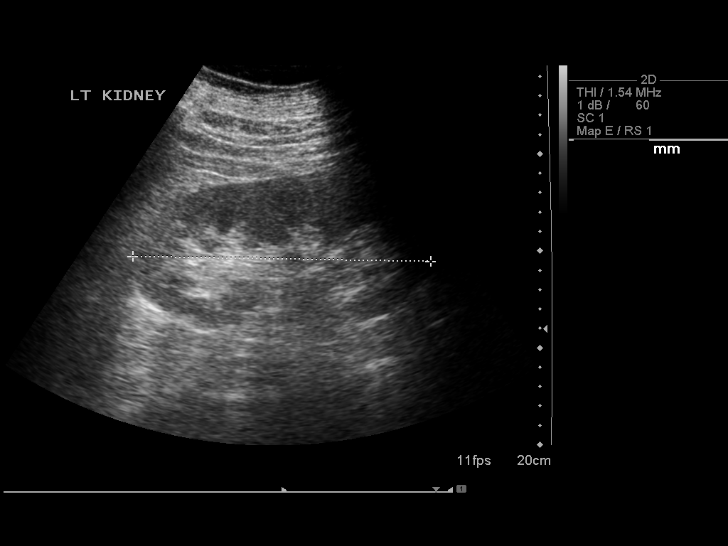
[im 71/71]
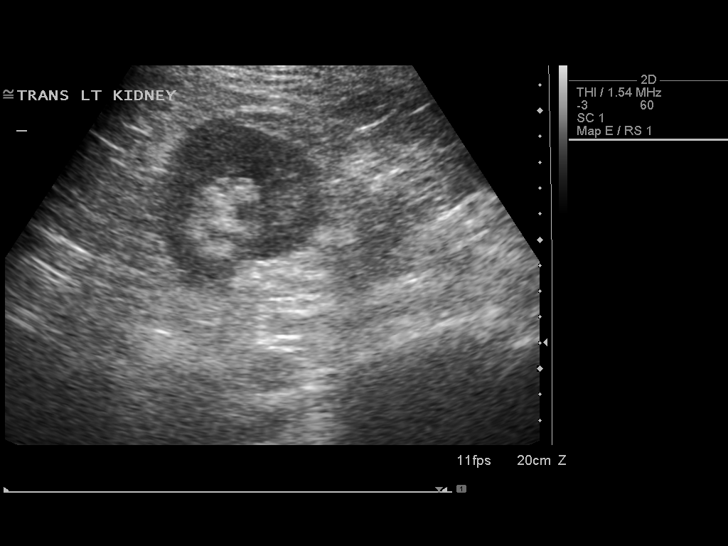

[13 of 25 positions shown; findings below may reference images not displayed]

FINDINGS: Gallbladder:  The gallbladder is suboptimally visualized but there
does appear to be a single 9 mm gallstone present with acoustical
shadowing.  There is no pain over the gallbladder with compression.

Common bile duct:  The common bile duct is normal measuring 2.8 mm
in diameter.

Liver:  It the liver is diffusely echogenic consistent with fatty
infiltration.  Also the liver is enlarged measuring 18.1 cm
sagittally.

IVC:  The IVC is obscured by bowel gas and body habitus.

Pancreas:  The pancreas also is completely obscured by bowel gas
and body habitus.

Spleen:  The spleen is normal measuring 7.4 cm sagittally.

Right Kidney:  No hydronephrosis is seen.  The right kidney
measures 15.2 cm sagittally.  A cyst is present in the midportion
of 4.5 cm in diameter.

Left Kidney:  The left kidney is also prominent measuring 15.4 cm.

Abdominal aorta:  The abdominal aorta is partially obscured by
bowel gas and body habitus.

Much of the anatomy is not well visualized due to large body
habitus and bowel gas
IMPRESSION: 1.  Single 9 mm gallstone.  No pain over the gallbladder with
compression currently.
2.  Diffuse fatty infiltration of the liver with hepatomegaly.
3.  Much of the remainder of the anatomy is obscured by large body
habitus and bowel gas.
4.  Prominent kidneys.  No hydronephrosis.

## 2014-06-23 ENCOUNTER — Encounter: Payer: Self-pay | Admitting: Internal Medicine

## 2014-06-23 ENCOUNTER — Ambulatory Visit (INDEPENDENT_AMBULATORY_CARE_PROVIDER_SITE_OTHER): Payer: BLUE CROSS/BLUE SHIELD | Admitting: Internal Medicine

## 2014-06-23 VITALS — BP 114/72 | HR 109 | Temp 98.6°F | Resp 12 | Wt 332.8 lb

## 2014-06-23 DIAGNOSIS — E1149 Type 2 diabetes mellitus with other diabetic neurological complication: Secondary | ICD-10-CM

## 2014-06-23 MED ORDER — CANAGLIFLOZIN 300 MG PO TABS: 300.0000 mg | ORAL_TABLET | Freq: Every morning | ORAL | Status: DC

## 2014-06-23 MED ORDER — METFORMIN HCL 1000 MG PO TABS: 1000.0000 mg | ORAL_TABLET | Freq: Two times a day (BID) | ORAL | Status: DC

## 2014-06-23 NOTE — Patient Instructions (Signed)
Please continue: Please change the U500 insulin as follows (vial - pen):  0.24 mL (vial) before a small meal  0.28 mL (vial) before a large meal  Continue Metformin 1000 mg 2x a day. Continue Invokana 300 mg daily in am.  Please come back for a follow-up appointment in 3 months.

## 2014-06-23 NOTE — Progress Notes (Signed)
Patient ID: Billy Hale, male   DOB: 1950/03/31, 64 y.o.   MRN: [id number]  HPI: Billy Hale is a 64 y.o.-year-old male, returning for DM2, dx 2004, insulin-dependent, uncontrolled, with complications (peripheral neuropathy). Last visit 1 mo ago.  Last hemoglobin A1c:  Lab Results  Component Value Date   HGBA1C 8.6* 05/08/2014   HGBA1C 7.8* 08/19/2013   HGBA1C 8.5* 05/05/2013   He is on: - U500 insulin:  0.24 mL (vial) or 120 units (pen) before a small meal  0.28 mL (vial) or 140 units (pen) before a large meal - Metformin 1000 bid  - Invokana 300 mg daily   Pt checks his sugars 3-4 a day - forgot log again - sugars better: - am: [phone] 112-321 (431) >> 96-206 (284x1) >> 156-199, 265 >> 80 x3, 100-120 - 2h after b'fast: 390s >> not checking >> n/c >> 112-153 (238 x1) >> n/c - before lunch: 275-458 >> 250-378 >> 228-279 >> 196 >> 140-187 (317 x1) >> 150-223 >> 125-158 - 2h after lunch: 219 >> 135-199 >> n/c - late afternoon and dinnertime:130-257 >> 200-210 >> 168-245 (354) >> 121-193 >> 159-229 >> 160s, 250x2 - bedtime: 280-390 (if forgets insulin before a meal) >> 196-284 >> 83-177 (236 x1) >> 88, 157, 198 >> n/c - nighttime: 225, 250 He had 3 sugars in the 80s in am ; he has hypoglycemia awareness at 70. His highest sugar was 400s.  Meals: - b'fast: toast + jelly or biscuit and if not working, eggs + meat + grits - lunch: sandwich + chips + cookie or + fruit - dinner: big meal: meat + starch + veggie - post-dinner snack: cookie or icecream - snack: PB cheese crackers  Pt does not have chronic kidney disease, last BUN/creatinine was:  Lab Results  Component Value Date   BUN 21 05/08/2014   CREATININE 0.77 05/08/2014  He is on Valsartan. Last set of lipids: Lab Results  Component Value Date   CHOL 137 05/05/2013   HDL 39.60 05/05/2013   LDLCALC 68 05/05/2013   LDLDIRECT 72.9 11/01/2010   TRIG 147.0 05/05/2013   CHOLHDL 3 05/05/2013  He is on Simvastatin. Pt's last  eye exam was 02/23/2014. No DR reportedly; had cataracts.  Denies numbness and tingling in his legs.   He also has a history of mild transaminitis, HTN- on Diovan HCT, Amlodipine, HL - on statin, obesity, depression, hypogonadism, OSA - compliant with CPAP.   At one of the last visits, I referred him to nutrition although, he did not feel motivated to change his diet.  I reviewed pt's medications, allergies, PMH, social hx, family hx, and changes were documented in the history of present illness. Otherwise, unchanged from my initial visit note.  ROS: Constitutional: no weight gain, no fatigue, no excessive urination, + hot flushes Eyes: no blurry vision, no xerophthalmia ENT: no sore throat, no nodules palpated in throat, no dysphagia/odynophagia, no hoarseness Cardiovascular: no CP/SOB/palpitations/leg swelling Respiratory: no cough/SOB Gastrointestinal: + N/no V/+ D/no C Musculoskeletal: +  Muscle aches/+ joint aches Skin: no rashes Neurological: no tremors/numbness/tingling/dizziness, + HA  PE: BP 114/72 mmHg  Pulse 109  Temp(Src) 98.6 F (37 C) (Oral)  Resp 12  Wt 332 lb 12.8 oz (150.957 kg)  SpO2 95% Body mass index is 49.12 kg/(m^2). Wt Readings from Last 3 Encounters:  06/23/14 332 lb 12.8 oz (150.957 kg)  06/12/14 331 lb (150.141 kg)  05/22/14 334 lb (151.501 kg)   Constitutional: obese, in NAD, large neck, pale  Eyes: PERRLA, EOMI, no exophthalmos ENT: moist mucous membranes, no thyromegaly, no cervical lymphadenopathy, Mallampati 3.5 Cardiovascular: Tachycardia, No MRG, +bilateral leg edema  Respiratory: CTA B Gastrointestinal: abdomen soft, NT, ND, BS+ Musculoskeletal: no deformities, strength intact in all 4 Skin: moist, warm, no rashes  ASSESSMENT: 1. DM2, insulin-dependent, uncontrolled, with complications and increased insulin resistance; on U500 - peripheral neuropathy  PLAN:   1. Patient with poor diabetes control (worsened after stopping Invokana, now  improved again after re-addition of Invokana to his U500 insulin regimen). Sugars are much improved now! He has high sugars at night, but we cannot increase dinnertime U500 dose as he may drop sugars even more in am.  He did not get the U500 pens from the pharmacy >> will continue vials for now and keep the pens on his med list. - I advised him to: Patient Instructions  Please continue: Please change the U500 insulin as follows (vial - pen):  0.24 mL (vial) before a small meal  0.28 mL (vial) before a large meal  Continue Metformin 1000 mg 2x a day. Continue Invokana 300 mg daily in am.  Please come back for a follow-up appointment in 3 months.  - up to date with eye exams - RTC in 3 mos with the sugar log

## 2014-08-13 ENCOUNTER — Other Ambulatory Visit: Payer: Self-pay | Admitting: Internal Medicine

## 2014-09-24 ENCOUNTER — Other Ambulatory Visit: Payer: Self-pay | Admitting: Internal Medicine

## 2014-09-26 ENCOUNTER — Ambulatory Visit (INDEPENDENT_AMBULATORY_CARE_PROVIDER_SITE_OTHER): Payer: BLUE CROSS/BLUE SHIELD | Admitting: Internal Medicine

## 2014-09-26 ENCOUNTER — Other Ambulatory Visit (INDEPENDENT_AMBULATORY_CARE_PROVIDER_SITE_OTHER): Payer: BLUE CROSS/BLUE SHIELD | Admitting: *Deleted

## 2014-09-26 ENCOUNTER — Encounter: Payer: Self-pay | Admitting: Internal Medicine

## 2014-09-26 VITALS — BP 138/64 | HR 96 | Temp 98.2°F | Resp 12 | Wt 332.0 lb

## 2014-09-26 DIAGNOSIS — E1149 Type 2 diabetes mellitus with other diabetic neurological complication: Secondary | ICD-10-CM | POA: Diagnosis not present

## 2014-09-26 LAB — POCT GLYCOSYLATED HEMOGLOBIN (HGB A1C): Hemoglobin A1C: 7.5

## 2014-09-26 MED ORDER — "INSULIN SYRINGE-NEEDLE U-100 30G X 5/16"" 1 ML MISC": 1.0000 | Freq: Three times a day (TID) | Status: DC

## 2014-09-26 MED ORDER — INSULIN REGULAR HUMAN (CONC) 500 UNIT/ML ~~LOC~~ SOLN: SUBCUTANEOUS | Status: DC

## 2014-09-26 NOTE — Patient Instructions (Signed)
Please try not to nap in the afternoon or take shorter naps.  If you are able to do this, please continue current regimen: - Metformin 1000 mg 2x a day - Invokana 300 mg daily  - U500 insulin:   0.24 mL, 24 units on the syringe before a small meal  0.28 mL, 28 units on the syringe  before a large meal  If you continue to wake up at night and eat, please add 0.12 mL (12 units on the syringe) of insulin before the snack.  Please schedule another appt with Dr Artist Pais for an annual exam.

## 2014-09-26 NOTE — Progress Notes (Addendum)
Patient ID: Billy Hale, male   DOB: March 31, 1950, 64 y.o.   MRN: [id number]  HPI: Billy Hale is a 64 y.o.-year-old male, returning for DM2, dx 2004, insulin-dependent, uncontrolled, with complications (peripheral neuropathy). Last visit 3 mo ago.  Last hemoglobin A1c:  Lab Results  Component Value Date   HGBA1C 8.6* 05/08/2014   HGBA1C 7.8* 08/19/2013   HGBA1C 8.5* 05/05/2013   He is on: - U500 insulin:  0.24 mL (vial) or 120 units (pen) before a small meal  0.28 mL (vial) or 140 units (pen) before a large meal - Metformin 1000 mg 2x a day  - Invokana 300 mg daily   Pt checks his sugars 3-4 a day - brings log - sugars higher (eating more erratically): - am: (78) 112-321 (431) >> 96-206 (284x1) >> 156-199, 265 >> 80 x3, 100-120 >> 135, 165-181, 239 - 2h after b'fast: 390s >> not checking >> n/c >> 112-153 (238 x1) >> n/c - before lunch: 250-378 >> 228-279 >> 196 >> 140-187 (317 x1) >> 150-223 >> 125-158 >> 158-175, 214, 239 - 2h after lunch: 219 >> 135-199 >> n/c - dinner:130-257 >> 200-210 >> 168-245 (354) >> 121-193 >> 159-229 >> 160s, 250x2 >> 118-192, 239 - bedtime: 280-390 >> 196-284 >> 83-177 (236 x1) >> 88, 157, 198 >> n/c - nighttime: 225, 250 >> 178-221 He had 3 sugars in the 80s in am ; he has hypoglycemia awareness at 70. His highest sugar was 400s.  Meals: - b'fast: toast + jelly or biscuit and if not working, eggs + meat + grits - lunch: sandwich + chips + cookie or + fruit - dinner: big meal: meat + starch + veggie >> but usually skips it as he takes a 5h nap after he comes home from work >> wakes up at night and eats large portions of sugary cereals + milk (no insulin with this) - post-dinner snack: cookie or icecream - snack: PB cheese crackers  Pt does not have chronic kidney disease, last BUN/creatinine was:  Lab Results  Component Value Date   BUN 21 05/08/2014   CREATININE 0.77 05/08/2014  He is on Valsartan. Last set of lipids: Lab Results  Component  Value Date   CHOL 137 05/05/2013   HDL 39.60 05/05/2013   LDLCALC 68 05/05/2013   LDLDIRECT 72.9 11/01/2010   TRIG 147.0 05/05/2013   CHOLHDL 3 05/05/2013  He is on Simvastatin. Pt's last eye exam was 02/23/2014. No DR reportedly; had cataracts.  Denies numbness and tingling in his legs.   He also has a history of mild transaminitis, HTN- on Diovan HCT, Amlodipine, HL - on statin, obesity, depression, hypogonadism, OSA - compliant with CPAP.   At one of the last visits, I referred him to nutrition although, he did not feel motivated to change his diet.  I reviewed pt's medications, allergies, PMH, social hx, family hx, and changes were documented in the history of present illness. Otherwise, unchanged from my initial visit note.  ROS: Constitutional: no weight gain, no fatigue, no excessive urination, + hot flushes Eyes: no blurry vision, no xerophthalmia ENT: no sore throat, no nodules palpated in throat, no dysphagia/odynophagia, no hoarseness Cardiovascular: no CP/SOB/palpitations/leg swelling Respiratory: + cough/+ SOB/+ wheezing Gastrointestinal: + N/+ V/+ D/no C/+ heartburn Musculoskeletal: +  Muscle aches/+ joint aches Skin: no rashes Neurological: no tremors/numbness/tingling/dizziness, + HA  PE: BP 138/64 mmHg  Pulse 96  Temp(Src) 98.2 F (36.8 C) (Oral)  Resp 12  Wt 332 lb (150.594 kg)  SpO2 95% Body mass index is 49.01 kg/(m^2). Wt Readings from Last 3 Encounters:  09/26/14 332 lb (150.594 kg)  06/23/14 332 lb 12.8 oz (150.957 kg)  06/12/14 331 lb (150.141 kg)   Constitutional: obese, in NAD, large neck, pale Eyes: PERRLA, EOMI, no exophthalmos ENT: moist mucous membranes, no thyromegaly, no cervical lymphadenopathy, Mallampati 3.5 Cardiovascular: Tachycardia, No MRG, +bilateral leg edema  Respiratory: CTA B Gastrointestinal: abdomen soft, NT, ND, BS+ Musculoskeletal: no deformities, strength intact in all 4 Skin: moist, warm, no rashes Foot exam performed  today >> please see separate sheet.  ASSESSMENT: 1. DM2, insulin-dependent, uncontrolled, with complications and increased insulin resistance; on U500 - peripheral neuropathy  PLAN:   1. Patient with poor diabetes control (worsened after relaxing his diet). Sugars are trending high again >> advised him to skip the long pm nap (5-6h!) so he does not wake up at night to eat. However, if he does, advised him to bolus with this.  He did not get the U500 pens from the pharmacy >> will continue vials for now and keep the pens on his med list. - I advised him to: Patient Instructions  Please try not to nap in the afternoon or take shorter naps.  If you are able to do this, please continue current regimen: - Metformin 1000 mg 2x a day - Invokana 300 mg daily  - U500 insulin:   0.24 mL, 24 units on the syringe before a small meal  0.28 mL, 28 units on the syringe  before a large meal  If you continue to wake up at night and eat, please add 0.12 mL (12 units on the syringe) of insulin before the snack.  Please schedule another appt with Dr Artist Pais for an annual exam.  - up to date with eye exams - will need to check Lipids at next visit if not having appt with PCP until then. - refilled insulin and syringes - check HbA1c today >> 7.5% - RTC in 3 mos with the sugar log

## 2014-10-24 ENCOUNTER — Other Ambulatory Visit: Payer: Self-pay | Admitting: Family Medicine

## 2014-11-01 ENCOUNTER — Other Ambulatory Visit: Payer: Self-pay | Admitting: Internal Medicine

## 2014-11-17 ENCOUNTER — Telehealth: Payer: Self-pay | Admitting: Internal Medicine

## 2014-11-17 NOTE — Telephone Encounter (Signed)
Pt would like a physical this year and Dr Artist PaisYoo is out and unsure of his return. Would you mind doing his physical? Pt day off is thurs and fri

## 2014-11-19 ENCOUNTER — Emergency Department (HOSPITAL_COMMUNITY)
Admission: EM | Admit: 2014-11-19 | Discharge: 2014-11-19 | Disposition: A | Discharge status: Home / Self Care | Payer: BLUE CROSS/BLUE SHIELD | Attending: Emergency Medicine | Admitting: Emergency Medicine

## 2014-11-19 ENCOUNTER — Emergency Department (HOSPITAL_COMMUNITY): Payer: BLUE CROSS/BLUE SHIELD

## 2014-11-19 ENCOUNTER — Encounter (HOSPITAL_COMMUNITY): Payer: Self-pay | Admitting: *Deleted

## 2014-11-19 DIAGNOSIS — E119 Type 2 diabetes mellitus without complications: Secondary | ICD-10-CM | POA: Diagnosis not present

## 2014-11-19 DIAGNOSIS — Y998 Other external cause status: Secondary | ICD-10-CM | POA: Diagnosis not present

## 2014-11-19 DIAGNOSIS — Z79899 Other long term (current) drug therapy: Secondary | ICD-10-CM | POA: Insufficient documentation

## 2014-11-19 DIAGNOSIS — I10 Essential (primary) hypertension: Secondary | ICD-10-CM | POA: Insufficient documentation

## 2014-11-19 DIAGNOSIS — W010XXA Fall on same level from slipping, tripping and stumbling without subsequent striking against object, initial encounter: Secondary | ICD-10-CM | POA: Insufficient documentation

## 2014-11-19 DIAGNOSIS — S92912B Unspecified fracture of left toe(s), initial encounter for open fracture: Secondary | ICD-10-CM

## 2014-11-19 DIAGNOSIS — F329 Major depressive disorder, single episode, unspecified: Secondary | ICD-10-CM | POA: Diagnosis not present

## 2014-11-19 DIAGNOSIS — Y9389 Activity, other specified: Secondary | ICD-10-CM | POA: Insufficient documentation

## 2014-11-19 DIAGNOSIS — E669 Obesity, unspecified: Secondary | ICD-10-CM | POA: Insufficient documentation

## 2014-11-19 DIAGNOSIS — Z791 Long term (current) use of non-steroidal anti-inflammatories (NSAID): Secondary | ICD-10-CM | POA: Diagnosis not present

## 2014-11-19 DIAGNOSIS — Z8719 Personal history of other diseases of the digestive system: Secondary | ICD-10-CM | POA: Insufficient documentation

## 2014-11-19 DIAGNOSIS — Z23 Encounter for immunization: Secondary | ICD-10-CM | POA: Diagnosis not present

## 2014-11-19 DIAGNOSIS — Z7982 Long term (current) use of aspirin: Secondary | ICD-10-CM | POA: Insufficient documentation

## 2014-11-19 DIAGNOSIS — Y9289 Other specified places as the place of occurrence of the external cause: Secondary | ICD-10-CM | POA: Diagnosis not present

## 2014-11-19 DIAGNOSIS — S99922A Unspecified injury of left foot, initial encounter: Secondary | ICD-10-CM | POA: Diagnosis present

## 2014-11-19 DIAGNOSIS — S92352B Displaced fracture of fifth metatarsal bone, left foot, initial encounter for open fracture: Secondary | ICD-10-CM | POA: Diagnosis not present

## 2014-11-19 DIAGNOSIS — Z794 Long term (current) use of insulin: Secondary | ICD-10-CM | POA: Insufficient documentation

## 2014-11-19 DIAGNOSIS — E785 Hyperlipidemia, unspecified: Secondary | ICD-10-CM | POA: Insufficient documentation

## 2014-11-19 MED ORDER — TETANUS-DIPHTH-ACELL PERTUSSIS 5-2.5-18.5 LF-MCG/0.5 IM SUSP
0.5000 mL | Freq: Once | INTRAMUSCULAR | Status: AC
  Administered 2014-11-19: 0.5 mL via INTRAMUSCULAR
  Filled 2014-11-19: qty 0.5

## 2014-11-19 MED ORDER — OXYCODONE-ACETAMINOPHEN 5-325 MG PO TABS
1.0000 | ORAL_TABLET | Freq: Once | ORAL | Status: AC
  Administered 2014-11-19: 1 via ORAL
  Filled 2014-11-19: qty 1

## 2014-11-19 MED ORDER — LIDOCAINE-EPINEPHRINE (PF) 2 %-1:200000 IJ SOLN: 10.0000 mL | Freq: Once | INTRAMUSCULAR | Status: DC

## 2014-11-19 MED ORDER — OXYCODONE-ACETAMINOPHEN 5-325 MG PO TABS: 1.0000 | ORAL_TABLET | ORAL | Status: DC | PRN

## 2014-11-19 MED ORDER — AMOXICILLIN-POT CLAVULANATE 875-125 MG PO TABS: 1.0000 | ORAL_TABLET | Freq: Two times a day (BID) | ORAL | Status: DC

## 2014-11-19 MED ORDER — LIDOCAINE HCL 2 % IJ SOLN
10.0000 mL | Freq: Once | INTRAMUSCULAR | Status: AC
  Administered 2014-11-19: 200 mg
  Filled 2014-11-19: qty 20

## 2014-11-19 NOTE — Discharge Instructions (Signed)
Keep your foot elevated when at home. Keep your laceration clean and dry. Take Augmentin as prescribed until all gone to prevent infection. Take ibuprofen for pain, Percocet for severe pain only. Please follow-up with orthopedic specialist is referred this week. Call them tomorrow for an appointment.  Toe Fracture A toe fracture is a break in one of the toe bones (phalanges). CAUSES This condition may be caused by:  Dropping a heavy object on your toe.  Stubbing your toe.  Overusing your toe or doing repetitive exercise.  Twisting or stretching your toe out of place. RISK FACTORS This condition is more likely to develop in people who:  Play contact sports.  Have a bone disease.  Have a low calcium level. SYMPTOMS The main symptoms of this condition are swelling and pain in the toe. The pain may get worse with standing or walking. Other symptoms include:  Bruising.  Stiffness.  Numbness.  A change in the way the toe looks.  Broken bones that poke through the skin.  Blood beneath the toenail. DIAGNOSIS This condition is diagnosed with a physical exam. You may also have X-rays. TREATMENT  Treatment for this condition depends on the type of fracture and its severity. Treatment may involve:  Taping the broken toe to a toe that is next to it (buddy taping). This is the most common treatment for fractures in which the bone has not moved out of place (nondisplaced fracture).  Wearing a shoe that has a wide, rigid sole to protect the toe and to limit its movement.  Wearing a walking cast.  Having a procedure to move the toe back into place.  Surgery. This may be needed:  If there are many pieces of broken bone that are out of place (displaced).  If the toe joint breaks.  If the bone breaks through the skin.  Physical therapy. This is done to help regain movement and strength in the toe. You may need follow-up X-rays to make sure that the bone is healing well and  staying in position. HOME CARE INSTRUCTIONS If You Have a Cast:  Do not stick anything inside the cast to scratch your skin. Doing that increases your risk of infection.  Check the skin around the cast every day. Report any concerns to your health care provider. You may put lotion on dry skin around the edges of the cast. Do not apply lotion to the skin underneath the cast.  Do not put pressure on any part of the cast until it is fully hardened. This may take several hours.  Keep the cast clean and dry. Bathing  Do not take baths, swim, or use a hot tub until your health care provider approves. Ask your health care provider if you can take showers. You may only be allowed to take sponge baths for bathing.  If your health care provider approves bathing and showering, cover the cast or bandage (dressing) with a watertight plastic bag to protect it from water. Do not let the cast or dressing get wet. Managing Pain, Stiffness, and Swelling  If you do not have a cast, apply ice to the injured area, if directed.  Put ice in a plastic bag.  Place a towel between your skin and the bag.  Leave the ice on for 20 minutes, 2-3 times per day.  Move your toes often to avoid stiffness and to lessen swelling.  Raise (elevate) the injured area above the level of your heart while you are sitting or lying  down. Driving  Do not drive or operate heavy machinery while taking pain medicine.  Do not drive while wearing a cast on a foot that you use for driving. Activity  Return to your normal activities as directed by your health care provider. Ask your health care provider what activities are safe for you.  Perform exercises daily as directed by your health care provider or physical therapist. Safety  Do not use the injured limb to support your body weight until your health care provider says that you can. Use crutches or other assistive devices as directed by your health care provider. General  Instructions  If your toe was treated with buddy taping, follow your health care provider's instructions for changing the gauze and tape. Change it more often:  The gauze and tape get wet. If this happens, dry the space between the toes.  The gauze and tape are too tight and cause your toe to become pale or numb.  Wear a protective shoe as directed by your health care provider. If you were not given a protective shoe, wear sturdy, supportive shoes. Your shoes should not pinch your toes and should not fit tightly against your toes.  Do not use any tobacco products, including cigarettes, chewing tobacco, or e-cigarettes. Tobacco can delay bone healing. If you need help quitting, ask your health care provider.  Take medicines only as directed by your health care provider.  Keep all follow-up visits as directed by your health care provider. This is important. SEEK MEDICAL CARE IF:  You have a fever.  Your pain medicine is not helping.  Your toe is cold.  Your toe is numb.  You still have pain after one week of rest and treatment.  You still have pain after your health care provider has said that you can start walking again.  You have pain, tingling, or numbness in your foot that is not going away. SEEK IMMEDIATE MEDICAL CARE IF:  You have severe pain.  You have redness or inflammation in your toe that is getting worse.  You have pain or numbness in your toe that is getting worse.  Your toe turns blue.   This information is not intended to replace advice given to you by your health care provider. Make sure you discuss any questions you have with your health care provider.   Document Released: 01/04/2000 Document Revised: 09/27/2014 Document Reviewed: 11/02/2013 Elsevier Interactive Patient Education [employer] Inc[employer]

## 2014-11-19 NOTE — Telephone Encounter (Signed)
That is fine 
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
## 2014-11-19 NOTE — ED Notes (Signed)
Pt fell backwards after trying to swat the cat that tried to bite him and he pulled his left pinkie toe to the left and he has a split to between his last two toes. Bleeding controlled

## 2014-11-19 NOTE — ED Notes (Signed)
Pt verbalized understanding of d/c instructions and has no further questions. Pt stable and NAD.  

## 2014-11-21 NOTE — Telephone Encounter (Signed)
[facility]

## 2014-12-06 ENCOUNTER — Other Ambulatory Visit: Payer: Self-pay | Admitting: Internal Medicine

## 2014-12-27 ENCOUNTER — Ambulatory Visit: Payer: BLUE CROSS/BLUE SHIELD | Admitting: Internal Medicine

## 2015-01-04 ENCOUNTER — Other Ambulatory Visit (INDEPENDENT_AMBULATORY_CARE_PROVIDER_SITE_OTHER): Payer: BLUE CROSS/BLUE SHIELD | Admitting: *Deleted

## 2015-01-04 ENCOUNTER — Encounter: Payer: Self-pay | Admitting: Internal Medicine

## 2015-01-04 ENCOUNTER — Other Ambulatory Visit: Payer: Self-pay | Admitting: *Deleted

## 2015-01-04 ENCOUNTER — Ambulatory Visit (INDEPENDENT_AMBULATORY_CARE_PROVIDER_SITE_OTHER): Payer: BLUE CROSS/BLUE SHIELD | Admitting: Internal Medicine

## 2015-01-04 VITALS — BP 128/72 | HR 116 | Temp 97.9°F | Resp 12 | Wt 331.6 lb

## 2015-01-04 DIAGNOSIS — E1140 Type 2 diabetes mellitus with diabetic neuropathy, unspecified: Secondary | ICD-10-CM | POA: Diagnosis not present

## 2015-01-04 DIAGNOSIS — Z794 Long term (current) use of insulin: Secondary | ICD-10-CM | POA: Diagnosis not present

## 2015-01-04 DIAGNOSIS — E1141 Type 2 diabetes mellitus with diabetic mononeuropathy: Secondary | ICD-10-CM | POA: Diagnosis not present

## 2015-01-04 LAB — POCT GLYCOSYLATED HEMOGLOBIN (HGB A1C): HEMOGLOBIN A1C: 7.4

## 2015-01-04 MED ORDER — INSULIN REGULAR HUMAN (CONC) 500 UNIT/ML ~~LOC~~ SOLN: SUBCUTANEOUS | Status: DC

## 2015-01-04 MED ORDER — METFORMIN HCL 1000 MG PO TABS: 2000.0000 mg | ORAL_TABLET | Freq: Every day | ORAL | Status: DC

## 2015-01-04 MED ORDER — CANAGLIFLOZIN 300 MG PO TABS: 300.0000 mg | ORAL_TABLET | Freq: Every morning | ORAL | Status: DC

## 2015-01-04 MED ORDER — "TUBERCULIN-ALLERGY SYRINGES 28G X 1/2"" 0.5 ML MISC": Status: DC

## 2015-01-04 NOTE — Patient Instructions (Addendum)
Please move Metformin 2000 mg with dinner. Continue: - Invokana 300 mg daily  - U500 insulin:   0.24 mL, 24 units on the syringe before a small meal  0.28 mL, 28 units on the syringe  before a large meal If you cnack at night and eat, please add 0.12 mL (12 units on the syringe) of insulin before the snack.  Please return in 3 months with your sugar log.   Keep up the good work!

## 2015-01-04 NOTE — Progress Notes (Signed)
Patient ID: Billy Hale, male   DOB: 06-18-1950, 64 y.o.   MRN: [id number]  HPI: Billy Hale is a 64 y.o.-year-old male, returning for DM2, dx 2004, insulin-dependent, uncontrolled, with complications (peripheral neuropathy). Last visit 3 mo ago.  He is snacking less now.  Last hemoglobin A1c:  Lab Results  Component Value Date   HGBA1C 7.5 09/26/2014   HGBA1C 8.6* 05/08/2014   HGBA1C 7.8* 08/19/2013   He is on: - Metformin 1000 mg 2x a day - Invokana 300 mg daily  - U500 insulin:   0.24 mL, 24 units on the syringe before a small meal  0.28 mL, 28 units on the syringe  before a large meal If you continue to wake up at night and eat, please add 0.12 mL (12 units on the syringe) of insulin before the snack.  Pt checks his sugars 3-4 a day - brings log: - am: 96-206 (284x1) >> 156-199, 265 >> 80 x3, 100-120 >> 135, 165-181, 239 >> 149-194, 203 - 2h after b'fast: 390s >> not checking >> n/c >> 112-153 (238 x1) >> n/c >> 182 - before lunch: 228-279 >> 196 >> 140-187 (317 x1) >> 150-223 >> 125-158 >> 158-175, 214, 239 >> 134-175, 189 - 2h after lunch: 219 >> 135-199 >> n/c - dinner:200-210 >> 168-245 (354) >> 121-193 >> 159-229 >> 160s, 250x2 >> 118-192, 239 >> 103, 118-162, 183 - bedtime: 280-390 >> 196-284 >> 83-177 (236 x1) >> 88, 157, 198 >> n/c - nighttime: 225, 250 >> 178-221 >> 117, 142  he has hypoglycemia awareness at 70. His highest sugar was 400s >> 203.  Meals: - b'fast: toast + jelly or biscuit and if not working, eggs + meat + grits - lunch: sandwich + chips + cookie or + fruit - dinner: big meal: meat + starch + veggie  - post-dinner snack: cookie or icecream - 12 units of U500 with this - snack: PB cheese crackers  Pt does not have chronic kidney disease, last BUN/creatinine was:  Lab Results  Component Value Date   BUN 21 05/08/2014   CREATININE 0.77 05/08/2014  He is on Valsartan. Last set of lipids: Lab Results  Component Value Date   CHOL 137 05/05/2013    HDL 39.60 05/05/2013   LDLCALC 68 05/05/2013   LDLDIRECT 72.9 11/01/2010   TRIG 147.0 05/05/2013   CHOLHDL 3 05/05/2013  He is on Simvastatin. Pt's last eye exam was 02/23/2014. No DR reportedly; had cataracts.  Denies numbness and tingling in his legs. Foot exam performed 09/2014.  He also has a history of mild transaminitis, HTN- on Diovan HCT, Amlodipine, HL - on statin, obesity, depression, hypogonadism, OSA - compliant with CPAP.   At one of the last visits, I referred him to nutrition although, he did not feel motivated to change his diet.  I reviewed pt's medications, allergies, PMH, social hx, family hx, and changes were documented in the history of present illness. Otherwise, unchanged from my initial visit note.  ROS: Constitutional: no weight gain/loss, no fatigue, no subjective hyperthermia/hypothermia Eyes: no blurry vision, no xerophthalmia ENT: no sore throat, no nodules palpated in throat, no dysphagia/odynophagia, no hoarseness Cardiovascular: no CP/SOB/palpitations/leg swelling Respiratory: no cough/SOB Gastrointestinal: no N/V/+ D/no C Musculoskeletal: no muscle/joint aches Skin: no rashes Neurological: no tremors/numbness/tingling/dizziness   PE: BP 128/72 mmHg  Pulse 116  Temp(Src) 97.9 F (36.6 C) (Oral)  Resp 12  Wt 331 lb 9.6 oz (150.413 kg)  SpO2 96% Body mass index is 48.95 kg/(m^2).  Wt Readings from Last 3 Encounters:  01/04/15 331 lb 9.6 oz (150.413 kg)  09/26/14 332 lb (150.594 kg)  06/23/14 332 lb 12.8 oz (150.957 kg)   Constitutional: obese, in NAD, large neck, pale Eyes: PERRLA, EOMI, no exophthalmos ENT: moist mucous membranes, no thyromegaly, no cervical lymphadenopathy, Mallampati 3.5 Cardiovascular: Tachycardia, No MRG, +bilateral leg edema  Respiratory: CTA B Gastrointestinal: abdomen soft, NT, ND, BS+ Musculoskeletal: no deformities, strength intact in all 4 Skin: moist, warm, no rashes  ASSESSMENT: 1. DM2, insulin-dependent,  uncontrolled, with complications and increased insulin resistance; on U500 - peripheral neuropathy  PLAN:   1. Patient with poor diabetes control, but better now in the second half of the day as he cut down snacking. Sugars in am still high >> will increase Metformin with dinner but not increase U500 as sugars at bedtime are at goal.  He did not get the U500 pens from the pharmacy >> will continue vials for now and keep the pens on his med list. - I advised him to: Patient Instructions  Please move Metformin 2000 mg with dinner. Continue: - Invokana 300 mg daily  - U500 insulin:   0.24 mL, 24 units on the syringe before a small meal  0.28 mL, 28 units on the syringe  before a large meal If you cnack at night and eat, please add 0.12 mL (12 units on the syringe) of insulin before the snack.  Please return in 3 months with your sugar log.   Keep up the good work!  - up to date with eye exams - will need to check Lipids at the next visit with PCP in january - check HbA1c today >> 7.4% - RTC in 3 mos with the sugar log

## 2015-02-21 ENCOUNTER — Other Ambulatory Visit: Payer: Self-pay | Admitting: Family Medicine

## 2015-03-02 ENCOUNTER — Telehealth: Payer: Self-pay | Admitting: Internal Medicine

## 2015-03-02 NOTE — Telephone Encounter (Signed)
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 Your first available is not until April, and pt was hoping to get in  before that time. Pt is off work on [employer] and Fridays. Is it ok to work in one of those days when you are not so booked?  Dr Olegario Messier schedule is uncertain at this time. Thank you.

## 2015-03-04 NOTE — Telephone Encounter (Signed)
That is fine 

## 2015-03-19 ENCOUNTER — Other Ambulatory Visit: Payer: Self-pay | Admitting: Internal Medicine

## 2015-03-28 NOTE — Telephone Encounter (Signed)
Pt scheduled  

## 2015-04-05 ENCOUNTER — Ambulatory Visit (INDEPENDENT_AMBULATORY_CARE_PROVIDER_SITE_OTHER): Payer: BLUE CROSS/BLUE SHIELD | Admitting: Internal Medicine

## 2015-04-05 ENCOUNTER — Encounter: Payer: Self-pay | Admitting: Internal Medicine

## 2015-04-05 ENCOUNTER — Other Ambulatory Visit: Payer: Self-pay | Admitting: *Deleted

## 2015-04-05 ENCOUNTER — Other Ambulatory Visit (INDEPENDENT_AMBULATORY_CARE_PROVIDER_SITE_OTHER): Payer: BLUE CROSS/BLUE SHIELD | Admitting: *Deleted

## 2015-04-05 VITALS — BP 124/70 | HR 103 | Temp 98.4°F | Resp 14 | Wt 336.0 lb

## 2015-04-05 DIAGNOSIS — E1141 Type 2 diabetes mellitus with diabetic mononeuropathy: Secondary | ICD-10-CM

## 2015-04-05 DIAGNOSIS — Z794 Long term (current) use of insulin: Secondary | ICD-10-CM

## 2015-04-05 LAB — POCT GLYCOSYLATED HEMOGLOBIN (HGB A1C): Hemoglobin A1C: 7.5

## 2015-04-05 MED ORDER — METFORMIN HCL 1000 MG PO TABS: 1000.0000 mg | ORAL_TABLET | Freq: Two times a day (BID) | ORAL | Status: DC

## 2015-04-05 NOTE — Progress Notes (Signed)
Patient ID: Billy Hale, male   DOB: [birth year], 65 y.o.   MRN: [id number]  HPI: Billy Hale is a 65 y.o.-year-old male, returning for DM2, dx 2004, insulin-dependent, uncontrolled, with complications (peripheral neuropathy). Last visit 3 mo ago.  Last hemoglobin A1c:  Lab Results  Component Value Date   HGBA1C 7.4 01/04/2015   HGBA1C 7.5 09/26/2014   HGBA1C 8.6* 05/08/2014   He is on: - Metformin 2000 mg with dinner. - Invokana 300 mg daily  - U500 insulin:   0.24 mL, 24 units on the syringe before a small meal  0.28 mL, 28 units on the syringe  before a large meal If you snack at night and eat, please add 0.12 mL (12 units on the syringe) of insulin before the snack.  Pt checks his sugars 3-4 a day - brings log: - am: 96-206 (284x1) >> 156-199, 265 >> 80 x3, 100-120 >> 135, 165-181, 239 >> 149-194, 203 >>  136-204, 286 - 2h after b'fast: 390s >> not checking >> n/c >> 112-153 (238 x1) >> n/c >> 182 >> n/c - before lunch: 140-187 (317 x1) >> 150-223 >> 125-158 >> 158-175, 214, 239 >> 134-175, 189 >> 134-171 - 2h after lunch: 219 >> 135-199 >> n/c - dinner:168-245 (354) >> 121-193 >> 159-229 >> 160s, 250x2 >> 118-192, 239 >> 103, 118-162, 183 >> 118-234 - bedtime: 280-390 >> 196-284 >> 83-177 (236 x1) >> 88, 157, 198 >> n/c  - nighttime: 225, 250 >> 178-221 >> 117, 142  he has hypoglycemia awareness at 70. His highest sugar was 400s >> 203 >> 200s.  Meals: - b'fast: toast + jelly or biscuit and if not working, eggs + meat + grits - lunch: sandwich + chips + cookie or + fruit - dinner: big meal: meat + starch + veggie  - post-dinner snack: cookie or icecream - 12 units of U500 with this - snack: PB cheese crackers  Pt does not have chronic kidney disease, last BUN/creatinine was:  Lab Results  Component Value Date   BUN 21 05/08/2014   CREATININE 0.77 05/08/2014  He is on Valsartan. Last set of lipids: Lab Results  Component Value Date   CHOL 137 05/05/2013   HDL 39.60  05/05/2013   LDLCALC 68 05/05/2013   LDLDIRECT 72.9 11/01/2010   TRIG 147.0 05/05/2013   CHOLHDL 3 05/05/2013  He is on Simvastatin. Pt's last eye exam was 02/23/2014. No DR reportedly; had cataracts.  Denies numbness and tingling in his legs. Foot exam performed 09/2014.  He also has a history of mild transaminitis, HTN- on Diovan HCT, Amlodipine, HL - on statin, obesity, depression, hypogonadism, OSA - compliant with CPAP.   At one of the last visits, I referred him to nutrition although, he did not feel motivated to change his diet.  I reviewed pt's medications, allergies, PMH, social hx, family hx, and changes were documented in the history of present illness. Otherwise, unchanged from my initial visit note.  ROS: Constitutional: no weight gain/loss, no fatigue, no subjective hyperthermia/hypothermia Eyes: no blurry vision, no xerophthalmia ENT: no sore throat, no nodules palpated in throat, no dysphagia/odynophagia, no hoarseness Cardiovascular: no CP/SOB/palpitations/leg swelling Respiratory: no cough/SOB Gastrointestinal: no N/V/+ D - with metformin/no C Musculoskeletal: no muscle/joint aches Skin: no rashes Neurological: no tremors/numbness/tingling/dizziness   PE: BP 124/70 mmHg  Pulse 103  Temp(Src) 98.4 F (36.9 C) (Oral)  Resp 14  Wt 336 lb (152.409 kg)  SpO2 95% Body mass index is 49.6 kg/(m^2). Wt Readings  from Last 3 Encounters:  04/05/15 336 lb (152.409 kg)  01/04/15 331 lb 9.6 oz (150.413 kg)  09/26/14 332 lb (150.594 kg)   Constitutional: obese, in NAD, large neck, pale Eyes: PERRLA, EOMI, no exophthalmos ENT: moist mucous membranes, no thyromegaly, no cervical lymphadenopathy, Mallampati 3.5 Cardiovascular: Tachycardia, No MRG, +bilateral leg edema  Respiratory: CTA B Gastrointestinal: abdomen soft, NT, ND, BS+ Musculoskeletal: no deformities, strength intact in all 4 Skin: moist, warm, no rashes  ASSESSMENT: 1. DM2, insulin-dependent,  uncontrolled, with complications and increased insulin resistance; on U500 - peripheral neuropathy  He did not get the U500 pens from the pharmacy >> will continue vials for now and keep the pens on his med list.  PLAN:   1. Patient with poor diabetes control, but sugars better at last visit after he cut down snacking. Sugars in am were still high >> moved all Metformin with dinner but did not increase U500 as sugars at bedtime were at goal.  - His sugars are higher in am and he does not feel well after moving all the Metformin at night >> will move it back to 2x a day - he has more spikes (unexplained) >> advised to write comments on his log when sugars spike but will not change the U500 insulin doses for now - I advised him to: Patient Instructions  Please continue:  - Metformin 2000 mg with dinner. - Invokana 300 mg daily in am - U500 insulin:   0.24 mL, 24 units on the syringe before a small meal  0.28 mL, 28 units on the syringe  before a large meal If you snack at night and eat, please add 0.12 mL (12 units on the syringe) of insulin before the snack.  Please return in 3 months with your sugar log.   - needs a new eye exam - will need to check Lipids at the next visit with PCP - coming up - check HbA1c today >> 7.5% (slightly higher) - RTC in 3 mos with the sugar log

## 2015-04-05 NOTE — Patient Instructions (Signed)
Please split Metformin back to 1000 mg 2x a day  Continue: - Invokana 300 mg daily in am - U500 insulin:   0.24 mL, 24 units on the syringe before a small meal  0.28 mL, 28 units on the syringe  before a large meal If you snack at night and eat, please add 0.12 mL (12 units on the syringe) of insulin before the snack.  Please return in 3 months with your sugar log.

## 2015-04-20 ENCOUNTER — Other Ambulatory Visit (INDEPENDENT_AMBULATORY_CARE_PROVIDER_SITE_OTHER): Payer: BLUE CROSS/BLUE SHIELD

## 2015-04-20 DIAGNOSIS — Z0000 Encounter for general adult medical examination without abnormal findings: Secondary | ICD-10-CM

## 2015-04-20 LAB — BASIC METABOLIC PANEL
BUN: 18 mg/dL (ref 6–23)
CALCIUM: 9.3 mg/dL (ref 8.4–10.5)
CO2: 28 meq/L (ref 19–32)
CREATININE: 0.7 mg/dL (ref 0.40–1.50)
Chloride: 102 mEq/L (ref 96–112)
GFR: 120.11 mL/min (ref >60.00)
GLUCOSE: 207 mg/dL — AB (ref 70–99)
Potassium: 4.1 mEq/L (ref 3.5–5.1)
SODIUM: 138 meq/L (ref 135–145)

## 2015-04-20 LAB — HEPATIC FUNCTION PANEL
ALBUMIN: 3.8 g/dL (ref 3.5–5.2)
ALK PHOS: 128 U/L — AB (ref 39–117)
ALT: 58 U/L — ABNORMAL HIGH (ref 0–53)
AST: 41 U/L — AB (ref 0–37)
Bilirubin, Direct: 0.1 mg/dL (ref 0.0–0.3)
Total Bilirubin: 0.6 mg/dL (ref 0.2–1.2)
Total Protein: 7.2 g/dL (ref 6.0–8.3)

## 2015-04-20 LAB — LIPID PANEL
CHOL/HDL RATIO: 3
CHOLESTEROL: 123 mg/dL (ref 0–200)
HDL: 36.9 mg/dL — ABNORMAL LOW (ref >39.00)
LDL Cholesterol: 60 mg/dL (ref 0–99)
NonHDL: 86.59
TRIGLYCERIDES: 135 mg/dL (ref 0.0–149.0)
VLDL: 27 mg/dL (ref 0.0–40.0)

## 2015-04-20 LAB — CBC WITH DIFFERENTIAL/PLATELET
BASOS ABS: 0 10*3/uL (ref 0.0–0.1)
Basophils Relative: 0.3 % (ref 0.0–3.0)
EOS ABS: 0.4 10*3/uL (ref 0.0–0.7)
Eosinophils Relative: 6.2 % — ABNORMAL HIGH (ref 0.0–5.0)
HCT: 43.8 % (ref 39.0–52.0)
Hemoglobin: 15 g/dL (ref 13.0–17.0)
LYMPHS ABS: 1.8 10*3/uL (ref 0.7–4.0)
Lymphocytes Relative: 27.7 % (ref 12.0–46.0)
MCHC: 34.2 g/dL (ref 30.0–36.0)
MCV: 96.3 fl (ref 78.0–100.0)
MONO ABS: 0.6 10*3/uL (ref 0.1–1.0)
MONOS PCT: 8.9 % (ref 3.0–12.0)
NEUTROS ABS: 3.7 10*3/uL (ref 1.4–7.7)
NEUTROS PCT: 56.9 % (ref 43.0–77.0)
PLATELETS: 118 10*3/uL — AB (ref 150.0–400.0)
RBC: 4.54 Mil/uL (ref 4.22–5.81)
RDW: 13.7 % (ref 11.5–15.5)
WBC: 6.5 10*3/uL (ref 4.0–10.5)

## 2015-04-20 LAB — POC URINALSYSI DIPSTICK (AUTOMATED)
Bilirubin, UA: NEGATIVE
Glucose, UA: NEGATIVE
Ketones, UA: NEGATIVE
Leukocytes, UA: NEGATIVE
NITRITE UA: NEGATIVE
PROTEIN UA: NEGATIVE
SPEC GRAV UA: 1.025
UROBILINOGEN UA: 1
pH, UA: 6

## 2015-04-20 LAB — MICROALBUMIN / CREATININE URINE RATIO
Creatinine,U: 113 mg/dL
Microalb Creat Ratio: 38.1 mg/g — ABNORMAL HIGH (ref 0.0–30.0)
Microalb, Ur: 43 mg/dL — ABNORMAL HIGH (ref 0.0–1.9)

## 2015-04-20 LAB — HEMOGLOBIN A1C: Hgb A1c MFr Bld: 8.1 % — ABNORMAL HIGH (ref 4.6–6.5)

## 2015-04-20 LAB — TSH: TSH: 3.9 u[IU]/mL (ref 0.35–4.50)

## 2015-04-20 LAB — PSA: PSA: 0.56 ng/mL (ref 0.10–4.00)

## 2015-04-27 ENCOUNTER — Encounter: Payer: Self-pay | Admitting: Adult Health

## 2015-04-27 ENCOUNTER — Ambulatory Visit (INDEPENDENT_AMBULATORY_CARE_PROVIDER_SITE_OTHER): Payer: BLUE CROSS/BLUE SHIELD | Admitting: Adult Health

## 2015-04-27 VITALS — BP 140/58 | Temp 98.8°F | Ht 69.0 in | Wt 331.8 lb

## 2015-04-27 DIAGNOSIS — Z0000 Encounter for general adult medical examination without abnormal findings: Secondary | ICD-10-CM

## 2015-04-27 DIAGNOSIS — K760 Fatty (change of) liver, not elsewhere classified: Secondary | ICD-10-CM | POA: Diagnosis not present

## 2015-04-27 DIAGNOSIS — I10 Essential (primary) hypertension: Secondary | ICD-10-CM | POA: Diagnosis not present

## 2015-04-27 DIAGNOSIS — E1141 Type 2 diabetes mellitus with diabetic mononeuropathy: Secondary | ICD-10-CM

## 2015-04-27 DIAGNOSIS — Z794 Long term (current) use of insulin: Secondary | ICD-10-CM

## 2015-04-27 DIAGNOSIS — E669 Obesity, unspecified: Secondary | ICD-10-CM

## 2015-04-27 MED ORDER — LORCASERIN HCL 10 MG PO TABS: 10.0000 mg | ORAL_TABLET | Freq: Two times a day (BID) | ORAL | Status: DC

## 2015-04-27 NOTE — Patient Instructions (Addendum)
It was great seeing you today!  Please work on diet and exercise, your A1c has increased to 8.1.   Continue to follow up with Endocrinology   I have prescribed Belviq, please let me know how it works with you.   Let me know if you need anything.      Health Maintenance, Male A healthy lifestyle and preventative care can promote health and wellness.  Maintain regular health, dental, and eye exams.  Eat a healthy diet. Foods like vegetables, fruits, whole grains, low-fat dairy products, and lean protein foods contain the nutrients you need and are low in calories. Decrease your intake of foods high in solid fats, added sugars, and salt. Get information about a proper diet from your health care provider, if necessary.  Regular physical exercise is one of the most important things you can do for your health. Most adults should get at least 150 minutes of moderate-intensity exercise (any activity that increases your heart rate and causes you to sweat) each week. In addition, most adults need muscle-strengthening exercises on 2 or more days a week.   Maintain a healthy weight. The body mass index (BMI) is a screening tool to identify possible weight problems. It provides an estimate of body fat based on height and weight. Your health care provider can find your BMI and can help you achieve or maintain a healthy weight. For males 20 years and older:  A BMI below 18.5 is considered underweight.  A BMI of 18.5 to 24.9 is normal.  A BMI of 25 to 29.9 is considered overweight.  A BMI of 30 and above is considered obese.  Maintain normal blood lipids and cholesterol by exercising and minimizing your intake of saturated fat. Eat a balanced diet with plenty of fruits and vegetables. Blood tests for lipids and cholesterol should begin at age 66 and be repeated every 5 years. If your lipid or cholesterol levels are high, you are over age 66, or you are at high risk for heart disease, you may need  your cholesterol levels checked more frequently.Ongoing high lipid and cholesterol levels should be treated with medicines if diet and exercise are not working.  If you smoke, find out from your health care provider how to quit. If you do not use tobacco, do not start.  Lung cancer screening is recommended for adults aged 55-80 years who are at high risk for developing lung cancer because of a history of smoking. A yearly low-dose CT scan of the lungs is recommended for people who have at least a 30-pack-year history of smoking and are current smokers or have quit within the past 15 years. A pack year of smoking is smoking an average of 1 pack of cigarettes a day for 1 year (for example, a 30-pack-year history of smoking could mean smoking 1 pack a day for 30 years or 2 packs a day for 15 years). Yearly screening should continue until the smoker has stopped smoking for at least 15 years. Yearly screening should be stopped for people who develop a health problem that would prevent them from having lung cancer treatment.  If you choose to drink alcohol, do not have more than 2 drinks per day. One drink is considered to be 12 oz (360 mL) of beer, 5 oz (150 mL) of wine, or 1.5 oz (45 mL) of liquor.  Avoid the use of street drugs. Do not share needles with anyone. Ask for help if you need support or instructions about stopping  the use of drugs.  High blood pressure causes heart disease and increases the risk of stroke. High blood pressure is more likely to develop in:  People who have blood pressure in the end of the normal range (100-139/85-89 mm Hg).  People who are overweight or obese.  People who are African American.  If you are 73-20 years of age, have your blood pressure checked every 3-5 years. If you are 62 years of age or older, have your blood pressure checked every year. You should have your blood pressure measured twice--once when you are at a hospital or clinic, and once when you are not  at a hospital or clinic. Record the average of the two measurements. To check your blood pressure when you are not at a hospital or clinic, you can use:  An automated blood pressure machine at a pharmacy.  A home blood pressure monitor.  If you are 59-51 years old, ask your health care provider if you should take aspirin to prevent heart disease.  Diabetes screening involves taking a blood sample to check your fasting blood sugar level. This should be done once every 3 years after age 55 if you are at a normal weight and without risk factors for diabetes. Testing should be considered at a younger age or be carried out more frequently if you are overweight and have at least 1 risk factor for diabetes.  Colorectal cancer can be detected and often prevented. Most routine colorectal cancer screening begins at the age of 98 and continues through age 53. However, your health care provider may recommend screening at an earlier age if you have risk factors for colon cancer. On a yearly basis, your health care provider may provide home test kits to check for hidden blood in the stool. A small camera at the end of a tube may be used to directly examine the colon (sigmoidoscopy or colonoscopy) to detect the earliest forms of colorectal cancer. Talk to your health care provider about this at age 82 when routine screening begins. A direct exam of the colon should be repeated every 5-10 years through age 80, unless early forms of precancerous polyps or small growths are found.  People who are at an increased risk for hepatitis B should be screened for this virus. You are considered at high risk for hepatitis B if:  You were born in a country where hepatitis B occurs often. Talk with your health care provider about which countries are considered high risk.  Your parents were born in a high-risk country and you have not received a shot to protect against hepatitis B (hepatitis B vaccine).  You have HIV or  AIDS.  You use needles to inject street drugs.  You live with, or have sex with, someone who has hepatitis B.  You are a man who has sex with other men (MSM).  You get hemodialysis treatment.  You take certain medicines for conditions like cancer, organ transplantation, and autoimmune conditions.  Hepatitis C blood testing is recommended for all people born from [birth year] through 1965 and any individual with known risk factors for hepatitis C.  Healthy men should no longer receive prostate-specific antigen (PSA) blood tests as part of routine cancer screening. Talk to your health care provider about prostate cancer screening.  Testicular cancer screening is not recommended for adolescents or adult males who have no symptoms. Screening includes self-exam, a health care provider exam, and other screening tests. Consult with your health care provider about any  symptoms you have or any concerns you have about testicular cancer.  Practice safe sex. Use condoms and avoid high-risk sexual practices to reduce the spread of sexually transmitted infections (STIs).  You should be screened for STIs, including gonorrhea and chlamydia if:  You are sexually active and are younger than 24 years.  You are older than 24 years, and your health care provider tells you that you are at risk for this type of infection.  Your sexual activity has changed since you were last screened, and you are at an increased risk for chlamydia or gonorrhea. Ask your health care provider if you are at risk.  If you are at risk of being infected with HIV, it is recommended that you take a prescription medicine daily to prevent HIV infection. This is called pre-exposure prophylaxis (PrEP). You are considered at risk if:  You are a man who has sex with other men (MSM).  You are a heterosexual man who is sexually active with multiple partners.  You take drugs by injection.  You are sexually active with a partner who has  HIV.  Talk with your health care provider about whether you are at high risk of being infected with HIV. If you choose to begin PrEP, you should first be tested for HIV. You should then be tested every 3 months for as long as you are taking PrEP.  Use sunscreen. Apply sunscreen liberally and repeatedly throughout the day. You should seek shade when your shadow is shorter than you. Protect yourself by wearing long sleeves, pants, a wide-brimmed hat, and sunglasses year round whenever you are outdoors.  Tell your health care provider of new moles or changes in moles, especially if there is a change in shape or color. Also, tell your health care provider if a mole is larger than the size of a pencil eraser.  A one-time screening for abdominal aortic aneurysm (AAA) and surgical repair of large AAAs by ultrasound is recommended for men aged 82-75 years who are current or former smokers.  Stay current with your vaccines (immunizations).   This information is not intended to replace advice given to you by your health care provider. Make sure you discuss any questions you have with your health care provider.   Document Released: 07/05/2007 Document Revised: 01/27/2014 Document Reviewed: 06/03/2010 Elsevier Interactive Patient Education [employer].
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
## 2015-05-14 ENCOUNTER — Other Ambulatory Visit: Payer: Self-pay | Admitting: Internal Medicine

## 2015-06-19 ENCOUNTER — Other Ambulatory Visit: Payer: Self-pay | Admitting: Internal Medicine

## 2015-06-28 ENCOUNTER — Ambulatory Visit (INDEPENDENT_AMBULATORY_CARE_PROVIDER_SITE_OTHER): Payer: BLUE CROSS/BLUE SHIELD | Admitting: Adult Health

## 2015-06-28 ENCOUNTER — Encounter: Payer: Self-pay | Admitting: Adult Health

## 2015-06-28 VITALS — BP 138/78 | Temp 97.8°F | Wt 318.9 lb

## 2015-06-28 DIAGNOSIS — E669 Obesity, unspecified: Secondary | ICD-10-CM

## 2015-06-28 MED ORDER — LORCASERIN HCL 10 MG PO TABS: 10.0000 mg | ORAL_TABLET | Freq: Two times a day (BID) | ORAL | Status: DC

## 2015-06-28 NOTE — Patient Instructions (Signed)
It was great seeing you today!  I am very happy and very proud of you for the weight loss. Keep up the good work  Increase exercise and continue to eat healthy.   Follow up in one month
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
## 2015-07-12 ENCOUNTER — Encounter: Payer: Self-pay | Admitting: Internal Medicine

## 2015-07-12 ENCOUNTER — Ambulatory Visit (INDEPENDENT_AMBULATORY_CARE_PROVIDER_SITE_OTHER): Payer: BLUE CROSS/BLUE SHIELD | Admitting: Internal Medicine

## 2015-07-12 ENCOUNTER — Ambulatory Visit: Payer: BLUE CROSS/BLUE SHIELD | Admitting: Internal Medicine

## 2015-07-12 VITALS — BP 110/58 | HR 98 | Wt 316.4 lb

## 2015-07-12 DIAGNOSIS — E1165 Type 2 diabetes mellitus with hyperglycemia: Secondary | ICD-10-CM | POA: Diagnosis not present

## 2015-07-12 DIAGNOSIS — Z794 Long term (current) use of insulin: Secondary | ICD-10-CM

## 2015-07-12 DIAGNOSIS — IMO0002 Reserved for concepts with insufficient information to code with codable children: Secondary | ICD-10-CM

## 2015-07-12 DIAGNOSIS — E1142 Type 2 diabetes mellitus with diabetic polyneuropathy: Secondary | ICD-10-CM | POA: Diagnosis not present

## 2015-07-12 LAB — POCT GLYCOSYLATED HEMOGLOBIN (HGB A1C): Hemoglobin A1C: 6.2

## 2015-07-12 MED ORDER — INSULIN REGULAR HUMAN (CONC) 500 UNIT/ML ~~LOC~~ SOLN: SUBCUTANEOUS | Status: DC

## 2015-07-12 NOTE — Progress Notes (Signed)
Patient ID: Billy Hale, male   DOB: 06/07/1950, 65 y.o.   MRN: [id number]  HPI: Billy Hale is a 65 y.o.-year-old male, returning for DM2, dx 2004, insulin-dependent, uncontrolled, with complications (peripheral neuropathy). Last visit 3 mo ago.  Since his last visit, he starte don Belviq x 1 mo >> lost 15 lbs and his sugars are much better!  Last hemoglobin A1c:  Lab Results  Component Value Date   HGBA1C 8.1* 04/20/2015   HGBA1C 7.5 04/05/2015   HGBA1C 7.4 01/04/2015   He is on: - Metformin 2000 mg with dinner. - Invokana 300 mg daily  - U500 insulin:   0.20 mL, 20 units on the syringe before a small meal  0.28 mL, 28 units on the syringe  before a large meal   Pt checks his sugars 3-4 a day - brings log: - am: 156-199, 265 >> 80 x3, 100-120 >> 135, 165-181, 239 >> 149-194, 203 >>  136-204, 286 >> 110-149, 170, 216 - 2h after b'fast: 390s >> not checking >> n/c >> 112-153 (238 x1) >> n/c >> 182 >> n/c - before lunch: 140-187 (317 x1) >> 150-223 >> 125-158 >> 158-175, 214, 239 >> 134-175, 189 >> 134-171 >> 107-148 - 2h after lunch: 219 >> 135-199 >> n/c >> 119-177 - dinner:121-193 >> 159-229 >> 160s, 250x2 >> 118-192, 239 >> 103, 118-162, 183 >> 118-234 >> 71-153 - bedtime: 280-390 >> 196-284 >> 83-177 (236 x1) >> 88, 157, 198 >> n/c >> 40x1, 81-185 - nighttime: 225, 250 >> 178-221 >> 117, 142 >> 74  he has hypoglycemia awareness at 70. His highest sugar was 400s >> 203 >> 200s.  Pt does not have chronic kidney disease, last BUN/creatinine was:  Lab Results  Component Value Date   BUN 18 04/20/2015   CREATININE 0.70 04/20/2015  He is on Valsartan. Last set of lipids: Lab Results  Component Value Date   CHOL 123 04/20/2015   HDL 36.90* 04/20/2015   LDLCALC 60 04/20/2015   LDLDIRECT 72.9 11/01/2010   TRIG 135.0 04/20/2015   CHOLHDL 3 04/20/2015  He is on Simvastatin. Pt's last eye exam was 02/23/2014. No DR reportedly; had cataracts.  Denies numbness and tingling in  his legs. Foot exam performed 09/2014.  He also has a history of mild transaminitis, HTN- on Diovan HCT, Amlodipine, HL - on statin, obesity, depression, hypogonadism, OSA - compliant with CPAP.   I reviewed pt's medications, allergies, PMH, social hx, family hx, and changes were documented in the history of present illness. Otherwise, unchanged from my initial visit note.  ROS: Constitutional: no weight gain/loss, no fatigue, no subjective hyperthermia/hypothermia Eyes: no blurry vision, no xerophthalmia ENT: no sore throat, no nodules palpated in throat, no dysphagia/odynophagia, no hoarseness Cardiovascular: no CP/SOB/palpitations/leg swelling Respiratory: no cough/SOB Gastrointestinal: no N/V/D/C Musculoskeletal: no muscle/joint aches Skin: no rashes Neurological: no tremors/numbness/tingling/dizziness   PE: BP 110/58 mmHg  Pulse 98  Wt 316 lb 6.4 oz (143.518 kg)  SpO2 94% Body mass index is 46.7 kg/(m^2). Wt Readings from Last 3 Encounters:  07/12/15 316 lb 6.4 oz (143.518 kg)  06/28/15 318 lb 14.4 oz (144.652 kg)  04/27/15 331 lb 12.8 oz (150.503 kg)   Constitutional: obese, in NAD, large neck, pale Eyes: PERRLA, EOMI, no exophthalmos ENT: moist mucous membranes, no thyromegaly, no cervical lymphadenopathy, Mallampati 3.5 Cardiovascular: Tachycardia, No MRG, +bilateral leg edema  Respiratory: CTA B Gastrointestinal: abdomen soft, NT, ND, BS+ Musculoskeletal: no deformities, strength intact in all 4 Skin: moist, warm,  no rashes  ASSESSMENT: 1. DM2, insulin-dependent, uncontrolled, with complications and increased insulin resistance; on U500 - peripheral neuropathy  PLAN:   1. Patient with poor diabetes control, with sugars much better at this visit after he started Belviq and started to lose weight! He even had a low CBg: 40 after dinner >> will reduce his U500 insulin doses: - I advised him to: Patient Instructions  Please continue:  - Metformin 1000 mg 2x a  day - Invokana 300 mg daily in am  Please decrease: - U500 insulin:   0.18 mL, 18 units on the syringe before a small meal  0.22 mL, 22 units on the syringe  before a large meal  Please return in 1.5 months with your sugar log.   - needs a new eye exam - check HbA1c today >> 6.2% (much better!) - RTC in 1.5 mos with the sugar log

## 2015-07-12 NOTE — Addendum Note (Signed)
Addended by: Dixie DialsVALENCIA, Maurissa Ambrose on: 07/12/2015 10:50 AM   Modules accepted: Orders

## 2015-07-12 NOTE — Patient Instructions (Signed)
Patient Instructions  Please continue:  - Metformin 1000 mg 2x a day - Invokana 300 mg daily in am  Please decrease: - U500 insulin:   0.18 mL, 18 units on the syringe before a small meal  0.22 mL, 22 units on the syringe  before a large meal  Please return in 1.5 months with your sugar log.

## 2015-07-14 ENCOUNTER — Other Ambulatory Visit: Payer: Self-pay | Admitting: Internal Medicine

## 2015-07-27 ENCOUNTER — Encounter: Payer: Self-pay | Admitting: Adult Health

## 2015-07-27 ENCOUNTER — Other Ambulatory Visit: Payer: Self-pay

## 2015-07-27 ENCOUNTER — Encounter: Payer: Self-pay | Admitting: Internal Medicine

## 2015-07-27 ENCOUNTER — Other Ambulatory Visit: Payer: Self-pay | Admitting: Internal Medicine

## 2015-07-27 ENCOUNTER — Ambulatory Visit (INDEPENDENT_AMBULATORY_CARE_PROVIDER_SITE_OTHER): Payer: BLUE CROSS/BLUE SHIELD | Admitting: Adult Health

## 2015-07-27 VITALS — BP 110/54 | Temp 98.3°F | Ht 69.0 in | Wt 317.8 lb

## 2015-07-27 DIAGNOSIS — E669 Obesity, unspecified: Secondary | ICD-10-CM

## 2015-07-27 DIAGNOSIS — E1165 Type 2 diabetes mellitus with hyperglycemia: Secondary | ICD-10-CM

## 2015-07-27 DIAGNOSIS — Z794 Long term (current) use of insulin: Secondary | ICD-10-CM | POA: Diagnosis not present

## 2015-07-27 MED ORDER — LORCASERIN HCL 10 MG PO TABS: 10.0000 mg | ORAL_TABLET | Freq: Two times a day (BID) | ORAL | Status: DC

## 2015-07-27 MED ORDER — GLUCOSE BLOOD VI STRP: ORAL_STRIP | Status: DC

## 2015-07-27 NOTE — Patient Instructions (Signed)
It was great seeing you again  Increase exercise, keep diet the same  [facility]
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
## 2015-07-31 ENCOUNTER — Telehealth: Payer: Self-pay | Admitting: Internal Medicine

## 2015-07-31 MED ORDER — SERTRALINE HCL 100 MG PO TABS: 100.0000 mg | ORAL_TABLET | Freq: Every day | ORAL | Status: DC

## 2015-07-31 NOTE — Addendum Note (Signed)
Addended by: Janelle FloorHOMPSON, Santanna Olenik B on: 07/31/2015 01:31 PM   Modules accepted: Orders

## 2015-07-31 NOTE — Telephone Encounter (Signed)
Patient called the [facility] medical call center, needing a refill of medication but didn't say what it was.

## 2015-07-31 NOTE — Telephone Encounter (Signed)
[facility] [address] called and needs the testing frequency for the test strips.

## 2015-08-03 ENCOUNTER — Ambulatory Visit: Payer: BLUE CROSS/BLUE SHIELD | Admitting: Internal Medicine

## 2015-08-14 ENCOUNTER — Other Ambulatory Visit: Payer: Self-pay | Admitting: Internal Medicine

## 2015-09-07 ENCOUNTER — Encounter: Payer: Self-pay | Admitting: Adult Health

## 2015-09-11 ENCOUNTER — Ambulatory Visit (INDEPENDENT_AMBULATORY_CARE_PROVIDER_SITE_OTHER): Payer: BLUE CROSS/BLUE SHIELD | Admitting: Internal Medicine

## 2015-09-11 ENCOUNTER — Encounter: Payer: Self-pay | Admitting: Internal Medicine

## 2015-09-11 VITALS — BP 138/70 | HR 94 | Ht 67.5 in | Wt 320.0 lb

## 2015-09-11 DIAGNOSIS — E1165 Type 2 diabetes mellitus with hyperglycemia: Secondary | ICD-10-CM | POA: Diagnosis not present

## 2015-09-11 DIAGNOSIS — Z794 Long term (current) use of insulin: Secondary | ICD-10-CM

## 2015-09-11 NOTE — Patient Instructions (Addendum)
Please continue: - Metformin 1000 mg 2x a day - Invokana 300 mg daily in am  Please change U500 insulin: - 10 units before a very small meal - 16-18 units before a regular meal  - 26 units before a large meal  Please return in 1.5 months with your sugar log.

## 2015-09-11 NOTE — Progress Notes (Signed)
Patient ID: Billy Hale, male   DOB: 07/12/1950, 65 y.o.   MRN: [id number]  HPI: Billy Hale is a 65 y.o.-year-old male, returning for DM2, dx 2004, insulin-dependent, uncontrolled, with complications (peripheral neuropathy). Last visit 2 mo ago.  Since his last visit, he started on Belviq >> lost 15 lbs and his sugars are much better! >> ran out 1 mo ago >> weight up 4 lbs. Sugars are higher. He will restart.  Last hemoglobin A1c:  Lab Results  Component Value Date   HGBA1C 6.2 07/12/2015   HGBA1C 8.1 (H) 04/20/2015   HGBA1C 7.5 04/05/2015   He is on: - Metformin 1000 mg 2x a day - Invokana 300 mg daily in am: - U500 insulin - 2 meals a dayStopped using insulin before a smaller meal  0.26 mL, 26 units on the syringe  before a large meal  Pt checks his sugars 3-4 a day - brings log - higher: - am: 156-199, 265 >> 80 x3, 100-120 >> 135, 165-181, 239 >> 149-194, 203 >>  136-204, 286 >> 110-149, 170, 216 >> 120-158, 170 - 2h after b'fast: 390s >> not checking >> n/c >> 112-153 (238 x1) >> n/c >> 182 >> n/c - before lunch: 140-187 (317 x1) >> 150-223 >> 125-158 >> 158-175, 214, 239 >> 134-175, 189 >> 134-171 >> 107-148 >> 129-148 - 2h after lunch: 219 >> 135-199 >> n/c >> 119-177 >> 119--177 - dinner:121-193 >> 159-229 >> 160s, 250x2 >> 118-192, 239 >> 103, 118-162, 183 >> 118-234 >> 71-153 >> 118-178 - bedtime: 280-390 >> 196-284 >> 83-177 (236 x1) >> 88, 157, 198 >> n/c >> 40x1, 81-185 >> 159, 185 - nighttime: 225, 250 >> 178-221 >> 117, 142 >> 74 >> 199  he has hypoglycemia awareness at 70. His highest sugar was 400s >> 203 >> 200s.  Pt does not have chronic kidney disease, last BUN/creatinine was:  Lab Results  Component Value Date   BUN 18 04/20/2015   CREATININE 0.70 04/20/2015  He is on Valsartan. Last set of lipids: Lab Results  Component Value Date   CHOL 123 04/20/2015   HDL 36.90 (L) 04/20/2015   LDLCALC 60 04/20/2015   LDLDIRECT 72.9 11/01/2010   TRIG 135.0  04/20/2015   CHOLHDL 3 04/20/2015  He is on Simvastatin. Pt's last eye exam was 02/23/2014. No DR reportedly; had cataracts.  Denies numbness and tingling in his legs. Foot exam performed 09/2014.  He also has a history of mild transaminitis, HTN- on Diovan HCT, Amlodipine, HL - on statin, obesity, depression, hypogonadism, OSA - compliant with CPAP.   I reviewed pt's medications, allergies, PMH, social hx, family hx, and changes were documented in the history of present illness. Otherwise, unchanged from my initial visit note.  ROS: Constitutional: + weight gain, no fatigue, no subjective hyperthermia/hypothermia Eyes: no blurry vision, no xerophthalmia ENT: no sore throat, no nodules palpated in throat, no dysphagia/odynophagia, no hoarseness Cardiovascular: no CP/SOB/palpitations/leg swelling Respiratory: no cough/SOB Gastrointestinal: no N/V/D/C Musculoskeletal: no muscle/joint aches Skin: no rashes Neurological: no tremors/numbness/tingling/dizziness   PE: BP 138/70 (BP Location: Left Arm, Patient Position: Sitting)   Pulse 94   Ht 5' 7.5" (1.715 m)   Wt (!) 320 lb (145.2 kg)   SpO2 94%   BMI 49.38 kg/m  Body mass index is 49.38 kg/m. Wt Readings from Last 3 Encounters:  09/11/15 (!) 320 lb (145.2 kg)  07/27/15 (!) 317 lb 12.8 oz (144.2 kg)  07/12/15 (!) 316 lb 6.4 oz (143.5 kg)  Constitutional: obese, in NAD, large neck, pale Eyes: PERRLA, EOMI, no exophthalmos ENT: moist mucous membranes, no thyromegaly, no cervical lymphadenopathy, Mallampati 3.5 Cardiovascular: Tachycardia, No MRG, +bilateral leg edema  Respiratory: CTA B Gastrointestinal: abdomen soft, NT, ND, BS+ Musculoskeletal: no deformities, strength intact in all 4 Skin: moist, warm, no rashes  ASSESSMENT: 1. DM2, insulin-dependent, uncontrolled, with complications and increased insulin resistance; on U500 - peripheral neuropathy  PLAN:  1. Patient with poor diabetes control, with sugars much better  at this visit after he started Belviq and started to lose weight! He even had low CBGs: 40 after dinner, some 60's and 70's >> we reduced his U500 insulin doses. However, since last visit, sugars are orse as he stopped Belviq and had more dietary indiscretions. He was also not taking U500 unless planning to eat large meals... >> will give him a more flexible dosing schedule.  - last HbA1c was great: 6.2% - I advised him to: Patient Instructions  Please continue: - Metformin 1000 mg 2x a day - Invokana 300 mg daily in am  Please change U500 insulin: - 10 units before a very small meal - 16-18 units before a regular meal  - 26 units before a large meal  Please return in 1.5 months with your sugar log.    - needs a new eye exam  - RTC in 1.5 mos with the sugar log  Carlus Pavlovristina Minerva Bluett, MD PhD [facility]

## 2015-09-12 ENCOUNTER — Ambulatory Visit (INDEPENDENT_AMBULATORY_CARE_PROVIDER_SITE_OTHER): Payer: BLUE CROSS/BLUE SHIELD | Admitting: Adult Health

## 2015-09-12 ENCOUNTER — Encounter: Payer: Self-pay | Admitting: Adult Health

## 2015-09-12 VITALS — BP 138/64 | Temp 98.0°F | Ht 67.5 in | Wt 320.4 lb

## 2015-09-12 DIAGNOSIS — E669 Obesity, unspecified: Secondary | ICD-10-CM

## 2015-09-12 DIAGNOSIS — I10 Essential (primary) hypertension: Secondary | ICD-10-CM

## 2015-09-12 MED ORDER — PHENTERMINE HCL 30 MG PO CAPS: 30.0000 mg | ORAL_CAPSULE | ORAL | 0 refills | Status: DC
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
## 2015-09-12 NOTE — Patient Instructions (Signed)
I have changed your prescription from Belviq to phentermine. Try this for a month, if you have any symptoms that we discussed, please let me know.   Follow up in one month

## 2015-09-20 ENCOUNTER — Other Ambulatory Visit: Payer: Self-pay | Admitting: Internal Medicine

## 2015-10-12 ENCOUNTER — Encounter: Payer: Self-pay | Admitting: Adult Health

## 2015-10-12 ENCOUNTER — Ambulatory Visit (INDEPENDENT_AMBULATORY_CARE_PROVIDER_SITE_OTHER): Payer: BLUE CROSS/BLUE SHIELD | Admitting: Adult Health

## 2015-10-12 DIAGNOSIS — E669 Obesity, unspecified: Secondary | ICD-10-CM

## 2015-10-12 LAB — HM DIABETES EYE EXAM

## 2015-10-12 MED ORDER — PHENTERMINE HCL 37.5 MG PO CAPS: 37.5000 mg | ORAL_CAPSULE | ORAL | 0 refills | Status: DC
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
## 2015-10-15 ENCOUNTER — Other Ambulatory Visit: Payer: Self-pay | Admitting: Internal Medicine

## 2015-11-08 ENCOUNTER — Encounter: Payer: Self-pay | Admitting: Internal Medicine

## 2015-11-08 ENCOUNTER — Ambulatory Visit (INDEPENDENT_AMBULATORY_CARE_PROVIDER_SITE_OTHER): Payer: BLUE CROSS/BLUE SHIELD | Admitting: Internal Medicine

## 2015-11-08 VITALS — BP 132/84 | HR 82 | Ht 68.0 in | Wt 316.0 lb

## 2015-11-08 DIAGNOSIS — Z794 Long term (current) use of insulin: Secondary | ICD-10-CM

## 2015-11-08 DIAGNOSIS — E1165 Type 2 diabetes mellitus with hyperglycemia: Secondary | ICD-10-CM

## 2015-11-08 LAB — POCT GLYCOSYLATED HEMOGLOBIN (HGB A1C): HEMOGLOBIN A1C: 6.6

## 2015-11-08 NOTE — Patient Instructions (Addendum)
Please continue: - Metformin 1000 mg 2x a day - Invokana 300 mg daily in am - U500 insulin: - 10 units before a very small meal - 16-18 units before a regular meal  - 26 units before a large meal  Please return in 3 months with your sugar log.

## 2015-11-08 NOTE — Progress Notes (Signed)
Patient ID: Billy PandaJoseph Kunin, male   DOB: 11/08/1950, 65 y.o.   MRN: [id number]  HPI: Billy Hale is a 65 y.o.-year-old male, returning for DM2, dx 2004, insulin-dependent, uncontrolled, with complications (peripheral neuropathy). Last visit 2 mo ago.  Since his last visit, he started on Belviq >> lost 15 lbs and his sugars are much better! >> ran out 1 mo ago >> weight up 4 lbs. Sugars are higher. He will restart.  Last hemoglobin A1c:  Lab Results  Component Value Date   HGBA1C 6.2 07/12/2015   HGBA1C 8.1 (H) 04/20/2015   HGBA1C 7.5 04/05/2015   He is on: - Metformin 1000 mg 2x a day - Invokana 300 mg daily in am: - U500 insulin - 2 meals a day:- 10 units before a very small meal - 16-18 units before a regular meal  - 26 units before a large meal Pt checks his sugars 3-4 a day - brings log but he printed the one from last visit... Reviewed the ones from last visit: - am: 135, 165-181, 239 >> 149-194, 203 >>  136-204, 286 >> 110-149, 170, 216 >> 120-158, 170 - 2h after b'fast: 390s >> not checking >> n/c >> 112-153 (238 x1) >> n/c >> 182 >> n/c - before lunch: 125-158 >> 158-175, 214, 239 >> 134-175, 189 >> 134-171 >> 107-148 >> 129-148  - 2h after lunch: 219 >> 135-199 >> n/c >> 119-177 >> 119--177 - dinner:160s, 250x2 >> 118-192, 239 >> 103, 118-162, 183 >> 118-234 >> 71-153 >> 118-178 - bedtime: 280-390 >> 196-284 >> 83-177 (236 x1) >> 88, 157, 198 >> n/c >> 40x1, 81-185 >> 159, 185 - nighttime: 225, 250 >> 178-221 >> 117, 142 >> 74 >> 199  he has hypoglycemia awareness at 70. His highest sugar was 400s >> 203 >> 200s.  Pt does not have chronic kidney disease, last BUN/creatinine was:  Lab Results  Component Value Date   BUN 18 04/20/2015   CREATININE 0.70 04/20/2015  He is on Valsartan. Last set of lipids: Lab Results  Component Value Date   CHOL 123 04/20/2015   HDL 36.90 (L) 04/20/2015   LDLCALC 60 04/20/2015   LDLDIRECT 72.9 11/01/2010   TRIG 135.0 04/20/2015   CHOLHDL  3 04/20/2015  He is on Simvastatin. Pt's last eye exam was 10/12/2015 No DR reportedly; had cataracts.  Denies numbness and tingling in his legs. Foot exam performed 09/2014.  He also has a history of mild transaminitis, HTN- on Diovan HCT, Amlodipine, HL - on statin, obesity, depression, hypogonadism, OSA - compliant with CPAP.   I reviewed pt's medications, allergies, PMH, social hx, family hx, and changes were documented in the history of present illness. Otherwise, unchanged from my initial visit note.  ROS: Constitutional: + weight loss, no fatigue, no subjective hyperthermia/hypothermia Eyes: no blurry vision, no xerophthalmia ENT: no sore throat, no nodules palpated in throat, no dysphagia/odynophagia, no hoarseness Cardiovascular: no CP/SOB/palpitations/leg swelling Respiratory: no cough/SOB Gastrointestinal: no N/V/D/C Musculoskeletal: no muscle/joint aches Skin: no rashes Neurological: no tremors/numbness/tingling/dizziness  PE: BP 132/84 (BP Location: Left Arm, Patient Position: Sitting)   Pulse 82   Ht 5\' 8"  (1.727 m)   Wt (!) 316 lb (143.3 kg)   SpO2 97%   BMI 48.05 kg/m  Body mass index is 48.05 kg/m. Wt Readings from Last 3 Encounters:  11/08/15 (!) 316 lb (143.3 kg)  10/12/15 (!) 315 lb 4.8 oz (143 kg)  09/12/15 (!) 320 lb 6.4 oz (145.3 kg)   Constitutional:  obese, in NAD, large neck, pale Eyes: PERRLA, EOMI, no exophthalmos ENT: moist mucous membranes, no thyromegaly, no cervical lymphadenopathy, Mallampati 3.5 Cardiovascular: Tachycardia, No MRG, +bilateral leg edema  Respiratory: CTA B Gastrointestinal: abdomen soft, NT, ND, BS+ Musculoskeletal: no deformities, strength intact in all 4 Skin: moist, warm, no rashes  ASSESSMENT: 1. DM2, insulin-dependent, uncontrolled, with complications and increased insulin resistance; on U500 - peripheral neuropathy  PLAN:  1. Patient with poor diabetes control, with sugars much better after started Belviq - last  HbA1c was great: 6.2% >> but he needed to come off as this was not covered anymore >> now on Phentermine >> lost 4 lbs in last 1.5 mo.HbA1c today: 6.6% (slightly higher) today but no CBGs >> will not make changes in his regimen for now. - I advised him to: Patient Instructions  Please continue: - Metformin 1000 mg 2x a day - Invokana 300 mg daily in am - U500 insulin: - 10 units before a very small meal - 16-18 units before a regular meal  - 26 units before a large meal  Please return in 3 months with your sugar log.    - he is UTD with eye exams - refuses flu shot today - RTC in 3 mo with the sugar log  [name], MD PhD [facility]

## 2015-11-08 NOTE — Addendum Note (Signed)
Addended by: Darene LamerHOMPSON, Maruice Pieroni T on: 11/08/2015 11:06 AM   Modules accepted: Orders

## 2015-11-16 ENCOUNTER — Other Ambulatory Visit: Payer: Self-pay | Admitting: Adult Health

## 2015-12-20 ENCOUNTER — Other Ambulatory Visit: Payer: Self-pay | Admitting: Adult Health

## 2015-12-28 ENCOUNTER — Other Ambulatory Visit: Payer: Self-pay | Admitting: Internal Medicine

## 2016-02-07 ENCOUNTER — Ambulatory Visit: Payer: BLUE CROSS/BLUE SHIELD | Admitting: Internal Medicine

## 2016-02-26 ENCOUNTER — Other Ambulatory Visit: Payer: Self-pay | Admitting: Internal Medicine

## 2016-03-04 ENCOUNTER — Other Ambulatory Visit: Payer: Self-pay | Admitting: Adult Health

## 2016-03-04 NOTE — Telephone Encounter (Signed)
Okay to refill for 6 months 

## 2016-03-07 ENCOUNTER — Other Ambulatory Visit: Payer: Self-pay

## 2016-03-07 MED ORDER — EMPAGLIFLOZIN 25 MG PO TABS: 25.0000 mg | ORAL_TABLET | Freq: Every day | ORAL | 0 refills | Status: DC

## 2016-03-07 MED ORDER — INSULIN REGULAR HUMAN (CONC) 500 UNIT/ML ~~LOC~~ SOLN: SUBCUTANEOUS | 1 refills | Status: DC

## 2016-03-23 ENCOUNTER — Other Ambulatory Visit: Payer: Self-pay | Admitting: Adult Health

## 2016-03-25 ENCOUNTER — Telehealth: Payer: Self-pay | Admitting: Internal Medicine

## 2016-03-25 NOTE — Telephone Encounter (Signed)
[facility] is asking what syringes patient do patient use for insulin insulin regular human CONCENTRATED (HUMULIN R) 500 UNIT/ML injection 60 mL   Please advise  [id number] , [place] [phone] (Phone) [phone] (Fax)

## 2016-03-28 ENCOUNTER — Encounter: Payer: Self-pay | Admitting: Internal Medicine

## 2016-03-28 ENCOUNTER — Ambulatory Visit (INDEPENDENT_AMBULATORY_CARE_PROVIDER_SITE_OTHER): Payer: PPO | Admitting: Internal Medicine

## 2016-03-28 VITALS — BP 124/78 | HR 96 | Ht 68.0 in | Wt 296.0 lb

## 2016-03-28 DIAGNOSIS — E1165 Type 2 diabetes mellitus with hyperglycemia: Secondary | ICD-10-CM

## 2016-03-28 DIAGNOSIS — Z794 Long term (current) use of insulin: Secondary | ICD-10-CM

## 2016-03-28 LAB — POCT GLYCOSYLATED HEMOGLOBIN (HGB A1C): HEMOGLOBIN A1C: 9.9

## 2016-03-28 MED ORDER — GLUCOSE BLOOD VI STRP: ORAL_STRIP | 3 refills | Status: DC

## 2016-03-28 MED ORDER — INSULIN REGULAR HUMAN (CONC) 500 UNIT/ML ~~LOC~~ SOLN: SUBCUTANEOUS | 1 refills | Status: DC

## 2016-03-28 NOTE — Patient Instructions (Addendum)
Please continue: - Metformin 1000 mg 2x a day - Invokana 300 mg daily in am  Restart: - U500 insulin: - 10 units before a very small meal - 16-18 units before a regular meal  - 26 units before a large meal  Please return in 3 months with your sugar log.

## 2016-03-28 NOTE — Addendum Note (Signed)
Addended by: Darene LamerHOMPSON, Rihaan Barrack T on: 03/28/2016 10:42 AM   Modules accepted: Orders

## 2016-03-28 NOTE — Progress Notes (Signed)
Patient ID: Billy Hale, male   DOB: 12-09-1950, 66 y.o.   MRN: [id number]  HPI: Baylee Hale is a 66 y.o.-year-old male, returning for DM2, dx 2004, insulin-dependent, uncontrolled, with complications (peripheral neuropathy). Last visit 5 mo ago.  He had to stop U500 3 mo ago >> not covered >> we completed a PA >> approved >> but 85$ per months. He ran out of Invokana in 12/2015 >> restarted 1 week ago.  Last hemoglobin A1c:  Lab Results  Component Value Date   HGBA1C 6.6 11/08/2015   HGBA1C 6.2 07/12/2015   HGBA1C 8.1 (H) 04/20/2015   He is on: - Metformin 1000 mg 2x a day - Invokana 300 mg daily in am: - U500 insulin - 2 meals a day:- 10 units before a very small meal - 16-18 units before a regular meal  - 26 units before a large meal Pt checks his sugars 2x a day: - am: 135, 165-181, 239 >> 149-194, 203 >>  136-204, 286 >> 110-149, 170, 216 >> 120-158, 170 >> 163, 205-355 - 2h after b'fast: 390s >> not checking >> n/c >> 112-153 (238 x1) >> n/c >> 182 >> n/c - before lunch: 125-158 >> 158-175, 214, 239 >> 134-175, 189 >> 134-171 >> 107-148 >> 129-148 >>> 196-417 - 2h after lunch: 219 >> 135-199 >> n/c >> 119-177 >> 119--177 >> 239 - dinner:160s, 250x2 >> 118-192, 239 >> 103, 118-162, 183 >> 118-234 >> 71-153 >> 118-178 >> 133, 234-308 - bedtime: 280-390 >> 196-284 >> 83-177 (236 x1) >> 88, 157, 198 >> n/c >> 40x1, 81-185 >> 159, 185 >> n/c - nighttime: 225, 250 >> 178-221 >> 117, 142 >> 74 >> 199 >> 196-233  he has hypoglycemia awareness at 70. His highest sugar was 400s >> 203 >> 200s >> 417.  Pt does not have chronic kidney disease, last BUN/creatinine was:  Lab Results  Component Value Date   BUN 18 04/20/2015   CREATININE 0.70 04/20/2015  He is on Valsartan. Last set of lipids: Lab Results  Component Value Date   CHOL 123 04/20/2015   HDL 36.90 (L) 04/20/2015   LDLCALC 60 04/20/2015   LDLDIRECT 72.9 11/01/2010   TRIG 135.0 04/20/2015   CHOLHDL 3 04/20/2015  He is  on Simvastatin. Pt's last eye exam was 10/12/2015 No DR reportedly; had cataracts.  Denies numbness and tingling in his legs.   He also has a history of mild transaminitis, HTN- on Diovan HCT, Amlodipine, HL - on statin, obesity, depression, hypogonadism, OSA - compliant with CPAP.   I reviewed pt's medications, allergies, PMH, social hx, family hx, and changes were documented in the history of present illness. Otherwise, unchanged from my initial visit note.  ROS: Constitutional: + weight loss, no fatigue, no subjective hyperthermia/hypothermia Eyes: no blurry vision, no xerophthalmia ENT: no sore throat, no nodules palpated in throat, no dysphagia/odynophagia, no hoarseness Cardiovascular: no CP/SOB/palpitations/leg swelling Respiratory: no cough/SOB Gastrointestinal: no N/V/D/C Musculoskeletal: no muscle/joint aches Skin: no rashes Neurological: no tremors/numbness/tingling/dizziness  PE: BP 124/78 (BP Location: Left Arm, Patient Position: Sitting)   Pulse 96   Ht 5\' 8"  (1.727 m)   Wt 296 lb (134.3 kg)   SpO2 96%   BMI 45.01 kg/m  Body mass index is 45.01 kg/m. Wt Readings from Last 3 Encounters:  03/28/16 296 lb (134.3 kg)  11/08/15 (!) 316 lb (143.3 kg)  10/12/15 (!) 315 lb 4.8 oz (143 kg)   Constitutional: obese, in NAD, large neck, pale Eyes: PERRLA, EOMI,  no exophthalmos ENT: moist mucous membranes, no thyromegaly, no cervical lymphadenopathy, Mallampati 3.5 Cardiovascular: Tachycardia, No MRG, +bilateral leg edema  Respiratory: CTA B Gastrointestinal: abdomen soft, NT, ND, BS+ Musculoskeletal: no deformities, strength intact in all 4 Skin: moist, warm, no rashes  ASSESSMENT: 1. DM2, insulin-dependent, uncontrolled, with complications and increased insulin resistance; on U500 - peripheral neuropathy  PLAN:  1. Patient with poor diabetes control, with prev. sugars much better after started Belviq - last HbA1c was great: 6.2%. Since last visit>> he came off  insulin and Invokana (this he just restarted 1 week ago) >> sugars are MUCH higher. Strongly advised him to restart insulin U500 (at 85$ per month is approx the same cost as N and R insulin with the high doses that he is taking). - HbA1c today: 9.9% (MUCH higher). - I advised him to: Patient Instructions  Please continue: - Metformin 1000 mg 2x a day - Invokana 300 mg daily in am  Restart: - U500 insulin: - 10 units before a very small meal - 16-18 units before a regular meal  - 26 units before a large meal  Please return in 3 months with your sugar log.   - he is UTD with eye exams - refused flu shot  - RTC in 3 mo with the sugar log  Billy Pavlovristina Shontez Sermon, MD PhD [facility]

## 2016-04-07 ENCOUNTER — Other Ambulatory Visit: Payer: Self-pay

## 2016-04-07 MED ORDER — INSULIN REGULAR HUMAN (CONC) 500 UNIT/ML ~~LOC~~ SOLN: SUBCUTANEOUS | 1 refills | Status: DC

## 2016-05-08 ENCOUNTER — Encounter: Payer: Self-pay | Admitting: Internal Medicine

## 2016-05-09 ENCOUNTER — Other Ambulatory Visit: Payer: Self-pay | Admitting: Internal Medicine

## 2016-05-09 MED ORDER — INSULIN NPH (HUMAN) (ISOPHANE) 100 UNIT/ML ~~LOC~~ SUSP: SUBCUTANEOUS | 11 refills | Status: DC

## 2016-05-09 MED ORDER — INSULIN REGULAR HUMAN 100 UNIT/ML IJ SOLN: 30.0000 [IU] | Freq: Three times a day (TID) | INTRAMUSCULAR | 11 refills | Status: DC

## 2016-05-12 ENCOUNTER — Other Ambulatory Visit: Payer: Self-pay | Admitting: Internal Medicine

## 2016-05-12 MED ORDER — INSULIN NPH (HUMAN) (ISOPHANE) 100 UNIT/ML ~~LOC~~ SUSP: SUBCUTANEOUS | 11 refills | Status: DC

## 2016-05-12 MED ORDER — INSULIN REGULAR HUMAN 100 UNIT/ML IJ SOLN: 30.0000 [IU] | Freq: Three times a day (TID) | INTRAMUSCULAR | 11 refills | Status: DC

## 2016-05-17 ENCOUNTER — Other Ambulatory Visit: Payer: Self-pay | Admitting: Internal Medicine

## 2016-06-25 ENCOUNTER — Other Ambulatory Visit: Payer: Self-pay | Admitting: Adult Health

## 2016-06-26 ENCOUNTER — Encounter: Payer: Self-pay | Admitting: Internal Medicine

## 2016-06-26 ENCOUNTER — Ambulatory Visit (INDEPENDENT_AMBULATORY_CARE_PROVIDER_SITE_OTHER): Payer: PPO | Admitting: Internal Medicine

## 2016-06-26 VITALS — BP 130/62 | HR 89 | Ht 68.0 in | Wt 313.0 lb

## 2016-06-26 DIAGNOSIS — Z794 Long term (current) use of insulin: Secondary | ICD-10-CM

## 2016-06-26 DIAGNOSIS — E1165 Type 2 diabetes mellitus with hyperglycemia: Secondary | ICD-10-CM | POA: Diagnosis not present

## 2016-06-26 LAB — POCT GLYCOSYLATED HEMOGLOBIN (HGB A1C): HEMOGLOBIN A1C: 8

## 2016-06-26 MED ORDER — INSULIN REGULAR HUMAN 100 UNIT/ML IJ SOLN: 30.0000 [IU] | Freq: Three times a day (TID) | INTRAMUSCULAR | 3 refills | Status: DC

## 2016-06-26 MED ORDER — INSULIN NPH (HUMAN) (ISOPHANE) 100 UNIT/ML ~~LOC~~ SUSP: SUBCUTANEOUS | 3 refills | Status: DC

## 2016-06-26 NOTE — Progress Notes (Signed)
Patient ID: Billy Hale, male   DOB: 1950-02-06, 66 y.o.   MRN: [id number]  HPI: Billy Hale is a 66 y.o.-year-old male, returning for DM2, dx 2004, insulin-dependent, uncontrolled, with complications (peripheral neuropathy). Last visit 3 mo ago.  He had to stop U500 b/c in doughnut hole >> we had to change to N and R.   Last hemoglobin A1c:  Lab Results  Component Value Date   HGBA1C 9.9 03/28/2016   HGBA1C 6.6 11/08/2015   HGBA1C 6.2 07/12/2015   He was on: - Metformin 1000 mg 2x a day - Invokana 300 mg daily in am: - U500 insulin - 2 meals a day:- 10 units before a very small meal - 16-18 units before a regular meal  - 26 units before a large meal Now on: Insulin Before breakfast  (usually skips) Before lunch (usually skips) Before dinner  Regular 30-60 (40) 30-60 (40) 30-60  NPH 40 40 40  Metformin 1000 mg 2x a day  Pt checks his sugars 2x a day >> slightly better: - am:  110-149, 170, 216 >> 120-158, 170 >> 163, 205-355 >> 198-248 - 2h after b'fast:  n/c >> 112-153 (238 x1) >> n/c >> 182 >> n/c >> 187, 204 - before lunch:  134-171 >> 107-148 >> 129-148 >>> 196-417 >> 142-224, 273 - 2h after lunch:  135-199 >> n/c >> 119-177 >> 119--177 >> 239 >> 188, 209 - dinner: 118-234 >> 71-153 >> 118-178 >> 133, 234-308 >> 115-210, 362 - bedtime: 88, 157, 198 >> n/c >> 40x1, 81-185 >> 159, 185 >> n/c - nighttime: 225, 250 >> 178-221 >> 117, 142 >> 74 >> 199 >> 196-233 >> 199-227  he has hypoglycemia awareness at 70. His highest sugar was 400s >> 203 >> 200s >> 417 >> 362.  Pt does not have chronic kidney disease, last BUN/creatinine was:  Lab Results  Component Value Date   BUN 18 04/20/2015   CREATININE 0.70 04/20/2015  He is on Valsartan Last set of lipids: Lab Results  Component Value Date   CHOL 123 04/20/2015   HDL 36.90 (L) 04/20/2015   LDLCALC 60 04/20/2015   LDLDIRECT 72.9 11/01/2010   TRIG 135.0 04/20/2015   CHOLHDL 3 04/20/2015  He is on Simvastatin. Pt's last  eye exam was 09/2015 >> No DR reportedly; had cataracts.  No numbness and tingling in his legs.   He also has a history of mild transaminitis, HTN- on Diovan HCT, Amlodipine, HL - on statin, obesity, depression, hypogonadism, OSA - compliant with CPAP.   ROS: Constitutional: + weight gain, no fatigue, no subjective hyperthermia, no subjective hypothermia Eyes: no blurry vision, no xerophthalmia ENT: no sore throat, no nodules palpated in throat, no dysphagia, no odynophagia, no hoarseness Cardiovascular: no CP/no SOB/no palpitations/no leg swelling Respiratory: no cough/no SOB/no wheezing Gastrointestinal: no N/no V/no D/no C/no acid reflux Musculoskeletal: no muscle aches/no joint aches Skin: no rashes, no hair loss Neurological: no tremors/no numbness/no tingling/no dizziness  I reviewed pt's medications, allergies, PMH, social hx, family hx, and changes were documented in the history of present illness. Otherwise, unchanged from my initial visit note.  PE: BP 130/62 (BP Location: Left Arm, Patient Position: Sitting)   Pulse 89   Ht 5\' 8"  (1.727 m)   Wt (!) 313 lb (142 kg)   SpO2 97%   BMI 47.59 kg/m   Body mass index is 47.59 kg/m. Wt Readings from Last 3 Encounters:  06/26/16 (!) 313 lb (142 kg)  03/28/16 296  lb (134.3 kg)  11/08/15 (!) 316 lb (143.3 kg)   Constitutional: obese, in NAD Eyes: PERRLA, EOMI, no exophthalmos ENT: moist mucous membranes, no thyromegaly, no cervical lymphadenopathy Cardiovascular: RRR, No MRG Respiratory: CTA B Gastrointestinal: abdomen soft, NT, ND, BS+ Musculoskeletal: no deformities, strength intact in all 4 Skin: moist, warm, no rashes Neurological: no tremor with outstretched hands, DTR normal in all 4   ASSESSMENT: 1. DM2, insulin-dependent, uncontrolled, with complications and increased insulin resistance; on U500 - peripheral neuropathy  PLAN:  1. Patient with poor diabetes control, Now off U500 insulin while in the doughnut hole  - we had to change to N and R insulins  And his sugars actually a little better this visit. They are still high though. He is also unhappy that he continues to gain weight despite eating smaller portions.  - He is telling me that he doesn't usually eat breakfast or lunch with the exceptions. He is keeping all of the insulins with these meals. - His sugars are higher in the morning so I advised him to increase his NPH at night. However, I advised him to take his NPH insulin in the morning even if he is not eating breakfast. Since he is NPO  until dinnertime, I will ask him to take a lower dose of NPH in the days that he is not eating  breakfast or lunch. - I will give him individual  insulin protocols for the above situations >> see pt instructions - I advised him to: Patient Instructions   Please change the insulin doses as follows:  If you eat all meals: Insulin Before breakfast Before lunch Before dinner  Regular 30-60 (40) 30-60 (40) 30-60  NPH 40  40 40 >> 50 (may even need to increase to 60 if sugars in am are still high)  Metformin 1000 mg 2x a day  If you skip b'fast: Insulin Before breakfast Before lunch Before dinner  Regular x 30-60 (40) 30-60  NPH 25 40 40 >> 50 (may even need to increase to 60 if sugars in am are still high)  Metformin 1000 mg 2x a day   If you skip lunch: Insulin Before breakfast Before lunch Before dinner  Regular 30-60 (40) x 30-60  NPH 40 x 40 >> 50 (may even need to increase to 60 if sugars in am are still high)  Metformin 1000 mg 2x a day  If you skip b'fast and lunch: Insulin Before breakfast Before lunch Before dinner  Regular x x 30-60  NPH 25 x 40 >> 50 (may even need to increase to 60 if sugars in am are still high)  Metformin 1000 mg 2x a day  Please return in 3 months with your sugar log.   - today, HbA1c is 8.0% (better!) - continue checking sugars at different times of the day - check 3x a day, rotating checks - advised for yearly eye  exams >> he is UTD - refilled his N and R Rxes - Return to clinic in 3 mo with sugar log   Carlus Pavlovristina Terianne Thaker, MD PhD [facility]

## 2016-06-26 NOTE — Addendum Note (Signed)
Addended by: Darene LamerHOMPSON, Caeden Foots T on: 06/26/2016 11:11 AM   Modules accepted: Orders

## 2016-06-26 NOTE — Patient Instructions (Addendum)
Please change the insulin doses as follows:  If you eat all meals: Insulin Before breakfast Before lunch Before dinner  Regular 30-60 (40) 30-60 (40) 30-60  NPH 40  40 40 >> 50 (may even need to increase to 60 if sugars in am are still high)  Metformin 1000 mg 2x a day  If you skip b'fast: Insulin Before breakfast Before lunch Before dinner  Regular x 30-60 (40) 30-60  NPH 25 40 40 >> 50 (may even need to increase to 60 if sugars in am are still high)  Metformin 1000 mg 2x a day   If you skip lunch: Insulin Before breakfast Before lunch Before dinner  Regular 30-60 (40) x 30-60  NPH 40 x 40 >> 50 (may even need to increase to 60 if sugars in am are still high)  Metformin 1000 mg 2x a day  If you skip b'fast and lunch: Insulin Before breakfast Before lunch Before dinner  Regular x x 30-60  NPH 25 x 40 >> 50 (may even need to increase to 60 if sugars in am are still high)  Metformin 1000 mg 2x a day  Please return in 3 months with your sugar log.

## 2016-07-01 ENCOUNTER — Other Ambulatory Visit: Payer: Self-pay | Admitting: Adult Health

## 2016-08-27 ENCOUNTER — Other Ambulatory Visit: Payer: Self-pay | Admitting: Adult Health

## 2016-08-27 NOTE — Telephone Encounter (Signed)
Pt coming in on Friday

## 2016-08-29 ENCOUNTER — Ambulatory Visit: Payer: PPO | Admitting: Adult Health

## 2016-08-29 NOTE — Telephone Encounter (Signed)
Ok to refill for 6 months 

## 2016-08-29 NOTE — Telephone Encounter (Signed)
Pt did not show for yearly this morning.

## 2016-08-29 NOTE — Telephone Encounter (Signed)
Sent to the pharmacy by e-scribe. 
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
## 2016-10-01 ENCOUNTER — Ambulatory Visit: Payer: PPO | Admitting: Internal Medicine

## 2016-10-10 ENCOUNTER — Encounter: Payer: Self-pay | Admitting: Adult Health

## 2016-11-24 ENCOUNTER — Other Ambulatory Visit: Payer: Self-pay | Admitting: Internal Medicine

## 2016-12-19 ENCOUNTER — Other Ambulatory Visit: Payer: Self-pay | Admitting: Adult Health

## 2016-12-19 NOTE — Telephone Encounter (Signed)
Needs CPE 

## 2016-12-19 NOTE — Telephone Encounter (Signed)
MEDICATION DENIED.  NEEDS A PHYSICAL AND FASTING LAB WORK FOR FURTHER REFILLS OF THIS MEDICATION.  MESSAGE SENT TO THE PHARMACY.

## 2017-01-07 ENCOUNTER — Other Ambulatory Visit: Payer: Self-pay | Admitting: Adult Health

## 2017-01-08 NOTE — Telephone Encounter (Signed)
Denied.  Pt needs a cpx with fasting lab work for further refills of this medication.  Message sent to the pharmacy.

## 2017-01-09 ENCOUNTER — Other Ambulatory Visit: Payer: Self-pay | Admitting: Adult Health

## 2017-01-15 NOTE — Telephone Encounter (Signed)
DENIED.  PT NEEDS TO COME IN FOR CPX WITH FASTING LAB WORK WITH [facility].

## 2017-01-19 ENCOUNTER — Telehealth: Payer: Self-pay | Admitting: Adult Health

## 2017-01-19 MED ORDER — AMLODIPINE BESYLATE 10 MG PO TABS: 10.0000 mg | ORAL_TABLET | Freq: Every day | ORAL | 0 refills | Status: DC

## 2017-01-19 MED ORDER — VALSARTAN-HYDROCHLOROTHIAZIDE 160-25 MG PO TABS: 1.0000 | ORAL_TABLET | Freq: Every day | ORAL | 0 refills | Status: DC

## 2017-01-19 NOTE — Telephone Encounter (Signed)
Last refill on both meds 07/01/16 each 90# with 1 refill. No OV past 12 months, has upcoming CPE 01/23/17 refilled x 1 month each (Valsartan and amlodipine)

## 2017-01-19 NOTE — Telephone Encounter (Signed)
Copied from CRM [phone]. Topic: Quick Communication - See Telephone Encounter >> Jan 19, 2017  2:22 PM Diana EvesHoyt, Maryann B wrote: CRM for notification. See Telephone encounter for:  Pt needing refill on valsartan and amlodipine pt has cpe scheduled for 01/23/17 01/19/17.

## 2017-01-23 ENCOUNTER — Ambulatory Visit (INDEPENDENT_AMBULATORY_CARE_PROVIDER_SITE_OTHER): Payer: PPO | Admitting: Adult Health

## 2017-01-23 ENCOUNTER — Encounter: Payer: Self-pay | Admitting: Adult Health

## 2017-01-23 VITALS — BP 140/60 | HR 84 | Temp 98.5°F | Ht 70.0 in | Wt 318.8 lb

## 2017-01-23 DIAGNOSIS — Z125 Encounter for screening for malignant neoplasm of prostate: Secondary | ICD-10-CM | POA: Diagnosis not present

## 2017-01-23 DIAGNOSIS — I10 Essential (primary) hypertension: Secondary | ICD-10-CM

## 2017-01-23 DIAGNOSIS — Z0000 Encounter for general adult medical examination without abnormal findings: Secondary | ICD-10-CM

## 2017-01-23 DIAGNOSIS — F339 Major depressive disorder, recurrent, unspecified: Secondary | ICD-10-CM | POA: Diagnosis not present

## 2017-01-23 DIAGNOSIS — E782 Mixed hyperlipidemia: Secondary | ICD-10-CM | POA: Diagnosis not present

## 2017-01-23 DIAGNOSIS — Z794 Long term (current) use of insulin: Secondary | ICD-10-CM | POA: Diagnosis not present

## 2017-01-23 DIAGNOSIS — E1165 Type 2 diabetes mellitus with hyperglycemia: Secondary | ICD-10-CM | POA: Diagnosis not present

## 2017-01-23 LAB — CBC WITH DIFFERENTIAL/PLATELET
Basophils Absolute: 0 10*3/uL (ref 0.0–0.1)
Basophils Relative: 0.6 % (ref 0.0–3.0)
EOS ABS: 0.4 10*3/uL (ref 0.0–0.7)
EOS PCT: 5.4 % — AB (ref 0.0–5.0)
HEMATOCRIT: 42.3 % (ref 39.0–52.0)
HEMOGLOBIN: 14.3 g/dL (ref 13.0–17.0)
LYMPHS PCT: 24.8 % (ref 12.0–46.0)
Lymphs Abs: 1.7 10*3/uL (ref 0.7–4.0)
MCHC: 33.7 g/dL (ref 30.0–36.0)
MCV: 98.8 fl (ref 78.0–100.0)
MONO ABS: 0.5 10*3/uL (ref 0.1–1.0)
Monocytes Relative: 7.1 % (ref 3.0–12.0)
Neutro Abs: 4.2 10*3/uL (ref 1.4–7.7)
Neutrophils Relative %: 62.1 % (ref 43.0–77.0)
Platelets: 113 10*3/uL — ABNORMAL LOW (ref 150.0–400.0)
RBC: 4.28 Mil/uL (ref 4.22–5.81)
RDW: 14.1 % (ref 11.5–15.5)
WBC: 6.8 10*3/uL (ref 4.0–10.5)

## 2017-01-23 LAB — LIPID PANEL
CHOLESTEROL: 100 mg/dL (ref 0–200)
HDL: 37.8 mg/dL — ABNORMAL LOW (ref >39.00)
LDL Cholesterol: 39 mg/dL (ref 0–99)
NonHDL: 62.63
TRIGLYCERIDES: 116 mg/dL (ref 0.0–149.0)
Total CHOL/HDL Ratio: 3
VLDL: 23.2 mg/dL (ref 0.0–40.0)

## 2017-01-23 LAB — HEPATIC FUNCTION PANEL
ALT: 26 U/L (ref 0–53)
AST: 22 U/L (ref 0–37)
Albumin: 4 g/dL (ref 3.5–5.2)
Alkaline Phosphatase: 101 U/L (ref 39–117)
BILIRUBIN DIRECT: 0.1 mg/dL (ref 0.0–0.3)
TOTAL PROTEIN: 7.3 g/dL (ref 6.0–8.3)
Total Bilirubin: 0.6 mg/dL (ref 0.2–1.2)

## 2017-01-23 LAB — BASIC METABOLIC PANEL
BUN: 15 mg/dL (ref 6–23)
CO2: 28 mEq/L (ref 19–32)
Calcium: 9.1 mg/dL (ref 8.4–10.5)
Chloride: 103 mEq/L (ref 96–112)
Creatinine, Ser: 0.76 mg/dL (ref 0.40–1.50)
GFR: 108.65 mL/min (ref >60.00)
Glucose, Bld: 162 mg/dL — ABNORMAL HIGH (ref 70–99)
Potassium: 4.2 mEq/L (ref 3.5–5.1)
Sodium: 140 mEq/L (ref 135–145)

## 2017-01-23 LAB — TSH: TSH: 4.53 u[IU]/mL — AB (ref 0.35–4.50)

## 2017-01-23 LAB — PSA: PSA: 1.08 ng/mL (ref 0.10–4.00)

## 2017-01-23 LAB — HEMOGLOBIN A1C: HEMOGLOBIN A1C: 8.6 % — AB (ref 4.6–6.5)
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
## 2017-01-23 NOTE — Patient Instructions (Signed)
It was great seeing you today   I will follow up with you regarding your lab work   Please let me know if you need anything   Work on weight loss through diet and exercise

## 2017-03-05 ENCOUNTER — Other Ambulatory Visit: Payer: Self-pay | Admitting: Adult Health

## 2017-03-06 MED ORDER — VALSARTAN-HYDROCHLOROTHIAZIDE 160-25 MG PO TABS: 1.0000 | ORAL_TABLET | Freq: Every day | ORAL | 3 refills | Status: DC

## 2017-03-06 MED ORDER — AMLODIPINE BESYLATE 10 MG PO TABS: 10.0000 mg | ORAL_TABLET | Freq: Every day | ORAL | 3 refills | Status: DC

## 2017-03-06 NOTE — Telephone Encounter (Signed)
Sent to the pharmacy by e-scribe. 

## 2017-03-12 ENCOUNTER — Other Ambulatory Visit: Payer: Self-pay | Admitting: Internal Medicine

## 2017-03-26 ENCOUNTER — Other Ambulatory Visit: Payer: Self-pay | Admitting: Internal Medicine

## 2017-03-27 ENCOUNTER — Encounter: Payer: Self-pay | Admitting: Internal Medicine

## 2017-04-21 ENCOUNTER — Other Ambulatory Visit: Payer: Self-pay | Admitting: Internal Medicine

## 2017-04-23 ENCOUNTER — Encounter: Payer: Self-pay | Admitting: Internal Medicine

## 2017-04-24 ENCOUNTER — Other Ambulatory Visit: Payer: Self-pay

## 2017-04-24 MED ORDER — "INSULIN SYRINGE-NEEDLE U-100 31G X 5/16"" 1 ML MISC": 0 refills | Status: DC

## 2017-06-10 ENCOUNTER — Other Ambulatory Visit: Payer: Self-pay | Admitting: Internal Medicine

## 2017-06-10 DIAGNOSIS — E119 Type 2 diabetes mellitus without complications: Secondary | ICD-10-CM | POA: Diagnosis not present

## 2017-06-10 LAB — HM DIABETES EYE EXAM

## 2017-06-12 ENCOUNTER — Other Ambulatory Visit: Payer: Self-pay | Admitting: Internal Medicine

## 2017-06-24 ENCOUNTER — Other Ambulatory Visit: Payer: Self-pay | Admitting: Adult Health

## 2017-06-24 NOTE — Telephone Encounter (Signed)
Sent to the pharmacy by e-scribe. 

## 2017-07-22 ENCOUNTER — Other Ambulatory Visit: Payer: Self-pay | Admitting: Internal Medicine

## 2017-07-22 NOTE — Telephone Encounter (Signed)
OK, but need an appt within next 3 mo

## 2017-07-22 NOTE — Telephone Encounter (Signed)
Is this okay to refill? 

## 2017-07-31 ENCOUNTER — Other Ambulatory Visit: Payer: Self-pay

## 2017-07-31 MED ORDER — "INSULIN SYRINGE-NEEDLE U-100 31G X 5/16"" 1 ML MISC": 0 refills | Status: DC

## 2017-08-23 ENCOUNTER — Other Ambulatory Visit: Payer: Self-pay | Admitting: Internal Medicine

## 2017-09-02 ENCOUNTER — Other Ambulatory Visit: Payer: Self-pay

## 2017-09-02 MED ORDER — METFORMIN HCL 1000 MG PO TABS: ORAL_TABLET | ORAL | 0 refills | Status: DC

## 2017-09-26 ENCOUNTER — Other Ambulatory Visit: Payer: Self-pay | Admitting: Internal Medicine

## 2017-09-28 MED ORDER — METFORMIN HCL 1000 MG PO TABS: ORAL_TABLET | ORAL | 1 refills | Status: DC

## 2017-09-28 NOTE — Addendum Note (Signed)
Addended by: Yolande Jolly on: 09/28/2017 11:53 AM   Modules accepted: Orders

## 2017-10-01 ENCOUNTER — Ambulatory Visit (INDEPENDENT_AMBULATORY_CARE_PROVIDER_SITE_OTHER): Payer: PPO | Admitting: Internal Medicine

## 2017-10-01 ENCOUNTER — Encounter: Payer: Self-pay | Admitting: Internal Medicine

## 2017-10-01 VITALS — BP 132/60 | HR 102 | Ht 70.0 in | Wt 315.8 lb

## 2017-10-01 DIAGNOSIS — Z794 Long term (current) use of insulin: Secondary | ICD-10-CM | POA: Diagnosis not present

## 2017-10-01 DIAGNOSIS — E782 Mixed hyperlipidemia: Secondary | ICD-10-CM

## 2017-10-01 DIAGNOSIS — R7989 Other specified abnormal findings of blood chemistry: Secondary | ICD-10-CM | POA: Diagnosis not present

## 2017-10-01 DIAGNOSIS — E1165 Type 2 diabetes mellitus with hyperglycemia: Secondary | ICD-10-CM

## 2017-10-01 LAB — POCT GLYCOSYLATED HEMOGLOBIN (HGB A1C): HEMOGLOBIN A1C: 8.6 % — AB (ref 4.0–5.6)

## 2017-10-01 LAB — T4, FREE: Free T4: 0.6 ng/dL (ref 0.60–1.60)

## 2017-10-01 LAB — T3, FREE: T3 FREE: 3.2 pg/mL (ref 2.3–4.2)

## 2017-10-01 LAB — TSH: TSH: 4.97 u[IU]/mL — AB (ref 0.35–4.50)

## 2017-10-01 NOTE — Patient Instructions (Addendum)
Please continue: Insulin Before breakfast Before lunch Before dinner  Regular (clear) 40-60 40-60 40-60  NPH (cloudy) 40  40 40   Metformin 1000 mg 2x a day.  You always need to take the N insulin, even if you skip a meal. You can skip the R insulin if you skip a meal.  Please return in 3 months with your sugar log.
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
## 2017-11-19 ENCOUNTER — Other Ambulatory Visit: Payer: Self-pay | Admitting: Internal Medicine

## 2017-12-07 ENCOUNTER — Other Ambulatory Visit: Payer: Self-pay | Admitting: Pharmacist

## 2017-12-07 NOTE — Patient Outreach (Addendum)
[facility]  12/07/2017  Billy Hale Hale 04/08/1950 [id number]   Outreach call to Billy Hale regarding his [facility] for follow up [facility]. Patient contacted regarding adherence to simvastatin. Left a HIPAA compliant message on with woman answering patient's phone.  Duanne MoronElisabeth Traci Plemons, PharmD, [facility] CenterBCACP Clinical Pharmacist [facility] Care Management [phone]

## 2017-12-09 ENCOUNTER — Other Ambulatory Visit: Payer: Self-pay

## 2017-12-09 MED ORDER — "INSULIN SYRINGE-NEEDLE U-100 31G X 5/16"" 1 ML MISC": 0 refills | Status: DC

## 2018-01-06 ENCOUNTER — Encounter: Payer: Self-pay | Admitting: Internal Medicine

## 2018-01-06 ENCOUNTER — Ambulatory Visit: Payer: PPO | Admitting: Internal Medicine

## 2018-01-06 VITALS — BP 128/70 | HR 85 | Ht 70.0 in | Wt 317.0 lb

## 2018-01-06 DIAGNOSIS — E1165 Type 2 diabetes mellitus with hyperglycemia: Secondary | ICD-10-CM

## 2018-01-06 DIAGNOSIS — E669 Obesity, unspecified: Secondary | ICD-10-CM

## 2018-01-06 DIAGNOSIS — E038 Other specified hypothyroidism: Secondary | ICD-10-CM

## 2018-01-06 DIAGNOSIS — Z794 Long term (current) use of insulin: Secondary | ICD-10-CM | POA: Diagnosis not present

## 2018-01-06 DIAGNOSIS — E039 Hypothyroidism, unspecified: Secondary | ICD-10-CM

## 2018-01-06 LAB — LIPID PANEL
CHOLESTEROL: 124 mg/dL (ref 0–200)
HDL: 40.7 mg/dL (ref >39.00)
LDL Cholesterol: 53 mg/dL (ref 0–99)
NonHDL: 83.27
TRIGLYCERIDES: 149 mg/dL (ref 0.0–149.0)
Total CHOL/HDL Ratio: 3
VLDL: 29.8 mg/dL (ref 0.0–40.0)

## 2018-01-06 LAB — MICROALBUMIN / CREATININE URINE RATIO
CREATININE, U: 126.6 mg/dL
MICROALB UR: 61.9 mg/dL — AB (ref 0.0–1.9)
Microalb Creat Ratio: 48.9 mg/g — ABNORMAL HIGH (ref 0.0–30.0)

## 2018-01-06 LAB — T4, FREE: FREE T4: 0.56 ng/dL — AB (ref 0.60–1.60)

## 2018-01-06 LAB — POCT GLYCOSYLATED HEMOGLOBIN (HGB A1C): Hemoglobin A1C: 8.1 % — AB (ref 4.0–5.6)

## 2018-01-06 LAB — T3, FREE: T3 FREE: 2.8 pg/mL (ref 2.3–4.2)

## 2018-01-06 LAB — TSH: TSH: 3.65 u[IU]/mL (ref 0.35–4.50)

## 2018-01-06 MED ORDER — METFORMIN HCL 1000 MG PO TABS: ORAL_TABLET | ORAL | 3 refills | Status: DC

## 2018-01-06 NOTE — Addendum Note (Signed)
Addended by: Darliss RidgelONGER, Everlene Cunning I on: 01/06/2018 10:08 AM   Modules accepted: Orders

## 2018-01-06 NOTE — Patient Instructions (Addendum)
Please change: Insulin Before breakfast Before lunch Before dinner  Regular 60 60 60  NPH 40  40 40 >> 45 >> 50   Please continue: - Metformin 1000 mg 2x a day  Check some sugars at bedtime or after dinner.  Please stop at the lab.  Please return in 3 months with your sugar log.
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
## 2018-01-07 LAB — COMPLETE METABOLIC PANEL WITH GFR
AG Ratio: 1.2 (calc) (ref 1.0–2.5)
ALBUMIN MSPROF: 4 g/dL (ref 3.6–5.1)
ALT: 30 U/L (ref 9–46)
AST: 29 U/L (ref 10–35)
Alkaline phosphatase (APISO): 114 U/L (ref 40–115)
BILIRUBIN TOTAL: 0.6 mg/dL (ref 0.2–1.2)
BUN: 12 mg/dL (ref 7–25)
CHLORIDE: 101 mmol/L (ref 98–110)
CO2: 23 mmol/L (ref 20–32)
Calcium: 9.5 mg/dL (ref 8.6–10.3)
Creat: 0.84 mg/dL (ref 0.70–1.25)
GFR, Est African American: 104 mL/min/{1.73_m2} (ref >=60)
GFR, Est Non African American: 90 mL/min/{1.73_m2} (ref >=60)
GLUCOSE: 245 mg/dL — AB (ref 65–99)
Globulin: 3.4 g/dL (calc) (ref 1.9–3.7)
Potassium: 4.2 mmol/L (ref 3.5–5.3)
Sodium: 136 mmol/L (ref 135–146)
Total Protein: 7.4 g/dL (ref 6.1–8.1)

## 2018-02-07 ENCOUNTER — Encounter: Payer: Self-pay | Admitting: Internal Medicine

## 2018-02-08 MED ORDER — "INSULIN SYRINGE-NEEDLE U-100 31G X 5/16"" 1 ML MISC": 2 refills | Status: DC

## 2018-02-08 MED ORDER — GLUCOSE BLOOD VI STRP: ORAL_STRIP | 3 refills | Status: DC

## 2018-02-26 ENCOUNTER — Other Ambulatory Visit: Payer: Self-pay | Admitting: Internal Medicine

## 2018-03-26 ENCOUNTER — Other Ambulatory Visit: Payer: Self-pay | Admitting: Adult Health

## 2018-03-30 ENCOUNTER — Encounter: Payer: Self-pay | Admitting: Family Medicine

## 2018-03-30 NOTE — Telephone Encounter (Signed)
Sent to the pharmacy by e-scribe for 30 days.  Past due for cpx.  Message sent to pt on Mychart and pt has seen.  Nothing further needed.

## 2018-04-16 ENCOUNTER — Ambulatory Visit: Payer: PPO | Admitting: Internal Medicine
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
## 2018-04-18 NOTE — Patient Instructions (Addendum)
  Please continue: Insulin Before breakfast Before lunch Before dinner  Regular 60 60 60  NPH 40-45 40-45 50   Please continue: - Metformin 1000 mg twice a day  STOP MILK!  No snacking between meals.  Look up: Dr. Wylene Simmer - The Obesity Code  Start intermittent fasting: - 6 hours eating window, 18 hours fasting >> start with 2 days a week  Then try: - 24 h fasting  Then try: - 36 h fasting   Do not take any diabetic medicines while you fast, however, if the sugars are still high, you may take 1/2 of the NPH dose.  Please return in 3-4 months with your sugar log.

## 2018-04-19 ENCOUNTER — Ambulatory Visit (INDEPENDENT_AMBULATORY_CARE_PROVIDER_SITE_OTHER): Payer: PPO | Admitting: Internal Medicine

## 2018-04-19 ENCOUNTER — Encounter: Payer: Self-pay | Admitting: Internal Medicine

## 2018-04-19 ENCOUNTER — Other Ambulatory Visit: Payer: Self-pay

## 2018-04-19 VITALS — BP 150/70 | HR 82 | Temp 98.9°F | Ht 70.0 in | Wt 322.0 lb

## 2018-04-19 DIAGNOSIS — E782 Mixed hyperlipidemia: Secondary | ICD-10-CM

## 2018-04-19 DIAGNOSIS — E1165 Type 2 diabetes mellitus with hyperglycemia: Secondary | ICD-10-CM | POA: Diagnosis not present

## 2018-04-19 DIAGNOSIS — Z794 Long term (current) use of insulin: Secondary | ICD-10-CM

## 2018-04-19 DIAGNOSIS — E669 Obesity, unspecified: Secondary | ICD-10-CM

## 2018-04-19 DIAGNOSIS — R7989 Other specified abnormal findings of blood chemistry: Secondary | ICD-10-CM

## 2018-04-19 LAB — POCT GLYCOSYLATED HEMOGLOBIN (HGB A1C): Hemoglobin A1C: 7.6 % — AB (ref 4.0–5.6)

## 2018-04-30 ENCOUNTER — Other Ambulatory Visit: Payer: Self-pay | Admitting: Adult Health

## 2018-05-02 ENCOUNTER — Other Ambulatory Visit: Payer: Self-pay | Admitting: Adult Health

## 2018-05-03 NOTE — Telephone Encounter (Signed)
Filled for 90 days.  Pt had last lipid from Dr. Elvera Lennox 01/06/18.

## 2018-05-03 NOTE — Telephone Encounter (Signed)
Last seen 01/2017.  Would you like Doxy?

## 2018-05-04 NOTE — Telephone Encounter (Signed)
Yes please

## 2018-05-04 NOTE — Telephone Encounter (Signed)
Please set up Doxy appointment for BP and depression meds

## 2018-05-04 NOTE — Telephone Encounter (Signed)
Left a message for a return call on home and cell. 

## 2018-05-05 ENCOUNTER — Encounter: Payer: Self-pay | Admitting: Internal Medicine

## 2018-05-05 ENCOUNTER — Encounter: Payer: Self-pay | Admitting: Adult Health

## 2018-05-05 ENCOUNTER — Ambulatory Visit (INDEPENDENT_AMBULATORY_CARE_PROVIDER_SITE_OTHER): Payer: PPO | Admitting: Adult Health

## 2018-05-05 ENCOUNTER — Other Ambulatory Visit: Payer: Self-pay

## 2018-05-05 DIAGNOSIS — F339 Major depressive disorder, recurrent, unspecified: Secondary | ICD-10-CM | POA: Diagnosis not present

## 2018-05-05 DIAGNOSIS — I10 Essential (primary) hypertension: Secondary | ICD-10-CM | POA: Diagnosis not present

## 2018-05-05 MED ORDER — VALSARTAN-HYDROCHLOROTHIAZIDE 160-25 MG PO TABS: 1.0000 | ORAL_TABLET | Freq: Every day | ORAL | 0 refills | Status: DC

## 2018-05-05 MED ORDER — SERTRALINE HCL 100 MG PO TABS: 100.0000 mg | ORAL_TABLET | Freq: Every day | ORAL | 0 refills | Status: DC

## 2018-05-05 MED ORDER — AMLODIPINE BESYLATE 10 MG PO TABS: 10.0000 mg | ORAL_TABLET | Freq: Every day | ORAL | 0 refills | Status: DC
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
## 2018-05-17 ENCOUNTER — Encounter: Payer: Self-pay | Admitting: Internal Medicine

## 2018-05-19 NOTE — Telephone Encounter (Signed)
Please review and advise.

## 2018-05-31 ENCOUNTER — Other Ambulatory Visit: Payer: Self-pay | Admitting: Internal Medicine

## 2018-06-07 ENCOUNTER — Encounter: Payer: Self-pay | Admitting: Internal Medicine

## 2018-06-22 ENCOUNTER — Encounter: Payer: Self-pay | Admitting: Internal Medicine

## 2018-07-07 ENCOUNTER — Encounter: Payer: Self-pay | Admitting: Internal Medicine

## 2018-07-21 ENCOUNTER — Other Ambulatory Visit: Payer: Self-pay | Admitting: Adult Health

## 2018-07-21 ENCOUNTER — Encounter: Payer: Self-pay | Admitting: Internal Medicine

## 2018-07-21 ENCOUNTER — Encounter: Payer: Self-pay | Admitting: Adult Health

## 2018-07-21 DIAGNOSIS — F339 Major depressive disorder, recurrent, unspecified: Secondary | ICD-10-CM

## 2018-07-21 DIAGNOSIS — I10 Essential (primary) hypertension: Secondary | ICD-10-CM

## 2018-07-22 MED ORDER — VALSARTAN-HYDROCHLOROTHIAZIDE 160-25 MG PO TABS: 1.0000 | ORAL_TABLET | Freq: Every day | ORAL | 0 refills | Status: DC

## 2018-07-22 MED ORDER — AMLODIPINE BESYLATE 10 MG PO TABS: 10.0000 mg | ORAL_TABLET | Freq: Every day | ORAL | 0 refills | Status: DC

## 2018-07-22 MED ORDER — SIMVASTATIN 20 MG PO TABS: 20.0000 mg | ORAL_TABLET | Freq: Every day | ORAL | 0 refills | Status: DC

## 2018-07-22 MED ORDER — SERTRALINE HCL 100 MG PO TABS: 100.0000 mg | ORAL_TABLET | Freq: Every day | ORAL | 0 refills | Status: DC

## 2018-07-22 NOTE — Telephone Encounter (Signed)
Sent to the pharmacy by e-scribe. 

## 2018-07-22 NOTE — Telephone Encounter (Signed)
Ok to refill for 90 days  

## 2018-08-03 ENCOUNTER — Encounter: Payer: Self-pay | Admitting: Internal Medicine

## 2018-08-10 ENCOUNTER — Other Ambulatory Visit: Payer: Self-pay

## 2018-08-10 ENCOUNTER — Encounter: Payer: Self-pay | Admitting: Internal Medicine

## 2018-08-10 ENCOUNTER — Ambulatory Visit: Payer: PPO | Admitting: Internal Medicine

## 2018-08-10 VITALS — BP 120/60 | HR 86 | Ht 70.0 in | Wt 321.0 lb

## 2018-08-10 DIAGNOSIS — Z794 Long term (current) use of insulin: Secondary | ICD-10-CM

## 2018-08-10 DIAGNOSIS — E782 Mixed hyperlipidemia: Secondary | ICD-10-CM

## 2018-08-10 DIAGNOSIS — R7989 Other specified abnormal findings of blood chemistry: Secondary | ICD-10-CM

## 2018-08-10 DIAGNOSIS — E669 Obesity, unspecified: Secondary | ICD-10-CM | POA: Diagnosis not present

## 2018-08-10 DIAGNOSIS — E1165 Type 2 diabetes mellitus with hyperglycemia: Secondary | ICD-10-CM | POA: Diagnosis not present

## 2018-08-10 LAB — POCT GLYCOSYLATED HEMOGLOBIN (HGB A1C): Hemoglobin A1C: 6.4 % — AB (ref 4.0–5.6)

## 2018-08-10 NOTE — Addendum Note (Signed)
Addended by: Cardell Peach I on: 08/10/2018 10:51 AM   Modules accepted: Orders

## 2018-08-10 NOTE — Patient Instructions (Addendum)
Please continue:  Insulin Before breakfast Before lunch Before dinner  Regular 35-40 35-40 35-40  NPH 40-50 40-50 40-50   - Metformin 1000 mg twice a day  Please return in 3-4 months with your sugar log.
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
## 2018-08-13 ENCOUNTER — Other Ambulatory Visit: Payer: Self-pay | Admitting: Internal Medicine

## 2018-08-15 ENCOUNTER — Encounter: Payer: Self-pay | Admitting: Adult Health

## 2018-08-16 ENCOUNTER — Encounter: Payer: Self-pay | Admitting: Internal Medicine

## 2018-08-17 ENCOUNTER — Telehealth (INDEPENDENT_AMBULATORY_CARE_PROVIDER_SITE_OTHER): Payer: PPO | Admitting: Adult Health

## 2018-08-17 ENCOUNTER — Other Ambulatory Visit: Payer: Self-pay

## 2018-08-17 DIAGNOSIS — Z20828 Contact with and (suspected) exposure to other viral communicable diseases: Secondary | ICD-10-CM | POA: Diagnosis not present

## 2018-08-17 DIAGNOSIS — Z20822 Contact with and (suspected) exposure to covid-19: Secondary | ICD-10-CM

## 2018-08-17 NOTE — Telephone Encounter (Signed)
Pt scheduled for virtual visit with Tommi Rumps today at 4:30 PM.  Nothing further needed.
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
## 2018-10-25 ENCOUNTER — Encounter: Payer: Self-pay | Admitting: Internal Medicine

## 2018-10-26 ENCOUNTER — Other Ambulatory Visit: Payer: Self-pay | Admitting: Adult Health

## 2018-10-26 DIAGNOSIS — I10 Essential (primary) hypertension: Secondary | ICD-10-CM

## 2018-10-26 DIAGNOSIS — F339 Major depressive disorder, recurrent, unspecified: Secondary | ICD-10-CM

## 2018-10-27 NOTE — Telephone Encounter (Signed)
DENIED.  PAST DUE FOR CPX AND FASTING LAB WORK. 

## 2018-11-01 ENCOUNTER — Encounter: Payer: Self-pay | Admitting: Adult Health

## 2018-11-04 ENCOUNTER — Other Ambulatory Visit: Payer: Self-pay | Admitting: Adult Health

## 2018-11-04 DIAGNOSIS — I10 Essential (primary) hypertension: Secondary | ICD-10-CM

## 2018-11-04 DIAGNOSIS — F339 Major depressive disorder, recurrent, unspecified: Secondary | ICD-10-CM

## 2018-11-04 MED ORDER — VALSARTAN-HYDROCHLOROTHIAZIDE 160-25 MG PO TABS: 1.0000 | ORAL_TABLET | Freq: Every day | ORAL | 0 refills | Status: DC

## 2018-11-04 MED ORDER — SERTRALINE HCL 100 MG PO TABS: 100.0000 mg | ORAL_TABLET | Freq: Every day | ORAL | 0 refills | Status: DC

## 2018-11-04 MED ORDER — SIMVASTATIN 20 MG PO TABS: 20.0000 mg | ORAL_TABLET | Freq: Every day | ORAL | 0 refills | Status: DC

## 2018-11-04 MED ORDER — AMLODIPINE BESYLATE 10 MG PO TABS: 10.0000 mg | ORAL_TABLET | Freq: Every day | ORAL | 0 refills | Status: DC

## 2018-11-04 NOTE — Telephone Encounter (Signed)
Sent to the pharmacy by e-scribe. 

## 2018-11-08 ENCOUNTER — Other Ambulatory Visit: Payer: Self-pay

## 2018-11-10 ENCOUNTER — Encounter: Payer: Self-pay | Admitting: Internal Medicine

## 2018-11-10 ENCOUNTER — Ambulatory Visit (INDEPENDENT_AMBULATORY_CARE_PROVIDER_SITE_OTHER): Payer: PPO | Admitting: Internal Medicine

## 2018-11-10 ENCOUNTER — Other Ambulatory Visit: Payer: Self-pay

## 2018-11-10 VITALS — BP 140/60 | HR 91 | Ht 70.0 in | Wt 320.0 lb

## 2018-11-10 DIAGNOSIS — E782 Mixed hyperlipidemia: Secondary | ICD-10-CM | POA: Diagnosis not present

## 2018-11-10 DIAGNOSIS — E669 Obesity, unspecified: Secondary | ICD-10-CM

## 2018-11-10 DIAGNOSIS — Z23 Encounter for immunization: Secondary | ICD-10-CM

## 2018-11-10 DIAGNOSIS — Z794 Long term (current) use of insulin: Secondary | ICD-10-CM | POA: Diagnosis not present

## 2018-11-10 DIAGNOSIS — E1165 Type 2 diabetes mellitus with hyperglycemia: Secondary | ICD-10-CM | POA: Diagnosis not present

## 2018-11-10 DIAGNOSIS — E039 Hypothyroidism, unspecified: Secondary | ICD-10-CM

## 2018-11-10 DIAGNOSIS — E038 Other specified hypothyroidism: Secondary | ICD-10-CM

## 2018-11-10 LAB — POCT GLYCOSYLATED HEMOGLOBIN (HGB A1C): Hemoglobin A1C: 6.8 % — AB (ref 4.0–5.6)

## 2018-11-10 MED ORDER — METFORMIN HCL 1000 MG PO TABS: ORAL_TABLET | ORAL | 3 refills | Status: DC
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
## 2018-11-10 NOTE — Patient Instructions (Addendum)
Please change Insulin Before breakfast Before lunch Before dinner  Regular 40  40 40  NPH 50 >> 60 50 50 >> 60  - Metformin 1000 mg twice a day  Please return in 3-4 months with your sugar log.

## 2018-11-18 ENCOUNTER — Other Ambulatory Visit: Payer: Self-pay | Admitting: Internal Medicine

## 2018-11-26 ENCOUNTER — Other Ambulatory Visit: Payer: Self-pay | Admitting: Internal Medicine

## 2018-11-26 ENCOUNTER — Encounter: Payer: Self-pay | Admitting: Adult Health

## 2018-11-27 ENCOUNTER — Encounter: Payer: Self-pay | Admitting: Internal Medicine

## 2018-11-29 MED ORDER — INSULIN NPH (HUMAN) (ISOPHANE) 100 UNIT/ML ~~LOC~~ SUSP: 40.0000 [IU] | Freq: Three times a day (TID) | SUBCUTANEOUS | 0 refills | Status: DC

## 2018-11-29 MED ORDER — INSULIN REGULAR HUMAN 100 UNIT/ML IJ SOLN: 60.0000 [IU] | Freq: Three times a day (TID) | INTRAMUSCULAR | 0 refills | Status: DC

## 2018-11-30 ENCOUNTER — Other Ambulatory Visit: Payer: Self-pay | Admitting: Family Medicine

## 2018-11-30 DIAGNOSIS — G4733 Obstructive sleep apnea (adult) (pediatric): Secondary | ICD-10-CM

## 2018-11-30 DIAGNOSIS — Z9989 Dependence on other enabling machines and devices: Secondary | ICD-10-CM

## 2018-12-13 ENCOUNTER — Encounter: Payer: Self-pay | Admitting: Internal Medicine

## 2018-12-29 ENCOUNTER — Telehealth: Payer: Self-pay

## 2018-12-29 NOTE — Telephone Encounter (Signed)
Tried to call pt/wife and no luck. Tried to get reason for upcoming visit for Dr Claiborne Billings, looks like this is for CPAP. Sent message to sleep coordinator to see if she could call or get sleep provider/for download

## 2018-12-29 NOTE — Telephone Encounter (Signed)
Patient has appointment with pulmonary on 01/03/19

## 2018-12-31 ENCOUNTER — Ambulatory Visit: Payer: PPO | Admitting: Cardiovascular Disease

## 2019-01-01 ENCOUNTER — Encounter: Payer: Self-pay | Admitting: Internal Medicine

## 2019-01-03 ENCOUNTER — Ambulatory Visit: Payer: PPO | Admitting: Pulmonary Disease

## 2019-01-03 ENCOUNTER — Other Ambulatory Visit: Payer: Self-pay

## 2019-01-03 ENCOUNTER — Encounter: Payer: Self-pay | Admitting: Pulmonary Disease

## 2019-01-03 DIAGNOSIS — G4733 Obstructive sleep apnea (adult) (pediatric): Secondary | ICD-10-CM | POA: Diagnosis not present
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
## 2019-01-03 NOTE — Patient Instructions (Signed)
You will qualify for a new machine when your current machine runs out Call us and we can send in prescription for auto CPAP 12 to 20 cm

## 2019-01-03 NOTE — Assessment & Plan Note (Signed)
will qualify for a new machine when  current machine runs out Call us and we can send in prescription for auto CPAP 12 to 20 cm He may prefer to bite himself online rather than go through insurance He has been on CPAP for 35 years, this is certainly helped improve his daytime somnolence and fatigue, he cannot sleep without it and he is very compliant  Weight loss encouraged, compliance with goal of at least 4-6 hrs every night is the expectation. Advised against medications with sedative side effects Cautioned against driving when sleepy - understanding that sleepiness will vary on a day to day basis

## 2019-01-24 ENCOUNTER — Other Ambulatory Visit: Payer: Self-pay | Admitting: Internal Medicine

## 2019-01-26 ENCOUNTER — Other Ambulatory Visit: Payer: Self-pay | Admitting: Internal Medicine

## 2019-01-30 ENCOUNTER — Other Ambulatory Visit: Payer: Self-pay | Admitting: Adult Health

## 2019-01-30 DIAGNOSIS — I10 Essential (primary) hypertension: Secondary | ICD-10-CM

## 2019-01-30 DIAGNOSIS — F339 Major depressive disorder, recurrent, unspecified: Secondary | ICD-10-CM

## 2019-02-04 ENCOUNTER — Encounter: Payer: Self-pay | Admitting: Adult Health

## 2019-02-04 ENCOUNTER — Other Ambulatory Visit: Payer: Self-pay

## 2019-02-04 ENCOUNTER — Ambulatory Visit (INDEPENDENT_AMBULATORY_CARE_PROVIDER_SITE_OTHER): Payer: PPO | Admitting: Adult Health

## 2019-02-04 VITALS — BP 150/68 | HR 93 | Temp 97.4°F | Ht 68.75 in | Wt 327.0 lb

## 2019-02-04 DIAGNOSIS — E039 Hypothyroidism, unspecified: Secondary | ICD-10-CM | POA: Diagnosis not present

## 2019-02-04 DIAGNOSIS — Z0000 Encounter for general adult medical examination without abnormal findings: Secondary | ICD-10-CM

## 2019-02-04 DIAGNOSIS — Z23 Encounter for immunization: Secondary | ICD-10-CM

## 2019-02-04 DIAGNOSIS — E1165 Type 2 diabetes mellitus with hyperglycemia: Secondary | ICD-10-CM

## 2019-02-04 DIAGNOSIS — F339 Major depressive disorder, recurrent, unspecified: Secondary | ICD-10-CM | POA: Diagnosis not present

## 2019-02-04 DIAGNOSIS — I10 Essential (primary) hypertension: Secondary | ICD-10-CM | POA: Diagnosis not present

## 2019-02-04 DIAGNOSIS — G4733 Obstructive sleep apnea (adult) (pediatric): Secondary | ICD-10-CM | POA: Diagnosis not present

## 2019-02-04 DIAGNOSIS — K2100 Gastro-esophageal reflux disease with esophagitis, without bleeding: Secondary | ICD-10-CM

## 2019-02-04 DIAGNOSIS — Z794 Long term (current) use of insulin: Secondary | ICD-10-CM | POA: Diagnosis not present

## 2019-02-04 DIAGNOSIS — E782 Mixed hyperlipidemia: Secondary | ICD-10-CM | POA: Diagnosis not present

## 2019-02-04 DIAGNOSIS — E038 Other specified hypothyroidism: Secondary | ICD-10-CM

## 2019-02-04 LAB — LIPID PANEL
Cholesterol: 125 mg/dL (ref 0–200)
HDL: 38.9 mg/dL — ABNORMAL LOW (ref >39.00)
LDL Cholesterol: 48 mg/dL (ref 0–99)
NonHDL: 86.49
Total CHOL/HDL Ratio: 3
Triglycerides: 193 mg/dL — ABNORMAL HIGH (ref 0.0–149.0)
VLDL: 38.6 mg/dL (ref 0.0–40.0)

## 2019-02-04 LAB — COMPREHENSIVE METABOLIC PANEL
ALT: 26 U/L (ref 0–53)
AST: 20 U/L (ref 0–37)
Albumin: 3.9 g/dL (ref 3.5–5.2)
Alkaline Phosphatase: 120 U/L — ABNORMAL HIGH (ref 39–117)
BUN: 19 mg/dL (ref 6–23)
CO2: 24 mEq/L (ref 19–32)
Calcium: 9.2 mg/dL (ref 8.4–10.5)
Chloride: 102 mEq/L (ref 96–112)
Creatinine, Ser: 0.72 mg/dL (ref 0.40–1.50)
GFR: 108.15 mL/min (ref >60.00)
Glucose, Bld: 172 mg/dL — ABNORMAL HIGH (ref 70–99)
Potassium: 3.9 mEq/L (ref 3.5–5.1)
Sodium: 137 mEq/L (ref 135–145)
Total Bilirubin: 0.4 mg/dL (ref 0.2–1.2)
Total Protein: 7.1 g/dL (ref 6.0–8.3)

## 2019-02-04 LAB — CBC WITH DIFFERENTIAL/PLATELET
Basophils Absolute: 0 10*3/uL (ref 0.0–0.1)
Basophils Relative: 0.5 % (ref 0.0–3.0)
Eosinophils Absolute: 0.2 10*3/uL (ref 0.0–0.7)
Eosinophils Relative: 3 % (ref 0.0–5.0)
HCT: 41 % (ref 39.0–52.0)
Hemoglobin: 13.9 g/dL (ref 13.0–17.0)
Lymphocytes Relative: 23.8 % (ref 12.0–46.0)
Lymphs Abs: 1.2 10*3/uL (ref 0.7–4.0)
MCHC: 33.8 g/dL (ref 30.0–36.0)
MCV: 98.7 fl (ref 78.0–100.0)
Monocytes Absolute: 0.4 10*3/uL (ref 0.1–1.0)
Monocytes Relative: 7.3 % (ref 3.0–12.0)
Neutro Abs: 3.3 10*3/uL (ref 1.4–7.7)
Neutrophils Relative %: 65.4 % (ref 43.0–77.0)
Platelets: 95 10*3/uL — ABNORMAL LOW (ref 150.0–400.0)
RBC: 4.15 Mil/uL — ABNORMAL LOW (ref 4.22–5.81)
RDW: 13.8 % (ref 11.5–15.5)
WBC: 5.1 10*3/uL (ref 4.0–10.5)

## 2019-02-04 LAB — T3, FREE: T3, Free: 2.5 pg/mL (ref 2.3–4.2)

## 2019-02-04 LAB — T4, FREE: Free T4: 0.64 ng/dL (ref 0.60–1.60)

## 2019-02-04 LAB — TSH: TSH: 3.84 u[IU]/mL (ref 0.35–4.50)

## 2019-02-04 MED ORDER — OMEPRAZOLE 20 MG PO CPDR: 20.0000 mg | DELAYED_RELEASE_CAPSULE | Freq: Every day | ORAL | 0 refills | Status: DC
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
## 2019-02-04 NOTE — Patient Instructions (Signed)
Health Maintenance Due  Topic Date Due  . PNA vac Low Risk Adult (1 of 2 - PCV13) 10/24/2014  . FOOT EXAM  01/23/2018  . OPHTHALMOLOGY EXAM  06/11/2018    Depression screen [facility] 2/9 01/23/2017 01/23/2017 04/27/2015  Decreased Interest - 0 0  Down, Depressed, Hopeless 0 - 0  PHQ - 2 Score 0 0 0

## 2019-02-05 ENCOUNTER — Encounter: Payer: Self-pay | Admitting: Internal Medicine

## 2019-02-19 ENCOUNTER — Other Ambulatory Visit: Payer: Self-pay | Admitting: Adult Health

## 2019-02-19 DIAGNOSIS — K2100 Gastro-esophageal reflux disease with esophagitis, without bleeding: Secondary | ICD-10-CM

## 2019-02-23 ENCOUNTER — Other Ambulatory Visit: Payer: Self-pay | Admitting: Internal Medicine

## 2019-02-23 ENCOUNTER — Encounter: Payer: Self-pay | Admitting: Internal Medicine

## 2019-03-15 ENCOUNTER — Other Ambulatory Visit: Payer: Self-pay

## 2019-03-17 ENCOUNTER — Ambulatory Visit (INDEPENDENT_AMBULATORY_CARE_PROVIDER_SITE_OTHER): Payer: PPO | Admitting: Internal Medicine

## 2019-03-17 ENCOUNTER — Encounter: Payer: Self-pay | Admitting: Internal Medicine

## 2019-03-17 ENCOUNTER — Other Ambulatory Visit: Payer: Self-pay

## 2019-03-17 VITALS — BP 130/70 | HR 102 | Ht 68.75 in | Wt 325.0 lb

## 2019-03-17 DIAGNOSIS — E782 Mixed hyperlipidemia: Secondary | ICD-10-CM

## 2019-03-17 DIAGNOSIS — E669 Obesity, unspecified: Secondary | ICD-10-CM

## 2019-03-17 DIAGNOSIS — E038 Other specified hypothyroidism: Secondary | ICD-10-CM

## 2019-03-17 DIAGNOSIS — E1165 Type 2 diabetes mellitus with hyperglycemia: Secondary | ICD-10-CM | POA: Diagnosis not present

## 2019-03-17 DIAGNOSIS — E039 Hypothyroidism, unspecified: Secondary | ICD-10-CM | POA: Diagnosis not present

## 2019-03-17 DIAGNOSIS — Z794 Long term (current) use of insulin: Secondary | ICD-10-CM

## 2019-03-17 LAB — POCT GLYCOSYLATED HEMOGLOBIN (HGB A1C): Hemoglobin A1C: 7 % — AB (ref 4.0–5.6)
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
## 2019-03-17 NOTE — Addendum Note (Signed)
Addended by: Darliss Ridgel I on: 03/17/2019 11:27 AM   Modules accepted: Orders

## 2019-03-17 NOTE — Patient Instructions (Addendum)
Please change: Insulin Before breakfast Before lunch Before dinner  Regular (short, clear) 40  40 40 (if late snack: 20 units)  NPH (long, cloudy) 60 50 40-60   - Metformin 1000 mg 2x a day  Please return in 3-4 months with your sugar log.

## 2019-04-01 ENCOUNTER — Encounter: Payer: Self-pay | Admitting: Internal Medicine

## 2019-04-15 ENCOUNTER — Encounter: Payer: Self-pay | Admitting: Internal Medicine

## 2019-04-25 ENCOUNTER — Encounter: Payer: Self-pay | Admitting: Adult Health

## 2019-04-28 ENCOUNTER — Ambulatory Visit: Payer: PPO | Attending: Internal Medicine

## 2019-04-28 DIAGNOSIS — Z23 Encounter for immunization: Secondary | ICD-10-CM

## 2019-04-28 NOTE — Progress Notes (Signed)
   Covid-19 Vaccination Clinic  Name:  Billy Hale    MRN: [id number] DOB: June 30, 1950  04/28/2019  Billy Hale was observed post Covid-19 immunization for 15 minutes without incident. He was provided with Vaccine Information Sheet and instruction to access the V-Safe system.   Billy Hale was instructed to call 911 with any severe reactions post vaccine: Marland Kitchen Difficulty breathing  . Swelling of face and throat  . A fast heartbeat  . A bad rash all over body  . Dizziness and weakness   Immunizations Administered    Name Date Dose VIS Date Route   Pfizer COVID-19 Vaccine 04/28/2019 11:36 AM 0.3 mL 12/31/2018 Intramuscular   Manufacturer: [employer], [employer]   Lot: [id number]   NDC: [address]-7

## 2019-05-06 ENCOUNTER — Encounter: Payer: Self-pay | Admitting: Internal Medicine

## 2019-05-10 ENCOUNTER — Telehealth: Payer: Self-pay | Admitting: Adult Health

## 2019-05-10 NOTE — Progress Notes (Signed)
°  Chronic Care Management   Outreach Note  05/10/2019 Name: Sandford Diop MRN: [id number] DOB: 11-13-1950  Referred by: Shirline Frees, NP Reason for referral : No chief complaint on file.   An unsuccessful telephone outreach was attempted today. The patient was referred to the pharmacist for assistance with care management and care coordination.   Follow Up Plan:   Raynicia Dukes [employer] Scheduler

## 2019-05-10 NOTE — Chronic Care Management (AMB) (Signed)
  Chronic Care Management   Note  05/10/2019 Name: Billy Hale MRN: [id number] DOB: 07/11/1950  Billy Hale is a 69 y.o. year old male who is a primary care patient of Shirline Frees, NP. I reached out to Vanita Panda by phone today in response to a referral sent by Billy Hale's PCP, Shirline Frees, NP.   Billy Hale was given information about Chronic Care Management services today including:  1. CCM service includes personalized support from designated clinical staff supervised by his physician, including individualized plan of care and coordination with other care providers 2. 24/7 contact phone numbers for assistance for urgent and routine care needs. 3. Service will only be billed when office clinical staff spend 20 minutes or more in a month to coordinate care. 4. Only one practitioner may furnish and bill the service in a calendar month. 5. The patient may stop CCM services at any time (effective at the end of the month) by phone call to the office staff.   Patient agreed to services and verbal consent obtained.   Follow up plan:   Raynicia Dukes [facility]

## 2019-05-12 ENCOUNTER — Other Ambulatory Visit: Payer: Self-pay | Admitting: Internal Medicine

## 2019-05-13 ENCOUNTER — Other Ambulatory Visit: Payer: Self-pay | Admitting: Adult Health

## 2019-05-13 DIAGNOSIS — I10 Essential (primary) hypertension: Secondary | ICD-10-CM

## 2019-05-13 DIAGNOSIS — Z794 Long term (current) use of insulin: Secondary | ICD-10-CM

## 2019-05-13 DIAGNOSIS — E1165 Type 2 diabetes mellitus with hyperglycemia: Secondary | ICD-10-CM

## 2019-05-16 ENCOUNTER — Other Ambulatory Visit: Payer: Self-pay

## 2019-05-16 ENCOUNTER — Other Ambulatory Visit: Payer: Self-pay | Admitting: Adult Health

## 2019-05-16 ENCOUNTER — Ambulatory Visit: Payer: PPO

## 2019-05-16 DIAGNOSIS — K2100 Gastro-esophageal reflux disease with esophagitis, without bleeding: Secondary | ICD-10-CM

## 2019-05-16 DIAGNOSIS — F339 Major depressive disorder, recurrent, unspecified: Secondary | ICD-10-CM

## 2019-05-16 DIAGNOSIS — E782 Mixed hyperlipidemia: Secondary | ICD-10-CM

## 2019-05-16 DIAGNOSIS — E1165 Type 2 diabetes mellitus with hyperglycemia: Secondary | ICD-10-CM

## 2019-05-16 DIAGNOSIS — Z794 Long term (current) use of insulin: Secondary | ICD-10-CM

## 2019-05-16 DIAGNOSIS — I10 Essential (primary) hypertension: Secondary | ICD-10-CM

## 2019-05-16 NOTE — Chronic Care Management (AMB) (Signed)
Chronic Care Management Pharmacy  Name: Billy Hale  MRN: [id number] DOB: 1950-01-22  Initial Questions: 1. Have you seen any other providers since your last visit? Na 2. Any changes in your medicines or health? No   Chief Complaint/ HPI Billy Hale,  69 y.o. , male presents for their Initial CCM visit with the clinical pharmacist via telephone due to COVID-19 Pandemic.  PCP : Shirline Frees, NP  Their chronic conditions include: DM, HTN, HLD, Depression, GERD   Office Visits: 02/04/2019- Patient presented for office visit with Shirline Frees, NP for annual exam. Patient encouraged to lose weight. Labs ordered: CBC, CMP, lipid panel, TSH, T3, T4. No changed to current regimen.   Consult Visit: 03/17/2019- Endocrinology- Patient presented for office visit with Dr. Belva Crome, MD for follow up. No changes on DM regimen. Patient to continue insulin regular, 40 units before breakfast, lunch, and dinner and 20 units with late snack AND insulin NPH, 60 units before breakfast, 50 units before lunch, and 40-60 units before dinner AND metformin 1000mg  BID. Patient to continue Zocor. Patient advised to stay off milk. Patient to return in 3 to 4 months.   01/03/2019- Pulmonology- Patient presented for office visit with Dr. 01/05/2019, MD to establish care for OSA. Patient to obtain new machine when his current one runs out. Patient encouraged for weight loss.   Medications: Outpatient Encounter Medications as of 05/16/2019  Medication Sig  . amLODipine (NORVASC) 10 MG tablet Take 1 tablet (10 mg total) by mouth daily.  05/18/2019 aspirin EC 81 MG tablet Take 81 mg by mouth daily.  . fexofenadine (ALLEGRA) 180 MG tablet Take 180 mg by mouth daily.  . insulin NPH Human (NOVOLIN N RELION) 100 UNIT/ML injection Inject 0.4 mLs (40 Units total) into the skin 3 (three) times daily. (Patient taking differently: Inject into the skin. 60 units before breakfast, 50 units before lunch, 40-60 units before  dinner)  . insulin regular (NOVOLIN R RELION) 100 units/mL injection INJECT 60 UNITS SUBCUTANEOUSLY THREE TIMES DAILY  . metFORMIN (GLUCOPHAGE) 1000 MG tablet TAKE 2 TABLETS BY MOUTH EVERY DAY WITH SUPPER (Patient taking differently: 1,000 mg 2 (two) times daily with a meal. )  . valsartan-hydrochlorothiazide (DIOVAN-HCT) 160-25 MG tablet Take 1 tablet by mouth daily.  . [DISCONTINUED] amLODipine (NORVASC) 10 MG tablet TAKE 1 TABLET BY MOUTH EVERY DAY  . [DISCONTINUED] BD INSULIN SYRINGE U/F 31G X 5/16" 1 ML MISC USE 3 TIMES DAILY BEFORE MEALS  . [DISCONTINUED] insulin NPH Human (NOVOLIN N RELION) 100 UNIT/ML injection Inject 0.4 mLs (40 Units total) into the skin 3 (three) times daily.  . [DISCONTINUED] metFORMIN (GLUCOPHAGE) 1000 MG tablet TAKE 2 TABLETS BY MOUTH EVERY DAY WITH SUPPER  . [DISCONTINUED] NOVOLIN R RELION 100 UNIT/ML injection INJECT 60 UNITS SUBCUTANEOUSLY THREE TIMES DAILY  . [DISCONTINUED] omeprazole (PRILOSEC) 20 MG capsule TAKE 1 CAPSULE (20 MG TOTAL) BY MOUTH DAILY. X 4 WEEKS  . [DISCONTINUED] ONETOUCH VERIO test strip USE AS DIRECTED 3 TIMES A DAY  . [DISCONTINUED] sertraline (ZOLOFT) 100 MG tablet TAKE 1 TABLET BY MOUTH EVERY DAY  . [DISCONTINUED] simvastatin (ZOCOR) 20 MG tablet TAKE 1 TABLET BY MOUTH EVERY DAY  . [DISCONTINUED] valsartan-hydrochlorothiazide (DIOVAN-HCT) 160-25 MG tablet TAKE 1 TABLET BY MOUTH EVERY DAY   No facility-administered encounter medications on file as of 05/16/2019.     Current Diagnosis/Assessment:  Goals Addressed            This Visit's Progress   . Pharmacy Care  Plan       CARE PLAN ENTRY  Current Barriers:  . Chronic Disease Management support, education, and care coordination needs related to Hypertension, Hyperlipidemia, Diabetes, Gastroesophageal Reflux Disease, and Depression   Hypertension . Pharmacist Clinical Goal(s): o Over the next 180 days, patient will work with PharmD and providers to maintain BP goal < 140/90 .  Current regimen:   Amlodipine 10mg , 1 tablet once daily   Valsartan-hydrochlorothiazide 160-25mg , 1 tablet once daily  . Interventions: o We discussed:  . DASH diet:  following a diet emphasizing fruits and vegetables and low-fat dairy products along with whole grains, fish, poultry, and nuts. Reducing red meats and sugars. . Exercising  . Reducing the amount of salt intake to 1500mg /per day.  . Patient self care activities - Over the next 180 days, patient will: o Ensure DASH diet, exercise regimen, and daily salt intake < 1500 mg/day  Hyperlipidemia . Pharmacist Clinical Goal(s): o Over the next 180 days, patient will work with PharmD and providers to maintain LDL goal < 100 . Current regimen:  o Simvastatin 20mg , 1 tablet once daily . Interventions: . How to reduce cholesterol through diet/weight management and physical activity.    . We discussed how a diet high in plant sterols (fruits/vegetables/nuts/whole grains/legumes) may reduce your cholesterol.  Encouraged increasing fiber to a daily intake of 10-25g/day . Patient self care activities - Over the next 180 days, patient will: . Engage in at least 150 minutes per week of moderate-intensity exercise such as brisk walking (15- to 20-minute mile) or something similar. Recommended they incorporate flexibility, balance, and some type of strength training exercises.   Diabetes . Pharmacist Clinical Goal(s): o Over the next 180 days, patient will work with PharmD and providers to maintain A1c goal < 7.0% . Current regimen:   Insulin NPH,60 units before breakfast, 50 units before lunch, and 40-60 units before dinner  Metformin 1000mg , 1 tablet twice daily   Insulin R, inject 60 units three times daily  . Patient self care activities - Over the next 60 days, patient will: o Checking blood sugar, document, and provide at future appointments o Contact provider with any episodes of hypoglycemia o Maintain follow up appointment with  Dr. Cruzita Lederer on 07/14/2019  Depression . Pharmacist Clinical Goal(s) o Over the next 180 days, patient will continue current regimen and monitor symptoms of depression.  . Current regimen:  o Sertraline 100mg , 1 tablet once daily   GERD . Pharmacist Clinical Goal(s) o Over the next 180 days, patient will continue current regimen as needed and monitor symptoms of acid reflux  . Current regimen:  o Omeprazole 20mg , 1 capsule once daily . Interventions: o We discussed:  non-pharmacological interventions for acid reflux. Take measures to prevent acid reflux, such as avoiding spicy foods, avoiding caffeine, avoid laying down a few hours after eating, and raising the head of the bed   Medication management . Pharmacist Clinical Goal(s): o Over the next 30 days, patient will work with PharmD and providers to maintain optimal medication adherence . Current pharmacy: Transitioning to [facility] . Interventions o Comprehensive medication review performed. o Utilize [facility] for medication synchronization, packaging and delivery . Patient self care activities - Over the next 180 days, patient will: o Take medications as prescribed o Report any questions or concerns to PharmD and/or provider(s)  Initial goal documentation       SDOH Interventions     Most Recent Value  SDOH Interventions  SDOH Interventions  for the Following Domains  Financial Strain  Financial Strain Interventions  Other (Comment) [Not needed]       Diabetes   Recent Relevant Labs: Lab Results  Component Value Date/Time   HGBA1C 7.0 (A) 03/17/2019 11:26 AM   HGBA1C 6.8 (A) 11/10/2018 10:57 AM   HGBA1C 8.6 (H) 01/23/2017 09:46 AM   HGBA1C 8.1 (H) 04/20/2015 09:02 AM   MICROALBUR 61.9 (H) 01/06/2018 09:21 AM   MICROALBUR 43.0 (H) 04/20/2015 09:02 AM    Patient has failed these meds in past: Invokana, pioglitazone, Lantus   Patient is currently controlled on the following medications:    Insulin NPH,60 units before breakfast, 50 units before lunch, and 40-60 units before dinner  Metformin 1000mg , 1 tablet twice daily   Insulin R, inject 60 units three times daily   Last diabetic Eye exam:  Lab Results  Component Value Date/Time   HMDIABEYEEXA No Retinopathy 06/10/2017 12:00 AM  - patient reported has plans to obtain this year.    Last diabetic Foot exam:  Lab Results  Component Value Date/Time   HMDIABFOOTEX done 11/08/2010 12:00 AM    Plan Managed by Dr. 11/10/2010 (endo) Continue current medications   Hypertension   BP today is:  <140/90  Office blood pressures are  BP Readings from Last 3 Encounters:  03/17/19 130/70  02/04/19 (!) 150/68  01/03/19 134/72   Patient has failed these meds in the past: benazepril, losartan  Patient checks BP at home: has not been checking at home since he reports BP has been okay at provider's visits   Patient is controlled (based on office BP) on:   Amlodipine 10mg , 1 tablet once daily   Valsartan-hydrochlorothiazide 160-25mg , 1 tablet once daily   We discussed diet and exercise extensively . DASH diet:  following a diet emphasizing fruits and vegetables and low-fat dairy products along with whole grains, fish, poultry, and nuts. Reducing red meats and sugars. . Exercising  . Reducing the amount of salt intake to 1500mg /per day.  . Recommend using a salt substitute to replace your salt if you need flavor.       Plan Continue current medications.    Hyperlipidemia   Lipid Panel     Component Value Date/Time   CHOL 125 02/04/2019 0943   TRIG 193.0 (H) 02/04/2019 0943   HDL 38.90 (L) 02/04/2019 0943   CHOLHDL 3 02/04/2019 0943   VLDL 38.6 02/04/2019 0943   LDLCALC 48 02/04/2019 0943   LDLDIRECT 72.9 11/01/2010 0801    The ASCVD Risk score (Goff DC Jr., et al., 2013) failed to calculate for the following reasons:   The valid total cholesterol range is 130 to 320 mg/dL   Patient has failed these  meds in past: fenofibrate  Patient is currently uncontrolled (triglycerides and HDL) on the following medications:   Simvastatin 20mg , 1 tablet once daily   We discussed:  diet and exercise extensively . How to reduce cholesterol through diet/weight management and physical activity.    . We discussed how a diet high in plant sterols (fruits/vegetables/nuts/whole grains/legumes) may reduce your cholesterol.  Encouraged increasing fiber to a daily intake of 10-25g/day   Plan Continue current medications and control with diet and exercise  Depression  Patient has failed these meds in past: none  Patient is currently controlled on the following medications:   Sertraline 100mg , 1 tablet once daily   Plan Continue current medications.   GERD  Patient has failed these meds in past: none  Patient is currently controlled on the following medications:   Omeprazole 20mg , 1 capsule once daily (patient reported not taking everyday).   We discussed:  non-pharmacological interventions for acid reflux. Take measures to prevent acid reflux, such as avoiding spicy foods, avoiding caffeine, avoid laying down a few hours after eating, and raising the head of the bed.  Plan Continue current medications   Medication Management  Spouse transitioned to [facility] enhanced pharmacy services. Patient wanting to transition as well.   Verbal consent obtained for [facility] enhanced pharmacy services (medication synchronization, adherence packaging, delivery coordination). A medication sync plan was created to allow patient to get all medications delivered once every 30 to 90 days per patient preference. Patient understands they have freedom to choose pharmacy and clinical pharmacist will coordinate care between all prescribers and [facility].   Follow up Follow up visit with PharmD in 6 months.  Will conduct general telephone calls for periodic check-ins before next visit.    , PharmD Clinical Pharmacist [facility] [place] [phone]

## 2019-05-17 ENCOUNTER — Telehealth: Payer: Self-pay | Admitting: Internal Medicine

## 2019-05-17 ENCOUNTER — Ambulatory Visit: Payer: Self-pay

## 2019-05-17 ENCOUNTER — Other Ambulatory Visit: Payer: Self-pay | Admitting: Adult Health

## 2019-05-17 DIAGNOSIS — I10 Essential (primary) hypertension: Secondary | ICD-10-CM

## 2019-05-17 DIAGNOSIS — K2100 Gastro-esophageal reflux disease with esophagitis, without bleeding: Secondary | ICD-10-CM

## 2019-05-17 DIAGNOSIS — F339 Major depressive disorder, recurrent, unspecified: Secondary | ICD-10-CM

## 2019-05-17 MED ORDER — AMLODIPINE BESYLATE 10 MG PO TABS: 10.0000 mg | ORAL_TABLET | Freq: Every day | ORAL | 1 refills | Status: DC

## 2019-05-17 MED ORDER — OMEPRAZOLE 20 MG PO CPDR: 20.0000 mg | DELAYED_RELEASE_CAPSULE | Freq: Every day | ORAL | 2 refills | Status: DC

## 2019-05-17 MED ORDER — SERTRALINE HCL 100 MG PO TABS: 100.0000 mg | ORAL_TABLET | Freq: Every day | ORAL | 2 refills | Status: DC

## 2019-05-17 MED ORDER — VALSARTAN-HYDROCHLOROTHIAZIDE 160-25 MG PO TABS: 1.0000 | ORAL_TABLET | Freq: Every day | ORAL | 2 refills | Status: DC

## 2019-05-17 MED ORDER — SIMVASTATIN 20 MG PO TABS: 20.0000 mg | ORAL_TABLET | Freq: Every day | ORAL | 2 refills | Status: DC

## 2019-05-17 NOTE — Telephone Encounter (Signed)
Medication Refill Request  Did you call your pharmacy and request this refill first? N/A  . If patient has not contacted pharmacy first, instruct them to do so for future refills.  . Remind them that contacting the pharmacy for their refill is the quickest method to get the refill.  . Refill policy also stated that it will take anywhere between 24-72 hours to receive the refill.    Name of medication? Metformin, BD insulin syringes, one touch verio test strips, insulin NPH, novolin R  Is this a 90 day supply? yes  Name and location of pharmacy?  [facility] - [place], [place] - [address] Phone:  [phone]  Fax:  [phone]       . Is the request for diabetes test strips? yes . If yes, what brand? One touch verio test strips

## 2019-05-17 NOTE — Telephone Encounter (Signed)
DUPLICATE REQUEST.  FILLED EARLIER TODAY BY [facility].

## 2019-05-17 NOTE — Chronic Care Management (AMB) (Signed)
Verbal consent obtained for [facility] enhanced pharmacy services (medication synchronization, adherence packaging, delivery coordination). A medication sync plan was created to allow patient to get all medications delivered once every 30 to 90 days per patient preference. Patient understands they have freedom to choose pharmacy and clinical pharmacist will coordinate care between all prescribers and [facility].  Patient obtains medications through Adherence Packaging  90 Days   Patient is due for next adherence delivery on: 05/18/2019 Reviewed medications with patient and coordinated transition into adherence packaging.  This delivery to include: Amlodipine 10mg  1 tablet in the morning  Metformin 1000mg  1 tablet in the morning and the evening  Sertraline 100mg  1 tablet in the morning  Simvastatin 20mg  1 tablet in the evening  Valsartan/ HCTZ 160/25mg  1 tablet in the morning  Aspirin 81mg  1 tablet in the morning  BD Insulin syringes 31G x 5/16" 1 ML    Patient declined the following medications: Omeprazole 20mg  Patient uses as needed; patient will call to coordinate delivery  OneTouch Verio Test strips  Last filled 05/12/2019 for 90DS; next fill due: 08/09/2019  Insulin NPH (Novolin N) Last filled on 05/03/2019 for 84 day supply (assessing cost comparison with [facility])  Insulin R (Novolin R) Last filled on 05/03/2019 for 56 day supply (assessing cost comparison with [facility])     Contacted the following providers and requested refills:  6/16 (PCP)  Amlodipine 10mg   Sertraline 100mg   Simvastatin 20mg    Valsartan/ HCTZ 160/25mg    Omeprazole 20mg    (endocrinologist)  Metformin 1000mg   BD insulin syringes, 31G x 5/16" 1 ML  OneTouch Verio test strips   Insulin NPH (Novolin N)  Insulin R (Novolin R)   Confirmed delivery date of 05/18/2019, advised patient that pharmacy will contact them the morning of delivery of day before.  05/05/2019, PharmD Clinical Pharmacist [facility] [place] [phone]

## 2019-05-18 DIAGNOSIS — H2513 Age-related nuclear cataract, bilateral: Secondary | ICD-10-CM | POA: Diagnosis not present

## 2019-05-18 DIAGNOSIS — E113293 Type 2 diabetes mellitus with mild nonproliferative diabetic retinopathy without macular edema, bilateral: Secondary | ICD-10-CM | POA: Diagnosis not present

## 2019-05-18 DIAGNOSIS — H40013 Open angle with borderline findings, low risk, bilateral: Secondary | ICD-10-CM | POA: Diagnosis not present

## 2019-05-18 LAB — HM DIABETES EYE EXAM

## 2019-05-18 MED ORDER — ONETOUCH VERIO VI STRP: ORAL_STRIP | 12 refills | Status: DC

## 2019-05-18 MED ORDER — INSULIN REGULAR HUMAN 100 UNIT/ML IJ SOLN: INTRAMUSCULAR | 1 refills | Status: DC

## 2019-05-18 MED ORDER — INSULIN NPH (HUMAN) (ISOPHANE) 100 UNIT/ML ~~LOC~~ SUSP: 40.0000 [IU] | Freq: Three times a day (TID) | SUBCUTANEOUS | 3 refills | Status: DC

## 2019-05-18 MED ORDER — METFORMIN HCL 1000 MG PO TABS: ORAL_TABLET | ORAL | 3 refills | Status: DC

## 2019-05-18 MED ORDER — "INSULIN SYRINGE-NEEDLE U-100 31G X 5/16"" 1 ML MISC": 2 refills | Status: DC

## 2019-05-18 NOTE — Telephone Encounter (Signed)
These have been sent 

## 2019-05-18 NOTE — Addendum Note (Signed)
Addended by: Darliss Ridgel I on: 05/18/2019 11:52 AM   Modules accepted: Orders

## 2019-05-20 NOTE — Patient Instructions (Addendum)
Visit Information  Goals Addressed            This Visit's Progress   . Pharmacy Care Plan       CARE PLAN ENTRY  Current Barriers:  . Chronic Disease Management support, education, and care coordination needs related to Hypertension, Hyperlipidemia, Diabetes, Gastroesophageal Reflux Disease, and Depression   Hypertension . Pharmacist Clinical Goal(s): o Over the next 180 days, patient will work with PharmD and providers to maintain BP goal < 140/90 . Current regimen:   Amlodipine 10mg , 1 tablet once daily   Valsartan-hydrochlorothiazide 160-25mg , 1 tablet once daily  . Interventions: o We discussed:  . DASH diet:  following a diet emphasizing fruits and vegetables and low-fat dairy products along with whole grains, fish, poultry, and nuts. Reducing red meats and sugars. . Exercising  . Reducing the amount of salt intake to 1500mg /per day.  . Patient self care activities - Over the next 180 days, patient will: o Ensure DASH diet, exercise regimen, and daily salt intake < 1500 mg/day  Hyperlipidemia . Pharmacist Clinical Goal(s): o Over the next 180 days, patient will work with PharmD and providers to maintain LDL goal < 100 . Current regimen:  o Simvastatin 20mg , 1 tablet once daily . Interventions: . How to reduce cholesterol through diet/weight management and physical activity.    . We discussed how a diet high in plant sterols (fruits/vegetables/nuts/whole grains/legumes) may reduce your cholesterol.  Encouraged increasing fiber to a daily intake of 10-25g/day . Patient self care activities - Over the next 180 days, patient will: . Engage in at least 150 minutes per week of moderate-intensity exercise such as brisk walking (15- to 20-minute mile) or something similar. Recommended they incorporate flexibility, balance, and some type of strength training exercises.   Diabetes . Pharmacist Clinical Goal(s): o Over the next 180 days, patient will work with PharmD and  providers to maintain A1c goal < 7.0% . Current regimen:   Insulin NPH,60 units before breakfast, 50 units before lunch, and 40-60 units before dinner  Metformin 1000mg , 1 tablet twice daily   Insulin R, inject 60 units three times daily  . Patient self care activities - Over the next 60 days, patient will: o Checking blood sugar, document, and provide at future appointments o Contact provider with any episodes of hypoglycemia o Maintain follow up appointment with Dr. Cruzita Lederer on 07/14/2019  Depression . Pharmacist Clinical Goal(s) o Over the next 180 days, patient will continue current regimen and monitor symptoms of depression.  . Current regimen:  o Sertraline 100mg , 1 tablet once daily   GERD . Pharmacist Clinical Goal(s) o Over the next 180 days, patient will continue current regimen as needed and monitor symptoms of acid reflux  . Current regimen:  o Omeprazole 20mg , 1 capsule once daily . Interventions: o We discussed:  non-pharmacological interventions for acid reflux. Take measures to prevent acid reflux, such as avoiding spicy foods, avoiding caffeine, avoid laying down a few hours after eating, and raising the head of the bed   Medication management . Pharmacist Clinical Goal(s): o Over the next 30 days, patient will work with PharmD and providers to maintain optimal medication adherence . Current pharmacy: Transitioning to [facility] . Interventions o Comprehensive medication review performed. o Utilize [facility] for medication synchronization, packaging and delivery . Patient self care activities - Over the next 180 days, patient will: o Take medications as prescribed o Report any questions or concerns to PharmD and/or provider(s)  Initial  goal documentation        Mr. Kantner was given information about Chronic Care Management services today including:  1. CCM service includes personalized support from designated clinical staff supervised by his  physician, including individualized plan of care and coordination with other care providers 2. 24/7 contact phone numbers for assistance for urgent and routine care needs. 3. Standard insurance, coinsurance, copays and deductibles apply for chronic care management only during months in which we provide at least 20 minutes of these services. Most insurances cover these services at 100%, however patients may be responsible for any copay, coinsurance and/or deductible if applicable. This service may help you avoid the need for more expensive face-to-face services. 4. Only one practitioner may furnish and bill the service in a calendar month. 5. The patient may stop CCM services at any time (effective at the end of the month) by phone call to the office staff.  Patient agreed to services and verbal consent obtained.   The patient verbalized understanding of instructions provided today and agreed to receive a mailed copy of patient instruction and/or educational materials. Telephone follow up appointment with pharmacy team member scheduled for: 11/15/2019  Laural Benes, PharmD Clinical Pharmacist [facility] [place] [phone]    DASH Eating Plan DASH stands for "Dietary Approaches to Stop Hypertension." The DASH eating plan is a healthy eating plan that has been shown to reduce high blood pressure (hypertension). It may also reduce your risk for type 2 diabetes, heart disease, and stroke. The DASH eating plan may also help with weight loss. What are tips for following this plan?  General guidelines  Avoid eating more than 2,300 mg (milligrams) of salt (sodium) a day. If you have hypertension, you may need to reduce your sodium intake to 1,500 mg a day.  Limit alcohol intake to no more than 1 drink a day for nonpregnant women and 2 drinks a day for men. One drink equals 12 oz of beer, 5 oz of wine, or 1 oz of hard liquor.  Work with your health care provider to  maintain a healthy body weight or to lose weight. Ask what an ideal weight is for you.  Get at least 30 minutes of exercise that causes your heart to beat faster (aerobic exercise) most days of the week. Activities may include walking, swimming, or biking.  Work with your health care provider or diet and nutrition specialist (dietitian) to adjust your eating plan to your individual calorie needs. Reading food labels   Check food labels for the amount of sodium per serving. Choose foods with less than 5 percent of the Daily Value of sodium. Generally, foods with less than 300 mg of sodium per serving fit into this eating plan.  To find whole grains, look for the word "whole" as the first word in the ingredient list. Shopping  Buy products labeled as "low-sodium" or "no salt added."  Buy fresh foods. Avoid canned foods and premade or frozen meals. Cooking  Avoid adding salt when cooking. Use salt-free seasonings or herbs instead of table salt or sea salt. Check with your health care provider or pharmacist before using salt substitutes.  Do Billy fry foods. Cook foods using healthy methods such as baking, boiling, grilling, and broiling instead.  Cook with heart-healthy oils, such as olive, canola, soybean, or sunflower oil. Meal planning  Eat a balanced diet that includes: ? 5 or more servings of fruits and vegetables each day. At each meal, try  to fill half of your plate with fruits and vegetables. ? Up to 6-8 servings of whole grains each day. ? Less than 6 oz of lean meat, poultry, or fish each day. A 3-oz serving of meat is about the same size as a deck of cards. One egg equals 1 oz. ? 2 servings of low-fat dairy each day. ? A serving of nuts, seeds, or beans 5 times each week. ? Heart-healthy fats. Healthy fats called Omega-3 fatty acids are found in foods such as flaxseeds and coldwater fish, like sardines, salmon, and mackerel.  Limit how much you eat of the following: ? Canned  or prepackaged foods. ? Food that is high in trans fat, such as fried foods. ? Food that is high in saturated fat, such as fatty meat. ? Sweets, desserts, sugary drinks, and other foods with added sugar. ? Full-fat dairy products.  Do Billy salt foods before eating.  Try to eat at least 2 vegetarian meals each week.  Eat more home-cooked food and less restaurant, buffet, and fast food.  When eating at a restaurant, ask that your food be prepared with less salt or no salt, if possible. What foods are recommended? The items listed may Billy be a complete list. Talk with your dietitian about what dietary choices are best for you. Grains Whole-grain or whole-wheat bread. Whole-grain or whole-wheat pasta. Brown rice. [place]. Bulgur. Whole-grain and low-sodium cereals. Pita bread. Low-fat, low-sodium crackers. Whole-wheat flour tortillas. Vegetables Fresh or frozen vegetables (raw, steamed, roasted, or grilled). Low-sodium or reduced-sodium tomato and vegetable juice. Low-sodium or reduced-sodium tomato sauce and tomato paste. Low-sodium or reduced-sodium canned vegetables. Fruits All fresh, dried, or frozen fruit. Canned fruit in natural juice (without added sugar). Meat and other protein foods Skinless chicken or [place]. Ground chicken or [place]. Pork with fat trimmed off. Fish and seafood. Egg whites. Dried beans, peas, or lentils. Unsalted nuts, nut butters, and seeds. Unsalted canned beans. Lean cuts of beef with fat trimmed off. Low-sodium, lean deli meat. Dairy Low-fat (1%) or fat-free (skim) milk. Fat-free, low-fat, or reduced-fat cheeses. Nonfat, low-sodium ricotta or cottage cheese. Low-fat or nonfat yogurt. Low-fat, low-sodium cheese. Fats and oils Soft margarine without trans fats. Vegetable oil. Low-fat, reduced-fat, or light mayonnaise and salad dressings (reduced-sodium). Canola, safflower, olive, soybean, and sunflower oils. Avocado. Seasoning and other foods Herbs. Spices.  Seasoning mixes without salt. Unsalted popcorn and pretzels. Fat-free sweets. What foods are Billy recommended? The items listed may Billy be a complete list. Talk with your dietitian about what dietary choices are best for you. Grains Baked goods made with fat, such as croissants, muffins, or some breads. Dry pasta or rice meal packs. Vegetables Creamed or fried vegetables. Vegetables in a cheese sauce. Regular canned vegetables (Billy low-sodium or reduced-sodium). Regular canned tomato sauce and paste (Billy low-sodium or reduced-sodium). Regular tomato and vegetable juice (Billy low-sodium or reduced-sodium). Angie Fava. Olives. Fruits Canned fruit in a light or heavy syrup. Fried fruit. Fruit in cream or butter sauce. Meat and other protein foods Fatty cuts of meat. Ribs. Fried meat. Berniece Salines. Sausage. Bologna and other processed lunch meats. Salami. Fatback. Hotdogs. Bratwurst. Salted nuts and seeds. Canned beans with added salt. Canned or smoked fish. Whole eggs or egg yolks. Chicken or [place] with skin. Dairy Whole or 2% milk, cream, and half-and-half. Whole or full-fat cream cheese. Whole-fat or sweetened yogurt. Full-fat cheese. Nondairy creamers. Whipped toppings. Processed cheese and cheese spreads. Fats and oils Butter. Stick margarine. Lard. Shortening. Ghee. Berniece Salines  fat. Tropical oils, such as coconut, palm kernel, or palm oil. Seasoning and other foods Salted popcorn and pretzels. Onion salt, garlic salt, seasoned salt, table salt, and sea salt. Worcestershire sauce. Tartar sauce. Barbecue sauce. Teriyaki sauce. Soy sauce, including reduced-sodium. Steak sauce. Canned and packaged gravies. Fish sauce. Oyster sauce. Cocktail sauce. Horseradish that you find on the shelf. Ketchup. Mustard. Meat flavorings and tenderizers. Bouillon cubes. Hot sauce and Tabasco sauce. Premade or packaged marinades. Premade or packaged taco seasonings. Relishes. Regular salad dressings. Where to find more  information:  National Heart, Lung, and [place]: https://wilson-eaton.com/  American Heart Association: www.heart.org Summary  The DASH eating plan is a healthy eating plan that has been shown to reduce high blood pressure (hypertension). It may also reduce your risk for type 2 diabetes, heart disease, and stroke.  With the DASH eating plan, you should limit salt (sodium) intake to 2,300 mg a day. If you have hypertension, you may need to reduce your sodium intake to 1,500 mg a day.  When on the DASH eating plan, aim to eat more fresh fruits and vegetables, whole grains, lean proteins, low-fat dairy, and heart-healthy fats.  Work with your health care provider or diet and nutrition specialist (dietitian) to adjust your eating plan to your individual calorie needs. This information is Billy intended to replace advice given to you by your health care provider. Make sure you discuss any questions you have with your health care provider. Document Revised: 12/19/2016 Document Reviewed: 12/31/2015 Elsevier Patient Education  2020 [employer].

## 2019-05-23 ENCOUNTER — Ambulatory Visit: Payer: PPO | Attending: Internal Medicine

## 2019-05-23 DIAGNOSIS — Z23 Encounter for immunization: Secondary | ICD-10-CM

## 2019-05-23 NOTE — Progress Notes (Signed)
   Covid-19 Vaccination Clinic  Name:  Billy Hale    MRN: [id number] DOB: [birth year]  05/23/2019  [name] was observed post Covid-19 immunization for 15 minutes without incident. He was provided with Vaccine Information Sheet and instruction to access the V-Safe system.   [name] was instructed to call 911 with any severe reactions post vaccine: Marland Kitchen Difficulty breathing  . Swelling of face and throat  . A fast heartbeat  . A bad rash all over body  . Dizziness and weakness   Immunizations Administered    Name Date Dose VIS Date Route   Pfizer COVID-19 Vaccine 05/23/2019 12:00 PM 0.3 mL 03/16/2018 Intramuscular   Manufacturer: [employer], [employer]   Lot: [id number]   NDC: [address]-8

## 2019-05-26 DIAGNOSIS — L853 Xerosis cutis: Secondary | ICD-10-CM | POA: Diagnosis not present

## 2019-05-26 DIAGNOSIS — E11628 Type 2 diabetes mellitus with other skin complications: Secondary | ICD-10-CM | POA: Diagnosis not present

## 2019-05-26 DIAGNOSIS — M7742 Metatarsalgia, left foot: Secondary | ICD-10-CM | POA: Diagnosis not present

## 2019-05-26 DIAGNOSIS — M7741 Metatarsalgia, right foot: Secondary | ICD-10-CM | POA: Diagnosis not present

## 2019-05-27 ENCOUNTER — Encounter: Payer: Self-pay | Admitting: Internal Medicine

## 2019-06-26 ENCOUNTER — Encounter: Payer: Self-pay | Admitting: Internal Medicine

## 2019-07-14 ENCOUNTER — Ambulatory Visit: Payer: PPO | Admitting: Internal Medicine

## 2019-07-14 ENCOUNTER — Other Ambulatory Visit: Payer: Self-pay

## 2019-07-14 ENCOUNTER — Encounter: Payer: Self-pay | Admitting: Internal Medicine

## 2019-07-14 VITALS — BP 118/80 | HR 92 | Ht 68.75 in | Wt 319.0 lb

## 2019-07-14 DIAGNOSIS — E782 Mixed hyperlipidemia: Secondary | ICD-10-CM

## 2019-07-14 DIAGNOSIS — E039 Hypothyroidism, unspecified: Secondary | ICD-10-CM | POA: Diagnosis not present

## 2019-07-14 DIAGNOSIS — E038 Other specified hypothyroidism: Secondary | ICD-10-CM

## 2019-07-14 DIAGNOSIS — E669 Obesity, unspecified: Secondary | ICD-10-CM

## 2019-07-14 DIAGNOSIS — E1165 Type 2 diabetes mellitus with hyperglycemia: Secondary | ICD-10-CM | POA: Diagnosis not present

## 2019-07-14 DIAGNOSIS — Z794 Long term (current) use of insulin: Secondary | ICD-10-CM | POA: Diagnosis not present

## 2019-07-14 LAB — POCT GLYCOSYLATED HEMOGLOBIN (HGB A1C): Hemoglobin A1C: 6.2 % — AB (ref 4.0–5.6)

## 2019-07-14 NOTE — Addendum Note (Signed)
Addended by: Darliss Ridgel I on: 07/14/2019 04:37 PM   Modules accepted: Orders

## 2019-07-14 NOTE — Patient Instructions (Signed)
Please change Insulin Before breakfast Before lunch Before dinner  Regular (short, clear) 40  40 40 (if late snack: 20 units)  NPH (long, cloudy) 60 >> 50 50 40-60   - Metformin 1000 mg 2x a day  Please return in 4 months with your sugar log.
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
## 2019-07-19 ENCOUNTER — Ambulatory Visit: Payer: Self-pay

## 2019-07-20 NOTE — Chronic Care Management (AMB) (Signed)
Reviewed chart for medication changes ahead of medication coordination call.  07/14/2019- Endocrinology- Dr. Elvera Lennox- NPH dose decreased.   05/26/2019- Podiatry- Cody Blazek- No major interventions.   No medication changes indicated.   BP Readings from Last 3 Encounters:  07/14/19 118/80  03/17/19 130/70  02/04/19 (!) 150/68    Lab Results  Component Value Date   HGBA1C 6.2 (A) 07/14/2019     Patient obtains medications through Adherence Packaging  90 Days   Last adherence delivery included:  Filled 05/18/19 for 90DS  Amlodipine 10mg   Metformin 1000mg   Sertraline 100mg   Simvastatin 20mg   Valsartan/HCTZ 160/25mg   Aspirin 81mg   OneTouch verio Test strips - ~300 test strips    Patient is due for next adherence delivery on: 08/16/2019  Called patient and reviewed medications and coordinated delivery.  This delivery to include: BD insulin syringes 31G x 5/16" 1 ML  No refills needed.   Confirmed delivery date of 07/26/2019, advised patient that pharmacy will contact them the morning of delivery.  , PharmD Clinical Pharmacist [facility] [place] [phone]

## 2019-08-11 ENCOUNTER — Other Ambulatory Visit: Payer: Self-pay | Admitting: Internal Medicine

## 2019-08-17 ENCOUNTER — Telehealth: Payer: Self-pay

## 2019-08-17 NOTE — Progress Notes (Addendum)
Chronic Care Management Pharmacy Assistant   Name: Thor Nannini  MRN: [id number] DOB: 02-15-1950  Reason for Encounter: Medication Review  PCP : Shirline Frees, NP  Allergies:  No Known Allergies  Medications: Outpatient Encounter Medications as of 08/17/2019  Medication Sig   amLODipine (NORVASC) 10 MG tablet Take 1 tablet (10 mg total) by mouth daily.   aspirin EC 81 MG tablet Take 81 mg by mouth daily.   fexofenadine (ALLEGRA) 180 MG tablet Take 180 mg by mouth daily.   glucose blood (ONETOUCH VERIO) test strip USE AS DIRECTED 3 TIMES A DAY   insulin regular (NOVOLIN R RELION) 100 units/mL injection INJECT 60 UNITS SUBCUTANEOUSLY THREE TIMES DAILY   Insulin Syringe-Needle U-100 (BD INSULIN SYRINGE U/F) 31G X 5/16" 1 ML MISC USE 3 TIMES DAILY BEFORE MEALS   metFORMIN (GLUCOPHAGE) 1000 MG tablet TAKE 2 TABLETS BY MOUTH EVERY DAY WITH SUPPER (Patient taking differently: 1,000 mg 2 (two) times daily with a meal. )   NOVOLIN N RELION 100 UNIT/ML injection INJECT 40 UNITS SUBCUTANEOUSLY THREE TIMES DAILY   omeprazole (PRILOSEC) 20 MG capsule Take 1 capsule (20 mg total) by mouth daily.   sertraline (ZOLOFT) 100 MG tablet Take 1 tablet (100 mg total) by mouth daily.   simvastatin (ZOCOR) 20 MG tablet Take 1 tablet (20 mg total) by mouth daily.   valsartan-hydrochlorothiazide (DIOVAN-HCT) 160-25 MG tablet Take 1 tablet by mouth daily.   No facility-administered encounter medications on file as of 08/17/2019.    Current Diagnosis: Patient Active Problem List   Diagnosis Date Noted   Elevated TSH 04/19/2018   Type 2 diabetes mellitus with hyperglycemia, with long-term current use of insulin (HCC) 07/12/2015   Chronic right shoulder pain 05/08/2014   OSA (obstructive sleep apnea) 05/13/2013   Preventative health care 05/13/2013   Fatty liver 09/09/2012   BPV (benign positional vertigo) 09/09/2012   Nonspecific elevation of levels of transaminase or lactic acid dehydrogenase (LDH)  09/09/2012   OBESITY 09/10/2007   TESTOSTERONE DEFICIENCY 12/25/2006   Hyperlipidemia 12/25/2006   Depression, recurrent (HCC) 12/25/2006   Essential hypertension 12/25/2006    Goals Addressed   None     Follow-Up:  Coordination of Enhanced Pharmacy Services  Reviewed chart for medication changes ahead of medication coordination call.  No OVs, Consults, or hospital visits since last care coordination call.  No medication changes indicated OR if recent visit, treatment plan here.  BP Readings from Last 3 Encounters:  07/14/19 118/80  03/17/19 130/70  02/04/19 (!) 150/68    Lab Results  Component Value Date   HGBA1C 6.2 (A) 07/14/2019     Patient obtains medications through [facility]  Adherence Packaging  90 Days   Last adherence delivery included:  Amlodipine 10mg ; one tab at breakfast Metformin 1000mg ; one at breakfast Sertraline 100mg ; one tab with breakfast Simvastatin 20mg ; one tab with dinner Valsartan/ HCTZ 160/25mg ; one tab with breakfast Aspirin 81mg ; one tab with breakfast BD Insulin Syringes 31G x 5/16" 1 ML: Patient request BD brand OneTouch Verio test strips; use to check blood sugar three times daily  Patient is due for next adherence delivery on: 08-17-2019. Called patient and reviewed medications and coordinated delivery.  This delivery to include:  Amlodipine 10mg ; one tab at breakfast Metformin 1000mg ; one at breakfast Sertraline 100mg ; one tab with breakfast Simvastatin 20mg ; one tab with dinner Valsartan/ HCTZ 160/25mg ; one tab with breakfast Aspirin 81mg ; one tab with breakfast OneTouch Verio test strips; use to check blood  sugar three times daily   Patient declined the following medications: Omeprazole 20mg ; PRN will call when needed Insulin NPH (Novolin N): getting from [facility] since brand is more cost effective Insulin R (Novolin R): getting from [facility] since brand is more cost effective  Patient does not require refills.    Confirmed delivery date of 08-17-2019, advised patient that pharmacy will contact them the morning of delivery.    08-19-2019, PharmD Clinical Pharmacist [facility] [place] [phone]

## 2019-10-07 ENCOUNTER — Telehealth: Payer: Self-pay | Admitting: Pharmacist

## 2019-10-07 NOTE — Progress Notes (Signed)
Chronic Care Management Pharmacy Assistant   Name: Billy Hale  MRN: [id number] DOB: Nov 03, 1950  Reason for Encounter: Medication Review  Patient Questions:  1.  Have you seen any other providers since your last visit? No  2.  Any changes in your medicines or health? No    PCP : Shirline Frees, NP  Allergies:  No Known Allergies  Medications: Outpatient Encounter Medications as of 10/07/2019  Medication Sig  . amLODipine (NORVASC) 10 MG tablet Take 1 tablet (10 mg total) by mouth daily.  Marland Kitchen aspirin EC 81 MG tablet Take 81 mg by mouth daily.  . fexofenadine (ALLEGRA) 180 MG tablet Take 180 mg by mouth daily.  Marland Kitchen glucose blood (ONETOUCH VERIO) test strip USE AS DIRECTED 3 TIMES A DAY  . insulin regular (NOVOLIN R RELION) 100 units/mL injection INJECT 60 UNITS SUBCUTANEOUSLY THREE TIMES DAILY  . Insulin Syringe-Needle U-100 (BD INSULIN SYRINGE U/F) 31G X 5/16" 1 ML MISC USE 3 TIMES DAILY BEFORE MEALS  . metFORMIN (GLUCOPHAGE) 1000 MG tablet TAKE 2 TABLETS BY MOUTH EVERY DAY WITH SUPPER (Patient taking differently: 1,000 mg 2 (two) times daily with a meal. )  . NOVOLIN N RELION 100 UNIT/ML injection INJECT 40 UNITS SUBCUTANEOUSLY THREE TIMES DAILY  . omeprazole (PRILOSEC) 20 MG capsule Take 1 capsule (20 mg total) by mouth daily.  . sertraline (ZOLOFT) 100 MG tablet Take 1 tablet (100 mg total) by mouth daily.  . simvastatin (ZOCOR) 20 MG tablet Take 1 tablet (20 mg total) by mouth daily.  . valsartan-hydrochlorothiazide (DIOVAN-HCT) 160-25 MG tablet Take 1 tablet by mouth daily.   No facility-administered encounter medications on file as of 10/07/2019.    Current Diagnosis: Patient Active Problem List   Diagnosis Date Noted  . Elevated TSH 04/19/2018  . Type 2 diabetes mellitus with hyperglycemia, with long-term current use of insulin (HCC) 07/12/2015  . Chronic right shoulder pain 05/08/2014  . OSA (obstructive sleep apnea) 05/13/2013  . Preventative health care 05/13/2013    . Fatty liver 09/09/2012  . BPV (benign positional vertigo) 09/09/2012  . Nonspecific elevation of levels of transaminase or lactic acid dehydrogenase (LDH) 09/09/2012  . OBESITY 09/10/2007  . TESTOSTERONE DEFICIENCY 12/25/2006  . Hyperlipidemia 12/25/2006  . Depression, recurrent (HCC) 12/25/2006  . Essential hypertension 12/25/2006    Goals Addressed   None    Reviewed chart for medication changes ahead of medication coordination call. No OVs, Consults, or hospital visits since last care coordination call/Pharmacist visit.  No medication changes indicated  BP Readings from Last 3 Encounters:  07/14/19 118/80  03/17/19 130/70  02/04/19 (!) 150/68    Lab Results  Component Value Date   HGBA1C 6.2 (A) 07/14/2019     Patient obtains medications through Adherence Packaging  90 Days  Last adherence delivery included:  Marland Kitchen Amlodipine 10mg ; one tab at breakfast . Metformin 1000mg ; one at breakfast . Sertraline 100mg ; one tab with breakfast . Simvastatin 20mg ; one tab with dinner . Valsartan/ HCTZ 160/25mg ; one tab with breakfast . Aspirin 81mg ; one tab with breakfast . OneTouch Verio test strips; use to check blood sugar three times daily . BD Insulin Syringes 31G x 5/16" 1 ML: Patient request BD brand  I called the patient and review medications. There are no changes in medications currently. The patient is taking the following medications: . Amlodipine 10mg ; one tab at breakfast . Metformin 1000mg ; one at breakfast . Sertraline 100mg ; one tab with breakfast . Simvastatin 20mg ; one tab with dinner .  Valsartan/ HCTZ 160/25mg ; one tab with breakfast . Aspirin 81mg ; one tab with breakfast . OneTouch Verio test strips; use to check blood sugar three times daily . BD Insulin Syringes 31G x 5/16" 1 ML: Patient request BD brand . Omeprazole 20mg ; PRN will call when needed . Insulin NPH (Novolin N): getting from [facility] since brand is more cost effective . Insulin R (Novolin R):  getting from [facility] since brand is more cost effective  Patient does not need refills at this time. Confirmed delivery date of 11/17/2019, advised patient that pharmacy will contact them the morning of delivery.  Follow-Up:  Coordination of Enhanced Pharmacy Services  Amilia (Mimi) , 11/19/2019 Clinical Pharmacy Assistant [phone]

## 2019-10-10 ENCOUNTER — Encounter: Payer: Self-pay | Admitting: Internal Medicine

## 2019-11-05 ENCOUNTER — Telehealth: Payer: Self-pay | Admitting: Adult Health

## 2019-11-05 DIAGNOSIS — I10 Essential (primary) hypertension: Secondary | ICD-10-CM

## 2019-11-05 NOTE — Telephone Encounter (Signed)
I accidentally sent the refill for amlodipine to [facility].   I called and left a message for [facility] to delete the rx.   I can send a message to the patient to schedule an office visit with you.   Last saw you January of 2021. Pt has an appt to see the pharmacist on Monday 11/07/2019.   Please advise

## 2019-11-07 ENCOUNTER — Other Ambulatory Visit: Payer: Self-pay

## 2019-11-07 ENCOUNTER — Ambulatory Visit (INDEPENDENT_AMBULATORY_CARE_PROVIDER_SITE_OTHER): Payer: PPO

## 2019-11-07 DIAGNOSIS — Z0000 Encounter for general adult medical examination without abnormal findings: Secondary | ICD-10-CM | POA: Diagnosis not present
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
## 2019-11-07 NOTE — Patient Instructions (Signed)
Billy Hale , Thank you for taking time to come for your Medicare Wellness Visit. I appreciate your ongoing commitment to your health goals. Please review the following plan we discussed and let me know if I can assist you in the future.   Screening recommendations/referrals: Colonoscopy: Currently due, please let us know when you would like for [place] to get you scheduled  Recommended yearly ophthalmology/optometry visit for glaucoma screening and checkup Recommended yearly dental visit for hygiene and checkup  Vaccinations: Influenza vaccine: Currently due, you may receive this at your next office visit or at your local pharmacy Pneumococcal vaccine: Up to date, next due 02/04/2020  Tdap vaccine: Up to date, next due 11/18/2024 Shingles vaccine: Currently due for shingrix, you may receive this at your local pharmacy    Advanced directives: Advance directive discussed with you today. Even though you declined this today please call our office should you change your mind and we can give you the proper paperwork for you to fill out.   Conditions/risks identified: None   Next appointment: 11/15/2019 @ 11:00 am with the  Pharmacist   Preventive Care 65 Years and Older, Male Preventive care refers to lifestyle choices and visits with your health care provider that can promote health and wellness. What does preventive care include?  A yearly physical exam. This is also called an annual well check.  Dental exams once or twice a year.  Routine eye exams. Ask your health care provider how often you should have your eyes checked.  Personal lifestyle choices, including:  Daily care of your teeth and gums.  Regular physical activity.  Eating a healthy diet.  Avoiding tobacco and drug use.  Limiting alcohol use.  Practicing safe sex.  Taking low doses of aspirin every day.  Taking vitamin and mineral supplements as recommended by your health care provider. What happens during an annual  well check? The services and screenings done by your health care provider during your annual well check will depend on your age, overall health, lifestyle risk factors, and family history of disease. Counseling  Your health care provider may ask you questions about your:  Alcohol use.  Tobacco use.  Drug use.  Emotional well-being.  Home and relationship well-being.  Sexual activity.  Eating habits.  History of falls.  Memory and ability to understand (cognition).  Work and work Astronomer. Screening  You may have the following tests or measurements:  Height, weight, and BMI.  Blood pressure.  Lipid and cholesterol levels. These may be checked every 5 years, or more frequently if you are over 67 years old.  Skin check.  Lung cancer screening. You may have this screening every year starting at age 91 if you have a 30-pack-year history of smoking and currently smoke or have quit within the past 15 years.  Fecal occult blood test (FOBT) of the stool. You may have this test every year starting at age 19.  Flexible sigmoidoscopy or colonoscopy. You may have a sigmoidoscopy every 5 years or a colonoscopy every 10 years starting at age 22.  Prostate cancer screening. Recommendations will vary depending on your family history and other risks.  Hepatitis C blood test.  Hepatitis B blood test.  Sexually transmitted disease (STD) testing.  Diabetes screening. This is done by checking your blood sugar (glucose) after you have not eaten for a while (fasting). You may have this done every 1-3 years.  Abdominal aortic aneurysm (AAA) screening. You may need this if you are a  current or former smoker.  Osteoporosis. You may be screened starting at age 35 if you are at high risk. Talk with your health care provider about your test results, treatment options, and if necessary, the need for more tests. Vaccines  Your health care provider may recommend certain vaccines, such  as:  Influenza vaccine. This is recommended every year.  Tetanus, diphtheria, and acellular pertussis (Tdap, Td) vaccine. You may need a Td booster every 10 years.  Zoster vaccine. You may need this after age 80.  Pneumococcal 13-valent conjugate (PCV13) vaccine. One dose is recommended after age 56.  Pneumococcal polysaccharide (PPSV23) vaccine. One dose is recommended after age 22. Talk to your health care provider about which screenings and vaccines you need and how often you need them. This information is not intended to replace advice given to you by your health care provider. Make sure you discuss any questions you have with your health care provider. Document Released: 02/02/2015 Document Revised: 09/26/2015 Document Reviewed: 11/07/2014 Elsevier Interactive Patient Education  2017 [employer] Prevention in the Home Falls can cause injuries. They can happen to people of all ages. There are many things you can do to make your home safe and to help prevent falls. What can I do on the outside of my home?  Regularly fix the edges of walkways and driveways and fix any cracks.  Remove anything that might make you trip as you walk through a door, such as a raised step or threshold.  Trim any bushes or trees on the path to your home.  Use bright outdoor lighting.  Clear any walking paths of anything that might make someone trip, such as rocks or tools.  Regularly check to see if handrails are loose or broken. Make sure that both sides of any steps have handrails.  Any raised decks and porches should have guardrails on the edges.  Have any leaves, snow, or ice cleared regularly.  Use sand or salt on walking paths during winter.  Clean up any spills in your garage right away. This includes oil or grease spills. What can I do in the bathroom?  Use night lights.  Install grab bars by the toilet and in the tub and shower. Do not use towel bars as grab bars.  Use  non-skid mats or decals in the tub or shower.  If you need to sit down in the shower, use a plastic, non-slip stool.  Keep the floor dry. Clean up any water that spills on the floor as soon as it happens.  Remove soap buildup in the tub or shower regularly.  Attach bath mats securely with double-sided non-slip rug tape.  Do not have throw rugs and other things on the floor that can make you trip. What can I do in the bedroom?  Use night lights.  Make sure that you have a light by your bed that is easy to reach.  Do not use any sheets or blankets that are too big for your bed. They should not hang down onto the floor.  Have a firm chair that has side arms. You can use this for support while you get dressed.  Do not have throw rugs and other things on the floor that can make you trip. What can I do in the kitchen?  Clean up any spills right away.  Avoid walking on wet floors.  Keep items that you use a lot in easy-to-reach places.  If you need to reach something above  you, use a strong step stool that has a grab bar.  Keep electrical cords out of the way.  Do not use floor polish or wax that makes floors slippery. If you must use wax, use non-skid floor wax.  Do not have throw rugs and other things on the floor that can make you trip. What can I do with my stairs?  Do not leave any items on the stairs.  Make sure that there are handrails on both sides of the stairs and use them. Fix handrails that are broken or loose. Make sure that handrails are as long as the stairways.  Check any carpeting to make sure that it is firmly attached to the stairs. Fix any carpet that is loose or worn.  Avoid having throw rugs at the top or bottom of the stairs. If you do have throw rugs, attach them to the floor with carpet tape.  Make sure that you have a light switch at the top of the stairs and the bottom of the stairs. If you do not have them, ask someone to add them for you. What  else can I do to help prevent falls?  Wear shoes that:  Do not have high heels.  Have rubber bottoms.  Are comfortable and fit you well.  Are closed at the toe. Do not wear sandals.  If you use a stepladder:  Make sure that it is fully opened. Do not climb a closed stepladder.  Make sure that both sides of the stepladder are locked into place.  Ask someone to hold it for you, if possible.  Clearly mark and make sure that you can see:  Any grab bars or handrails.  First and last steps.  Where the edge of each step is.  Use tools that help you move around (mobility aids) if they are needed. These include:  Canes.  Walkers.  Scooters.  Crutches.  Turn on the lights when you go into a dark area. Replace any light bulbs as soon as they burn out.  Set up your furniture so you have a clear path. Avoid moving your furniture around.  If any of your floors are uneven, fix them.  If there are any pets around you, be aware of where they are.  Review your medicines with your doctor. Some medicines can make you feel dizzy. This can increase your chance of falling. Ask your doctor what other things that you can do to help prevent falls. This information is not intended to replace advice given to you by your health care provider. Make sure you discuss any questions you have with your health care provider. Document Released: 11/02/2008 Document Revised: 06/14/2015 Document Reviewed: 02/10/2014 Elsevier Interactive Patient Education  2017 [employer].

## 2019-11-08 ENCOUNTER — Encounter: Payer: Self-pay | Admitting: Internal Medicine

## 2019-11-08 ENCOUNTER — Other Ambulatory Visit: Payer: Self-pay

## 2019-11-08 ENCOUNTER — Ambulatory Visit: Payer: PPO | Admitting: Internal Medicine

## 2019-11-08 VITALS — BP 162/80 | HR 94 | Ht 68.0 in | Wt 314.6 lb

## 2019-11-08 DIAGNOSIS — Z794 Long term (current) use of insulin: Secondary | ICD-10-CM

## 2019-11-08 DIAGNOSIS — E669 Obesity, unspecified: Secondary | ICD-10-CM | POA: Diagnosis not present

## 2019-11-08 DIAGNOSIS — E1165 Type 2 diabetes mellitus with hyperglycemia: Secondary | ICD-10-CM | POA: Diagnosis not present

## 2019-11-08 DIAGNOSIS — E782 Mixed hyperlipidemia: Secondary | ICD-10-CM | POA: Diagnosis not present

## 2019-11-08 LAB — POCT GLYCOSYLATED HEMOGLOBIN (HGB A1C): Hemoglobin A1C: 6.4 % — AB (ref 4.0–5.6)

## 2019-11-08 MED ORDER — AMLODIPINE BESYLATE 10 MG PO TABS: 10.0000 mg | ORAL_TABLET | Freq: Every morning | ORAL | 3 refills | Status: DC

## 2019-11-08 NOTE — Patient Instructions (Addendum)
Please continue: Insulin Before breakfast Before lunch Before dinner  Regular (short, clear) 40 (20 on fasting days) 40 (0 on fasting days) 40 (0-20 on fasting days)  NPH (long, cloudy) 50 (25 on fasting days) 50 (0 on fasting days) 40-60 (25 on fasting days)   - Metformin 1000 mg 2x a day  Please return in 4 months with your sugar log.

## 2019-11-08 NOTE — Telephone Encounter (Signed)
That is ok, I do not need to see him until jan 2022
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
## 2019-11-08 NOTE — Addendum Note (Signed)
Addended by: Darene Lamer T on: 11/08/2019 04:57 PM   Modules accepted: Orders

## 2019-11-08 NOTE — Addendum Note (Signed)
Addended by: Verlan Friends on: 11/08/2019 08:19 AM   Modules accepted: Orders

## 2019-11-14 ENCOUNTER — Telehealth: Payer: Self-pay | Admitting: Pharmacist

## 2019-11-14 DIAGNOSIS — Z794 Long term (current) use of insulin: Secondary | ICD-10-CM

## 2019-11-14 DIAGNOSIS — E1165 Type 2 diabetes mellitus with hyperglycemia: Secondary | ICD-10-CM

## 2019-11-14 DIAGNOSIS — I10 Essential (primary) hypertension: Secondary | ICD-10-CM

## 2019-11-14 NOTE — Chronic Care Management (AMB) (Addendum)
Chronic Care Management Pharmacy Assistant   Name: Billy Hale  MRN: [id number] DOB: 08-Sep-1950  Reason for Encounter: Medication Review  Patient Questions:  1.  Have you seen any other providers since your last visit? Yes, 11-08-2019 [name], MD  2.  Any changes in your medicines or health? No   PCP : Shirline Frees, NP  Allergies:  No Known Allergies  Medications: Outpatient Encounter Medications as of 11/14/2019  Medication Sig   amLODipine (NORVASC) 10 MG tablet Take 1 tablet (10 mg total) by mouth every morning.   aspirin EC 81 MG tablet Take 81 mg by mouth daily.   fexofenadine (ALLEGRA) 180 MG tablet Take 180 mg by mouth daily.   glucose blood (ONETOUCH VERIO) test strip USE AS DIRECTED 3 TIMES A DAY   insulin regular (NOVOLIN R RELION) 100 units/mL injection INJECT 60 UNITS SUBCUTANEOUSLY THREE TIMES DAILY   Insulin Syringe-Needle U-100 (BD INSULIN SYRINGE U/F) 31G X 5/16" 1 ML MISC USE 3 TIMES DAILY BEFORE MEALS   metFORMIN (GLUCOPHAGE) 1000 MG tablet TAKE 2 TABLETS BY MOUTH EVERY DAY WITH SUPPER (Patient taking differently: 1,000 mg 2 (two) times daily with a meal. )   NOVOLIN N RELION 100 UNIT/ML injection INJECT 40 UNITS SUBCUTANEOUSLY THREE TIMES DAILY   omeprazole (PRILOSEC) 20 MG capsule Take 1 capsule (20 mg total) by mouth daily. (Patient taking differently: Take 20 mg by mouth as needed. )   sertraline (ZOLOFT) 100 MG tablet Take 1 tablet (100 mg total) by mouth daily.   simvastatin (ZOCOR) 20 MG tablet Take 1 tablet (20 mg total) by mouth daily.   valsartan-hydrochlorothiazide (DIOVAN-HCT) 160-25 MG tablet Take 1 tablet by mouth daily.   No facility-administered encounter medications on file as of 11/14/2019.    Current Diagnosis: Patient Active Problem List   Diagnosis Date Noted   Elevated TSH 04/19/2018   Type 2 diabetes mellitus with hyperglycemia, with long-term current use of insulin (HCC) 07/12/2015   Chronic right  shoulder pain 05/08/2014   OSA (obstructive sleep apnea) 05/13/2013   Preventative health care 05/13/2013   Fatty liver 09/09/2012   BPV (benign positional vertigo) 09/09/2012   Nonspecific elevation of levels of transaminase or lactic acid dehydrogenase (LDH) 09/09/2012   OBESITY 09/10/2007   TESTOSTERONE DEFICIENCY 12/25/2006   Hyperlipidemia 12/25/2006   Depression, recurrent (HCC) 12/25/2006   Essential hypertension 12/25/2006    Goals Addressed   None    Reviewed chart for medication changes ahead of medication coordination call. Reviewed  OVs, Consults, or hospital visits since last care coordination call/Pharmacist visit.  No medication changes indicated.  BP Readings from Last 3 Encounters:  11/08/19 (!) 162/80  07/14/19 118/80  03/17/19 130/70    Lab Results  Component Value Date   HGBA1C 6.4 (A) 11/08/2019     Patient obtains medications through Adherence Packaging  90 Days   Last adherence delivery included:  Amlodipine 10 mg; one tab at breakfast  Metformin 1000 mg; one at breakfast  Sertraline 100 mg; one tab with breakfast  Simvastatin 20 mg; one tab with dinner  Valsartan/ HCTZ 160/25 mg; one tab with breakfast  Aspirin 81mg ; one tab with breakfast  OneTouch Verio test strips; use to check blood sugar three times daily  BD Insulin Syringes 31 G x 5/16 1 ML: Patient request BD brand  Patient is due for next adherence delivery on: 11-17-2019 Called patient and reviewed medications and coordinated delivery.  This delivery to include:  Amlodipine 10 mg; one  tablet at breakfast  Metformin 1000 mg; two tablet at dinner  Sertraline 100 mg; one tablet with breakfast  Simvastatin 20 mg; one tablet with dinner  Valsartan/ HCTZ 160/25 mg; one tab with breakfast  Aspirin 81mg ; one tablet with breakfast  OneTouch Verio test strips; use to check blood sugar three times daily  BD Insulin Syringes 31 G x 5/16 1 ML: Patient requests BD  brand  Omeprazole 20 mg;one capsule at breakfast  Allegra 180 mg; one tablet at breakfast  Patient declined the following medications   Insulin NPH (Novolin N): getting from [facility] since brand is more cost effective  Insulin R (Novolin R): getting from [facility] since brand is more cost effective  Patient does not need any refills at this time.  Confirmed delivery date of 11-17-2019, advised patient that pharmacy will contact them the morning of delivery.  Follow-Up:  Coordination of Enhanced Pharmacy Services   Amilia (Mimi) 11-19-2019, [facility] Clinical Pharmacy Assistant [phone]

## 2019-11-15 ENCOUNTER — Telehealth: Payer: PPO

## 2019-11-30 ENCOUNTER — Telehealth: Payer: Self-pay | Admitting: Pharmacist

## 2019-11-30 NOTE — Chronic Care Management (AMB) (Signed)
Chronic Care Management Pharmacy Assistant   Name: Billy Hale  MRN: [id number] DOB: March 27, 1950  Reason for Encounter: Medication Review  Patient Questions:  1.  Have you seen any other providers since your last visit? No  2.  Any changes in your medicines or health? No    PCP : Shirline Frees, NP  Allergies:  No Known Allergies  Medications: Outpatient Encounter Medications as of 11/30/2019  Medication Sig   amLODipine (NORVASC) 10 MG tablet Take 1 tablet (10 mg total) by mouth every morning.   aspirin EC 81 MG tablet Take 81 mg by mouth daily.   fexofenadine (ALLEGRA) 180 MG tablet Take 180 mg by mouth daily.   glucose blood (ONETOUCH VERIO) test strip USE AS DIRECTED 3 TIMES A DAY   insulin regular (NOVOLIN R RELION) 100 units/mL injection INJECT 60 UNITS SUBCUTANEOUSLY THREE TIMES DAILY   Insulin Syringe-Needle U-100 (BD INSULIN SYRINGE U/F) 31G X 5/16" 1 ML MISC USE 3 TIMES DAILY BEFORE MEALS   metFORMIN (GLUCOPHAGE) 1000 MG tablet TAKE 2 TABLETS BY MOUTH EVERY DAY WITH SUPPER (Patient taking differently: 1,000 mg 2 (two) times daily with a meal. )   NOVOLIN N RELION 100 UNIT/ML injection INJECT 40 UNITS SUBCUTANEOUSLY THREE TIMES DAILY   omeprazole (PRILOSEC) 20 MG capsule Take 1 capsule (20 mg total) by mouth daily. (Patient taking differently: Take 20 mg by mouth as needed. )   sertraline (ZOLOFT) 100 MG tablet Take 1 tablet (100 mg total) by mouth daily.   simvastatin (ZOCOR) 20 MG tablet Take 1 tablet (20 mg total) by mouth daily.   valsartan-hydrochlorothiazide (DIOVAN-HCT) 160-25 MG tablet Take 1 tablet by mouth daily.   No facility-administered encounter medications on file as of 11/30/2019.    Current Diagnosis: Patient Active Problem List   Diagnosis Date Noted   Elevated TSH 04/19/2018   Type 2 diabetes mellitus with hyperglycemia, with long-term current use of insulin (HCC) 07/12/2015   Chronic right shoulder pain 05/08/2014   OSA  (obstructive sleep apnea) 05/13/2013   Preventative health care 05/13/2013   Fatty liver 09/09/2012   BPV (benign positional vertigo) 09/09/2012   Nonspecific elevation of levels of transaminase or lactic acid dehydrogenase (LDH) 09/09/2012   OBESITY 09/10/2007   TESTOSTERONE DEFICIENCY 12/25/2006   Hyperlipidemia 12/25/2006   Depression, recurrent (HCC) 12/25/2006   Essential hypertension 12/25/2006    Goals Addressed   None    Reviewed chart for medication changes ahead of medication coordination call. No OVs, Consults, or hospital visits since last care coordination call/Pharmacist visit.  No medication changes indicated OR if recent visit, treatment plan here.  BP Readings from Last 3 Encounters:  11/08/19 (!) 162/80  07/14/19 118/80  03/17/19 130/70    Lab Results  Component Value Date   HGBA1C 6.4 (A) 11/08/2019     Patient obtains medications through Adherence Packaging  90 Days   Last adherence delivery included:  Amlodipine 10 mg; one tablet at breakfast  Metformin 1000 mg; two tablets at dinner  Sertraline 100 mg; one tablet with breakfast  Simvastatin 20 mg; one tablet with dinner  Valsartan/ HCTZ 160/25 mg; one tab with breakfast  Aspirin 81 mg; one tablet with breakfast  OneTouch Verio test strips; use to check blood sugar three times daily  BD Insulin Syringes 31 G x 5/16 1 ML: Patient requests BD brand  Omeprazole 20 mg;one capsule at breakfast  Allegra 180 mg; one tablet at breakfast  I spoke with the patient and review  medications. There are no changes in medications currently. The patient is taking the following medications:  Amlodipine 10 mg; one tablet at breakfast  Metformin 1000 mg; two tablets at dinner  Sertraline 100 mg; one tablet with breakfast  Simvastatin 20 mg; one tablet with dinner  Valsartan/ HCTZ 160/25 mg; one tab with breakfast  Aspirin 81 mg; one tablet with breakfast  OneTouch Verio test strips; use to  check blood sugar three times daily  BD Insulin Syringes 31 G x 5/16 1 ML: Patient requests BD brand  Omeprazole 20 mg;one capsule at breakfast  Allegra 180 mg; one tablet at breakfast  Insulin NPH (Novolin N): getting from [facility] since brand is more cost effective  Insulin R (Novolin R): getting from [facility] since brand is more cost effective  Patient declined the medications due to supply on hand.The patient does not need refills currently.  His next confirmed delivery date of 02-14-2020, advised patient that pharmacy will contact them the morning of delivery. Follow-Up:  Pharmacist Review    I spoke with patient wife today and follow-up appointment was made.  Berenice Bouton, [facility] Clinical Pharmacy Assistant [phone]

## 2019-12-06 ENCOUNTER — Telehealth: Payer: Self-pay | Admitting: Pharmacist

## 2019-12-06 NOTE — Progress Notes (Signed)
Chronic Care Management Pharmacy Assistant   Name: Billy Hale  MRN: [id number] DOB: Mar 12, 1950  Reason for Encounter: Medication Review  Patient Questions:  1.  Have you seen any other providers since your last visit? No  2.  Any changes in your medicines or health? No   PCP : Shirline Frees, NP  Allergies:  No Known Allergies  Medications: Outpatient Encounter Medications as of 12/06/2019  Medication Sig  . amLODipine (NORVASC) 10 MG tablet Take 1 tablet (10 mg total) by mouth every morning.  Marland Kitchen aspirin EC 81 MG tablet Take 81 mg by mouth daily.  . fexofenadine (ALLEGRA) 180 MG tablet Take 180 mg by mouth daily.  Marland Kitchen glucose blood (ONETOUCH VERIO) test strip USE AS DIRECTED 3 TIMES A DAY  . insulin regular (NOVOLIN R RELION) 100 units/mL injection INJECT 60 UNITS SUBCUTANEOUSLY THREE TIMES DAILY  . Insulin Syringe-Needle U-100 (BD INSULIN SYRINGE U/F) 31G X 5/16" 1 ML MISC USE 3 TIMES DAILY BEFORE MEALS  . metFORMIN (GLUCOPHAGE) 1000 MG tablet TAKE 2 TABLETS BY MOUTH EVERY DAY WITH SUPPER (Patient taking differently: 1,000 mg 2 (two) times daily with a meal. )  . NOVOLIN N RELION 100 UNIT/ML injection INJECT 40 UNITS SUBCUTANEOUSLY THREE TIMES DAILY  . omeprazole (PRILOSEC) 20 MG capsule Take 1 capsule (20 mg total) by mouth daily. (Patient taking differently: Take 20 mg by mouth as needed. )  . sertraline (ZOLOFT) 100 MG tablet Take 1 tablet (100 mg total) by mouth daily.  . simvastatin (ZOCOR) 20 MG tablet Take 1 tablet (20 mg total) by mouth daily.  . valsartan-hydrochlorothiazide (DIOVAN-HCT) 160-25 MG tablet Take 1 tablet by mouth daily.   No facility-administered encounter medications on file as of 12/06/2019.    Current Diagnosis: Patient Active Problem List   Diagnosis Date Noted  . Elevated TSH 04/19/2018  . Type 2 diabetes mellitus with hyperglycemia, with long-term current use of insulin (HCC) 07/12/2015  . Chronic right shoulder pain 05/08/2014  . OSA  (obstructive sleep apnea) 05/13/2013  . Preventative health care 05/13/2013  . Fatty liver 09/09/2012  . BPV (benign positional vertigo) 09/09/2012  . Nonspecific elevation of levels of transaminase or lactic acid dehydrogenase (LDH) 09/09/2012  . OBESITY 09/10/2007  . TESTOSTERONE DEFICIENCY 12/25/2006  . Hyperlipidemia 12/25/2006  . Depression, recurrent (HCC) 12/25/2006  . Essential hypertension 12/25/2006    Goals Addressed   None     Follow-Up:  Coordination of Enhanced Pharmacy Services   Reviewed chart for medication changes ahead of medication coordination call.  No OVs, Consults, or hospital visits since last care coordination call/Pharmacist visit. (If appropriate, list visit date, provider name)  No medication changes indicated OR if recent visit, treatment plan here.  BP Readings from Last 3 Encounters:  11/08/19 (!) 162/80  07/14/19 118/80  03/17/19 130/70    Lab Results  Component Value Date   HGBA1C 6.4 (A) 11/08/2019     Patient obtains medications through Adherence Packaging  90 Days   Last adherence delivery included:   Onetouch Verio Test Strips  Allegra Allergy 180mg  Aspirin 81mg   Amlodipine 10mg   Omeprazole 20mg  Metformin 1000mg   Insulin Syringe U-100 Valsartan 160- HCTZ 25mg   Sertraline 100mg   Simvastatin 20mg    Patient is due for next adherence delivery on: 02/14/2020 Called patient and reviewed medications and coordinated delivery.   Patient declined the following medications   Onetouch Verio Test Strips  Allegra Allergy 180mg  Aspirin 81mg   Amlodipine 10mg   Omeprazole 20mg  Metformin 1000mg   Insulin  Syringe U-100 Valsartan 160- HCTZ 25mg   Sertraline 100mg   Simvastatin 20mg    The patient received a 90-day delivery on 11/16/19 supply ends on 02/18/2020  , [facility]  Practice Team Manager/ CPA (Clinical Pharmacist Assistant) [phone]

## 2019-12-09 ENCOUNTER — Encounter: Payer: Self-pay | Admitting: Internal Medicine

## 2019-12-22 ENCOUNTER — Encounter: Payer: Self-pay | Admitting: Internal Medicine

## 2019-12-22 ENCOUNTER — Telehealth: Payer: Self-pay | Admitting: Pharmacist

## 2019-12-22 NOTE — Chronic Care Management (AMB) (Signed)
I left the patient a message about his upcoming appointment on 12-23-2019 @ 2:00with the clinical pharmacist. He was asked to please have all medication on hand to review the pharmacist.  Tresa Garter) [facility], [facility] Clinical Pharmacy Assistant [phone]

## 2019-12-23 ENCOUNTER — Ambulatory Visit: Payer: PPO | Admitting: Pharmacist

## 2019-12-23 DIAGNOSIS — E1165 Type 2 diabetes mellitus with hyperglycemia: Secondary | ICD-10-CM

## 2019-12-23 DIAGNOSIS — I10 Essential (primary) hypertension: Secondary | ICD-10-CM

## 2019-12-23 NOTE — Chronic Care Management (AMB) (Signed)
Chronic Care Management Pharmacy  Name: Larnce Schnackenberg  MRN: [id number] DOB: 1950/06/15  Initial Questions: 1. Have you seen any other providers since your last visit? Na 2. Any changes in your medicines or health? No   Chief Complaint/ HPI Rowin Bayron,  69 y.o. , male presents for their Follow-Up CCM visit with the clinical pharmacist via telephone due to COVID-19 Pandemic.  PCP : Shirline Frees, NP  Their chronic conditions include: DM, HTN, HLD, Depression, GERD   Office Visits: 02/04/2019- Patient presented for office visit with Shirline Frees, NP for annual exam. Patient encouraged to lose weight. Labs ordered: CBC, CMP, lipid panel, TSH, T3, T4. No changed to current regimen.   Consult Visit: 11/08/19 Belva Crome, MD (endocrinology): Patient presented for DM follow up. Recommended using half doses of regular NPH insulin for days he is not eating.  03/17/2019- Endocrinology- Patient presented for office visit with Dr. Belva Crome, MD for follow up. No changes on DM regimen. Patient to continue insulin regular, 40 units before breakfast, lunch, and dinner and 20 units with late snack AND insulin NPH, 60 units before breakfast, 50 units before lunch, and 40-60 units before dinner AND metformin 1000mg  BID. Patient to continue Zocor. Patient advised to stay off milk. Patient to return in 3 to 4 months.   01/03/2019- Pulmonology- Patient presented for office visit with Dr. 01/05/2019, MD to establish care for OSA. Patient to obtain new machine when his current one runs out. Patient encouraged for weight loss.   Medications: Outpatient Encounter Medications as of 12/23/2019  Medication Sig  . amLODipine (NORVASC) 10 MG tablet Take 1 tablet (10 mg total) by mouth every morning.  14/03/2019 aspirin EC 81 MG tablet Take 81 mg by mouth daily.  . fexofenadine (ALLEGRA) 180 MG tablet Take 180 mg by mouth daily.  Marland Kitchen glucose blood (ONETOUCH VERIO) test strip USE AS DIRECTED 3 TIMES A DAY  .  insulin regular (NOVOLIN R RELION) 100 units/mL injection INJECT 60 UNITS SUBCUTANEOUSLY THREE TIMES DAILY  . Insulin Syringe-Needle U-100 (BD INSULIN SYRINGE U/F) 31G X 5/16" 1 ML MISC USE 3 TIMES DAILY BEFORE MEALS  . metFORMIN (GLUCOPHAGE) 1000 MG tablet TAKE 2 TABLETS BY MOUTH EVERY DAY WITH SUPPER (Patient taking differently: 1,000 mg 2 (two) times daily with a meal. )  . NOVOLIN N RELION 100 UNIT/ML injection INJECT 40 UNITS SUBCUTANEOUSLY THREE TIMES DAILY  . omeprazole (PRILOSEC) 20 MG capsule Take 1 capsule (20 mg total) by mouth daily. (Patient taking differently: Take 20 mg by mouth as needed. )  . sertraline (ZOLOFT) 100 MG tablet Take 1 tablet (100 mg total) by mouth daily.  . simvastatin (ZOCOR) 20 MG tablet Take 1 tablet (20 mg total) by mouth daily.  . valsartan-hydrochlorothiazide (DIOVAN-HCT) 160-25 MG tablet Take 1 tablet by mouth daily.   No facility-administered encounter medications on file as of 12/23/2019.   Spoke with wife as patient prefers to not do telephonic visits.  Current Diagnosis/Assessment:  Goals Addressed            This Visit's Progress   . Pharmacy Care Plan       CARE PLAN ENTRY  Current Barriers:  . Chronic Disease Management support, education, and care coordination needs related to Hypertension, Hyperlipidemia, Diabetes, Gastroesophageal Reflux Disease, and Depression   Hypertension . Pharmacist Clinical Goal(s): o Over the next 180 days, patient will work with PharmD and providers to maintain BP goal < 140/90 . Current regimen:   Amlodipine 10mg ,  1 tablet once daily   Valsartan-hydrochlorothiazide 160-25mg , 1 tablet once daily  . Interventions: o We discussed:  . DASH diet:  following a diet emphasizing fruits and vegetables and low-fat dairy products along with whole grains, fish, poultry, and nuts. Reducing red meats and sugars. . Exercising  . Reducing the amount of salt intake to 1500mg /per day.  . Patient self care activities -  Over the next 180 days, patient will: o Check blood pressure at home a few times a month o Ensure DASH diet, exercise regimen, and daily salt intake < 1500 mg/day  Hyperlipidemia . Pharmacist Clinical Goal(s): o Over the next 180 days, patient will work with PharmD and providers to maintain LDL goal < 100 . Current regimen:  o Simvastatin 20mg , 1 tablet once daily . Interventions: . How to reduce cholesterol through diet/weight management and physical activity.    . We discussed how a diet high in plant sterols (fruits/vegetables/nuts/whole grains/legumes) may reduce your cholesterol.  Encouraged increasing fiber to a daily intake of 10-25g/day . Patient self care activities - Over the next 180 days, patient will: . Engage in at least 150 minutes per week of moderate-intensity exercise such as brisk walking (15- to 20-minute mile) or something similar. Recommended they incorporate flexibility, balance, and some type of strength training exercises.   Diabetes . Pharmacist Clinical Goal(s): o Over the next 180 days, patient will work with PharmD and providers to maintain A1c goal < 7.0% . Current regimen:   Insulin NPH, 50 units before breakfast (25 on fasting days), 50 units before lunch (0 on fasting days), and 40-60 units before dinner (25 on fasting days)  Metformin 1000mg , 1 tablet twice daily   Insulin R, inject 40 units three times daily (on fasting days 20 at breakfast, 0 at lunch and 0-20 at dinner) . Patient self care activities - Over the next 180 days, patient will: o Checking blood sugar, document, and provide at future appointments o Contact provider with any episodes of hypoglycemia  Depression . Pharmacist Clinical Goal(s) o Over the next 180 days, patient will continue current regimen and monitor symptoms of depression.  . Current regimen:  o Sertraline 100mg , 1 tablet once daily   GERD . Pharmacist Clinical Goal(s) o Over the next 180 days, patient will continue  current regimen as needed and monitor symptoms of acid reflux  . Current regimen:  o Omeprazole 20mg , 1 capsule once daily . Interventions: o We discussed:  non-pharmacological interventions for acid reflux. Take measures to prevent acid reflux, such as avoiding spicy foods, avoiding caffeine, avoid laying down a few hours after eating, and raising the head of the bed   Medication management . Pharmacist Clinical Goal(s): o Over the next 180 days, patient will work with PharmD and providers to maintain optimal medication adherence . Current pharmacy: [facility] . Interventions o Comprehensive medication review performed. o Utilize [facility] pharmacy for medication synchronization, packaging and delivery . Patient self care activities - Over the next 180 days, patient will: o Take medications as prescribed o Report any questions or concerns to PharmD and/or provider(s)  Please see past updates related to this goal by clicking on the "Past Updates" button in the selected goal          Diabetes  A1c goal < 7%  Recent Relevant Labs: Lab Results  Component Value Date/Time   HGBA1C 6.4 (A) 11/08/2019 04:56 PM   HGBA1C 6.2 (A) 07/14/2019 04:37 PM   HGBA1C 8.6 (H) 01/23/2017 09:46  AM   HGBA1C 8.1 (H) 04/20/2015 09:02 AM   MICROALBUR 61.9 (H) 01/06/2018 09:21 AM   MICROALBUR 43.0 (H) 04/20/2015 09:02 AM    Patient has failed these meds in past: Invokana, pioglitazone, Lantus  2 times a day:   12/2 BB 119 BD 61  12/1 AB 88 AL 74 11/30 BB 185 AB 101 11/29 BB 202 BD 86 11/28 BB 207 AD 107   Patient is currently controlled on the following medications:   Insulin NPH, 50 units before breakfast (25 on fasting days), 50 units before lunch (0 on fasting days), and 40-60 units before dinner (25 on fasting days)  Metformin 1000mg , 1 tablet twice daily   Insulin R, inject 40 units three times daily (on fasting days 20 at breakfast, 0 at lunch and 0-20 at dinner)  Last  diabetic Eye exam:  Lab Results  Component Value Date/Time   HMDIABEYEEXA No Retinopathy 06/10/2017 12:00 AM  - patient reported has plans to obtain this year.    Last diabetic Foot exam:  Lab Results  Component Value Date/Time   HMDIABFOOTEX done 11/08/2010 12:00 AM   We discussed: benefits of CGM based on current insulin regimen and variable BG testing -Discussed symptoms and management of hypoglycemia  Plan Managed by Dr. 11/10/2010 (endo) Continue current medications   Hypertension   BP goal is:  <140/90  Office blood pressures are  BP Readings from Last 3 Encounters:  11/08/19 (!) 162/80  07/14/19 118/80  03/17/19 130/70   Patient has failed these meds in the past: benazepril, losartan  Patient checks BP at home: has not been checking at home since he reports BP has been okay at provider's visits   Patient is controlled on:   Amlodipine 10mg , 1 tablet once daily   Valsartan-hydrochlorothiazide 160-25mg , 1 tablet once daily   We discussed diet and exercise extensively . DASH diet:  following a diet emphasizing fruits and vegetables and low-fat dairy products along with whole grains, fish, poultry, and nuts. Reducing red meats and sugars. . Exercising  . Reducing the amount of salt intake to 1500mg /per day.  . Recommend using a salt substitute to replace your salt if you need flavor.      -Discussed the importance of checking blood pressure at home  Plan Continue current medications.    Hyperlipidemia  LDL goal < 100  Lipid Panel     Component Value Date/Time   CHOL 125 02/04/2019 0943   TRIG 193.0 (H) 02/04/2019 0943   HDL 38.90 (L) 02/04/2019 0943   CHOLHDL 3 02/04/2019 0943   VLDL 38.6 02/04/2019 0943   LDLCALC 48 02/04/2019 0943   LDLDIRECT 72.9 11/01/2010 0801    The ASCVD Risk score (Goff DC Jr., et al., 2013) failed to calculate for the following reasons:   The valid total cholesterol range is 130 to 320 mg/dL   Patient has failed  these meds in past: fenofibrate  Patient is currently uncontrolled (triglycerides and HDL) on the following medications:   Simvastatin 20mg , 1 tablet once daily   We discussed:  diet and exercise extensively  Plan Continue current medications and control with diet and exercise  Depression  Patient has failed these meds in past: none  Patient is currently controlled on the following medications:   Sertraline 100mg , 1 tablet once daily   Plan Continue current medications.   GERD  Patient has failed these meds in past: none  Patient is currently controlled on the following medications:   Omeprazole  20mg , 1 capsule once daily (patient reported not taking everyday).   We discussed:  non-pharmacological interventions for acid reflux. Take measures to prevent acid reflux, such as avoiding spicy foods, avoiding caffeine, avoid laying down a few hours after eating, and raising the head of the bed.  Plan Continue current medications   Vaccines   Reviewed and discussed patient's vaccination history.    Immunization History  Administered Date(s) Administered  . Fluad Quad(high Dose 65+) 11/10/2018  . Influenza,inj,Quad PF,6+ Mos 11/16/2012  . PFIZER SARS-COV-2 Vaccination 04/28/2019, 05/23/2019  . Pneumococcal Polysaccharide-23 08/30/2008, 02/04/2019  . Td 04/20/2004  . Tdap 11/19/2014  . Zoster 07/10/2010    Plan  Recommended patient receive influenza and Shingles vaccine in office/at pharmacy.    Medication Management   Patient's preferred pharmacy is:  [facility] - [place], [place] - [address] [place] Phone: [phone] Fax: [phone]  [facility] [address] - [place] [address] [place] [address] [place] Phone: [phone] Fax: [phone]  Uses pill box? No - adherence packagining Pt endorses 100% compliance  We discussed: Discussed  benefits of medication synchronization, packaging and delivery as well as enhanced pharmacist oversight with [facility].  Plan  Utilize [facility] pharmacy for medication synchronization, packaging and delivery   Follow up: 6 month phone visit  [phone], PharmD [facility] Clinical Pharmacist [facility] [place] [phone]

## 2020-01-03 ENCOUNTER — Encounter: Payer: Self-pay | Admitting: Pharmacist

## 2020-01-04 NOTE — Chronic Care Management (AMB) (Signed)
ERROR

## 2020-01-05 ENCOUNTER — Encounter: Payer: Self-pay | Admitting: Internal Medicine

## 2020-01-08 NOTE — Patient Instructions (Signed)
Hi Billy Hale and Billy Hale,  It was lovely to get to speak with you over the phone again! As we discussed, I think it would be useful to discuss with Dr. Elvera Lennox about trying a continuous glucose monitoring product such as Dexcom or Freestyle Libre and I did send her a message as well. Also, I do think it would be beneficial to check your blood pressure every once in a while to make sure your medications are working properly.  Please give me a call if you have any questions or need anything before our follow up in 6 months!  Best, Maddie  [name], PharmD [facility] Clinical Pharmacist [facility] [place] [phone]   Visit Information  Goals Addressed            This Visit's Progress   . Pharmacy Care Plan       CARE PLAN ENTRY  Current Barriers:  . Chronic Disease Management support, education, and care coordination needs related to Hypertension, Hyperlipidemia, Diabetes, Gastroesophageal Reflux Disease, and Depression   Hypertension . Pharmacist Clinical Goal(s): o Over the next 180 days, patient will work with PharmD and providers to maintain BP goal < 140/90 . Current regimen:   Amlodipine 10mg , 1 tablet once daily   Valsartan-hydrochlorothiazide 160-25mg , 1 tablet once daily  . Interventions: o We discussed:  . DASH diet:  following a diet emphasizing fruits and vegetables and low-fat dairy products along with whole grains, fish, poultry, and nuts. Reducing red meats and sugars. . Exercising  . Reducing the amount of salt intake to 1500mg /per day.  . Patient self care activities - Over the next 180 days, patient will: o Check blood pressure at home a few times a month o Ensure DASH diet, exercise regimen, and daily salt intake < 1500 mg/day  Hyperlipidemia . Pharmacist Clinical Goal(s): o Over the next 180 days, patient will work with PharmD and providers to maintain LDL goal < 100 . Current regimen:  o Simvastatin 20mg , 1 tablet once  daily . Interventions: . How to reduce cholesterol through diet/weight management and physical activity.    . We discussed how a diet high in plant sterols (fruits/vegetables/nuts/whole grains/legumes) may reduce your cholesterol.  Encouraged increasing fiber to a daily intake of 10-25g/day . Patient self care activities - Over the next 180 days, patient will: . Engage in at least 150 minutes per week of moderate-intensity exercise such as brisk walking (15- to 20-minute mile) or something similar. Recommended they incorporate flexibility, balance, and some type of strength training exercises.   Diabetes . Pharmacist Clinical Goal(s): o Over the next 180 days, patient will work with PharmD and providers to maintain A1c goal < 7.0% . Current regimen:   Insulin NPH, 50 units before breakfast (25 on fasting days), 50 units before lunch (0 on fasting days), and 40-60 units before dinner (25 on fasting days)  Metformin 1000mg , 1 tablet twice daily   Insulin R, inject 40 units three times daily (on fasting days 20 at breakfast, 0 at lunch and 0-20 at dinner) . Patient self care activities - Over the next 180 days, patient will: o Checking blood sugar, document, and provide at future appointments o Contact provider with any episodes of hypoglycemia  Depression . Pharmacist Clinical Goal(s) o Over the next 180 days, patient will continue current regimen and monitor symptoms of depression.  . Current regimen:  o Sertraline 100mg , 1 tablet once daily   GERD . Pharmacist Clinical Goal(s) o Over the next 180 days,  patient will continue current regimen as needed and monitor symptoms of acid reflux  . Current regimen:  o Omeprazole 20mg , 1 capsule once daily . Interventions: o We discussed:  non-pharmacological interventions for acid reflux. Take measures to prevent acid reflux, such as avoiding spicy foods, avoiding caffeine, avoid laying down a few hours after eating, and raising the head of the  bed   Medication management . Pharmacist Clinical Goal(s): o Over the next 180 days, patient will work with PharmD and providers to maintain optimal medication adherence . Current pharmacy: [facility] . Interventions o Comprehensive medication review performed. o Utilize [facility] pharmacy for medication synchronization, packaging and delivery . Patient self care activities - Over the next 180 days, patient will: o Take medications as prescribed o Report any questions or concerns to PharmD and/or provider(s)  Please see past updates related to this goal by clicking on the "Past Updates" button in the selected goal         The patient verbalized understanding of instructions, educational materials, and care plan provided today and declined offer to receive copy of patient instructions, educational materials, and care plan.   Telephone follow up appointment with pharmacy team member scheduled for: 6 months  , [place]

## 2020-01-20 ENCOUNTER — Other Ambulatory Visit: Payer: Self-pay | Admitting: Internal Medicine

## 2020-01-30 ENCOUNTER — Other Ambulatory Visit: Payer: Self-pay | Admitting: Internal Medicine

## 2020-01-30 MED ORDER — FREESTYLE LIBRE 2 SENSOR MISC: 1.0000 | 3 refills | Status: DC

## 2020-01-30 MED ORDER — FREESTYLE LIBRE 2 READER DEVI: 1.0000 | Freq: Every day | 0 refills | Status: DC

## 2020-02-01 ENCOUNTER — Telehealth: Payer: Self-pay

## 2020-02-02 ENCOUNTER — Telehealth: Payer: Self-pay | Admitting: Pharmacist

## 2020-02-02 NOTE — Chronic Care Management (AMB) (Signed)
Chronic Care Management Pharmacy Assistant   Name: Billy Hale  MRN: [id number] DOB: 1950/06/30  Reason for Encounter: Medication Review  PCP : Dorothyann Peng, NP  Allergies:  No Known Allergies  Medications: Outpatient Encounter Medications as of 02/02/2020  Medication Sig  . amLODipine (NORVASC) 10 MG tablet Take 1 tablet (10 mg total) by mouth every morning.  Marland Kitchen aspirin EC 81 MG tablet Take 81 mg by mouth daily.  . Continuous Blood Gluc Receiver (FREESTYLE LIBRE 2 READER) DEVI 1 each by Does not apply route daily.  . Continuous Blood Gluc Sensor (FREESTYLE LIBRE 2 SENSOR) MISC 1 each by Does not apply route every 14 (fourteen) days.  . fexofenadine (ALLEGRA) 180 MG tablet Take 180 mg by mouth daily.  Marland Kitchen glucose blood (ONETOUCH VERIO) test strip USE AS DIRECTED 3 TIMES A DAY  . Insulin Syringe-Needle U-100 (BD INSULIN SYRINGE U/F) 31G X 5/16" 1 ML MISC USE 3 TIMES DAILY BEFORE MEALS  . metFORMIN (GLUCOPHAGE) 1000 MG tablet TAKE 2 TABLETS BY MOUTH EVERY DAY WITH SUPPER (Patient taking differently: 1,000 mg 2 (two) times daily with a meal. )  . NOVOLIN N RELION 100 UNIT/ML injection INJECT 40 UNITS SUBCUTANEOUSLY THREE TIMES DAILY  . NOVOLIN R RELION 100 UNIT/ML injection INJECT 60 UNITS SUBCUTANEOUSLY THREE TIMES DAILY  . omeprazole (PRILOSEC) 20 MG capsule Take 1 capsule (20 mg total) by mouth daily. (Patient taking differently: Take 20 mg by mouth as needed. )  . sertraline (ZOLOFT) 100 MG tablet Take 1 tablet (100 mg total) by mouth daily.  . simvastatin (ZOCOR) 20 MG tablet Take 1 tablet (20 mg total) by mouth daily.  . valsartan-hydrochlorothiazide (DIOVAN-HCT) 160-25 MG tablet Take 1 tablet by mouth daily.   No facility-administered encounter medications on file as of 02/02/2020.    Current Diagnosis: Patient Active Problem List   Diagnosis Date Noted  . Elevated TSH 04/19/2018  . Type 2 diabetes mellitus with hyperglycemia, with long-term current use of insulin ([place])  07/12/2015  . Chronic right shoulder pain 05/08/2014  . OSA (obstructive sleep apnea) 05/13/2013  . Preventative health care 05/13/2013  . Fatty liver 09/09/2012  . BPV (benign positional vertigo) 09/09/2012  . Nonspecific elevation of levels of transaminase or lactic acid dehydrogenase (LDH) 09/09/2012  . OBESITY 09/10/2007  . TESTOSTERONE DEFICIENCY 12/25/2006  . Hyperlipidemia 12/25/2006  . Depression, recurrent ([place]) 12/25/2006  . Essential hypertension 12/25/2006    Goals Addressed   None    Reviewed chart for medication changes ahead of medication coordination call. No OVs, Consults, or hospital visits since last care coordination call/Pharmacist visit.  No medication changes indicated   BP Readings from Last 3 Encounters:  11/08/19 (!) 162/80  07/14/19 118/80  03/17/19 130/70    Lab Results  Component Value Date   HGBA1C 6.4 (A) 11/08/2019     Patient obtains medications through Adherence Packaging  90 Days  Last adherence delivery included:  Marland Kitchen Amlodipine 10 mg; one tablets at breakfast . Metformin 1000 mg; two tablets at dinner . Sertraline 100 mg; one tablets with breakfast . Simvastatin 20 mg; one tablets with dinner . Valsartan/ HCTZ 160/25 mg; one tablets with breakfast . Aspirin 81 mg; one tablets with breakfast . BD Insulin Syringes 31 G x 5/16" 1 ML: Patient request BD brand . OneTouch Verio test strips; use to check blood sugar three times daily . Omeprazole 20 mg; PRN will call when needed . Insulin NPH (Novolin N): getting from [facility] since brand is  more cost effective . Insulin R (Novolin R): getting from [facility] since brand is more cost effective  Patient declined the following medications last month due to PRN use/additional supply on hand. . Amlodipine 10 mg; one tablets at breakfast . Metformin 1000 mg; two tablets at dinner . Sertraline 100 mg; one tablets with breakfast . Simvastatin 20 mg; one tablets with dinner . Valsartan/ HCTZ 160/25  mg; one tablets with breakfast . Aspirin 81 mg; one tablets with breakfast . BD Insulin Syringes 31 G x 5/16" 1 ML: Patient request BD brand . OneTouch Verio test strips; use to check blood sugar three times daily . Omeprazole 20 mg; PRN will call when needed . Insulin NPH (Novolin N): getting from [facility] since brand is more cost effective . Insulin R (Novolin R): getting from [facility] since brand is more cost effective  Patient is due for next adherence delivery on: 02/10/2020. Called patient and reviewed medications and coordinated delivery. This delivery to include: . Amlodipine 10 mg; one tablets at breakfast . Metformin 1000 mg; two tablets at dinner . Sertraline 100 mg; one tablets with breakfast . Simvastatin 20 mg; one tablets with dinner . Valsartan/ HCTZ 160/25 mg; one tablets with breakfast . Aspirin 81 mg; one tablets with breakfast . BD Insulin Syringes 31 G x 5/16" 1 ML: Patient request BD brand . OneTouch Verio test strips; use to check blood sugar three times daily . Omeprazole 20 mg; One capsule  At breakfast . Freestyle Libra kit Sensor: Check blood sugar as directed . Allegra Allergy 180 mg : one tablet at breakfast  Patient declined the following medications  . Insulin NPH (Novolin N): getting from [facility] since brand is more cost effective . Insulin R (Novolin R): getting from [facility] since brand is more cost effective  Patient needs refills for: Marland Kitchen Sertraline 100 mg.  . Simvastatin 20 mg.  . Valsartan/ HCTZ 160/25 mg.    Confirmed delivery date of 02/10/2020, advised patient that pharmacy will contact them the morning of delivery. Follow-Up:  Care Coordination with Outside Provider and Pharmacist Review   Maia Breslow, [place] Assistant [phone]

## 2020-02-03 ENCOUNTER — Other Ambulatory Visit: Payer: Self-pay | Admitting: Adult Health

## 2020-02-03 ENCOUNTER — Encounter: Payer: Self-pay | Admitting: Internal Medicine

## 2020-02-03 DIAGNOSIS — F339 Major depressive disorder, recurrent, unspecified: Secondary | ICD-10-CM

## 2020-02-03 DIAGNOSIS — I10 Essential (primary) hypertension: Secondary | ICD-10-CM

## 2020-02-03 MED ORDER — SIMVASTATIN 20 MG PO TABS: 20.0000 mg | ORAL_TABLET | Freq: Every day | ORAL | 0 refills | Status: DC

## 2020-02-03 MED ORDER — VALSARTAN-HYDROCHLOROTHIAZIDE 160-25 MG PO TABS: 1.0000 | ORAL_TABLET | Freq: Every day | ORAL | 0 refills | Status: DC

## 2020-02-03 MED ORDER — SERTRALINE HCL 100 MG PO TABS: 100.0000 mg | ORAL_TABLET | Freq: Every day | ORAL | 0 refills | Status: DC

## 2020-02-08 ENCOUNTER — Encounter: Payer: PPO | Admitting: Adult Health

## 2020-02-24 ENCOUNTER — Other Ambulatory Visit: Payer: Self-pay

## 2020-02-24 ENCOUNTER — Ambulatory Visit (INDEPENDENT_AMBULATORY_CARE_PROVIDER_SITE_OTHER): Payer: PPO | Admitting: Adult Health

## 2020-02-24 ENCOUNTER — Encounter: Payer: Self-pay | Admitting: Adult Health

## 2020-02-24 VITALS — BP 130/70 | Temp 99.1°F | Ht 68.5 in | Wt 316.0 lb

## 2020-02-24 DIAGNOSIS — Z125 Encounter for screening for malignant neoplasm of prostate: Secondary | ICD-10-CM

## 2020-02-24 DIAGNOSIS — Z0000 Encounter for general adult medical examination without abnormal findings: Secondary | ICD-10-CM | POA: Diagnosis not present

## 2020-02-24 DIAGNOSIS — Z794 Long term (current) use of insulin: Secondary | ICD-10-CM

## 2020-02-24 DIAGNOSIS — E1165 Type 2 diabetes mellitus with hyperglycemia: Secondary | ICD-10-CM | POA: Diagnosis not present

## 2020-02-24 DIAGNOSIS — G4733 Obstructive sleep apnea (adult) (pediatric): Secondary | ICD-10-CM

## 2020-02-24 DIAGNOSIS — F339 Major depressive disorder, recurrent, unspecified: Secondary | ICD-10-CM

## 2020-02-24 DIAGNOSIS — I10 Essential (primary) hypertension: Secondary | ICD-10-CM

## 2020-02-24 DIAGNOSIS — E782 Mixed hyperlipidemia: Secondary | ICD-10-CM

## 2020-02-24 LAB — CBC WITH DIFFERENTIAL/PLATELET
Basophils Absolute: 0 10*3/uL (ref 0.0–0.1)
Basophils Relative: 0.3 % (ref 0.0–3.0)
Eosinophils Absolute: 0.1 10*3/uL (ref 0.0–0.7)
Eosinophils Relative: 2.4 % (ref 0.0–5.0)
HCT: 41.5 % (ref 39.0–52.0)
Hemoglobin: 14.2 g/dL (ref 13.0–17.0)
Lymphocytes Relative: 23.2 % (ref 12.0–46.0)
Lymphs Abs: 1.1 10*3/uL (ref 0.7–4.0)
MCHC: 34.1 g/dL (ref 30.0–36.0)
MCV: 97.5 fl (ref 78.0–100.0)
Monocytes Absolute: 0.4 10*3/uL (ref 0.1–1.0)
Monocytes Relative: 8.4 % (ref 3.0–12.0)
Neutro Abs: 3.1 10*3/uL (ref 1.4–7.7)
Neutrophils Relative %: 65.7 % (ref 43.0–77.0)
Platelets: 85 10*3/uL — ABNORMAL LOW (ref 150.0–400.0)
RBC: 4.26 Mil/uL (ref 4.22–5.81)
RDW: 14.1 % (ref 11.5–15.5)
WBC: 4.8 10*3/uL (ref 4.0–10.5)

## 2020-02-24 LAB — COMPREHENSIVE METABOLIC PANEL
ALT: 34 U/L (ref 0–53)
AST: 26 U/L (ref 0–37)
Albumin: 4.1 g/dL (ref 3.5–5.2)
Alkaline Phosphatase: 121 U/L — ABNORMAL HIGH (ref 39–117)
BUN: 18 mg/dL (ref 6–23)
CO2: 25 mEq/L (ref 19–32)
Calcium: 9.4 mg/dL (ref 8.4–10.5)
Chloride: 104 mEq/L (ref 96–112)
Creatinine, Ser: 0.74 mg/dL (ref 0.40–1.50)
GFR: 91.91 mL/min (ref >60.00)
Glucose, Bld: 110 mg/dL — ABNORMAL HIGH (ref 70–99)
Potassium: 3.7 mEq/L (ref 3.5–5.1)
Sodium: 139 mEq/L (ref 135–145)
Total Bilirubin: 0.5 mg/dL (ref 0.2–1.2)
Total Protein: 7.5 g/dL (ref 6.0–8.3)

## 2020-02-24 LAB — LIPID PANEL
Cholesterol: 132 mg/dL (ref 0–200)
HDL: 45.2 mg/dL (ref >39.00)
LDL Cholesterol: 63 mg/dL (ref 0–99)
NonHDL: 87.16
Total CHOL/HDL Ratio: 3
Triglycerides: 119 mg/dL (ref 0.0–149.0)
VLDL: 23.8 mg/dL (ref 0.0–40.0)

## 2020-02-24 LAB — TSH: TSH: 3.64 u[IU]/mL (ref 0.35–4.50)

## 2020-02-24 LAB — PSA: PSA: 0.86 ng/mL (ref 0.10–4.00)
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
## 2020-02-27 ENCOUNTER — Other Ambulatory Visit: Payer: Self-pay | Admitting: Adult Health

## 2020-02-27 DIAGNOSIS — R899 Unspecified abnormal finding in specimens from other organs, systems and tissues: Secondary | ICD-10-CM

## 2020-02-27 NOTE — Progress Notes (Signed)
Per last lab results order for CBC placed per PCP/thx dmf

## 2020-03-06 ENCOUNTER — Telehealth: Payer: Self-pay | Admitting: Pharmacist

## 2020-03-06 NOTE — Chronic Care Management (AMB) (Signed)
Chronic Care Management Pharmacy Assistant   Name: Priest Lockridge  MRN: [id number] DOB: Mar 14, 1950  Reason for Encounter: Medication Review  PCP : Dorothyann Peng, NP  Allergies:  No Known Allergies  Medications: Outpatient Encounter Medications as of 03/06/2020  Medication Sig   amLODipine (NORVASC) 10 MG tablet Take 1 tablet (10 mg total) by mouth every morning.   aspirin EC 81 MG tablet Take 81 mg by mouth daily.   Continuous Blood Gluc Sensor (FREESTYLE LIBRE 2 SENSOR) MISC 1 each by Does not apply route every 14 (fourteen) days.   fexofenadine (ALLEGRA) 180 MG tablet Take 180 mg by mouth daily.   glucose blood (ONETOUCH VERIO) test strip USE AS DIRECTED 3 TIMES A DAY   Insulin Syringe-Needle U-100 (BD INSULIN SYRINGE U/F) 31G X 5/16" 1 ML MISC USE 3 TIMES DAILY BEFORE MEALS   metFORMIN (GLUCOPHAGE) 1000 MG tablet TAKE 2 TABLETS BY MOUTH EVERY DAY WITH SUPPER (Patient taking differently: 1,000 mg 2 (two) times daily with a meal.)   NOVOLIN N RELION 100 UNIT/ML injection INJECT 40 UNITS SUBCUTANEOUSLY THREE TIMES DAILY   NOVOLIN R RELION 100 UNIT/ML injection INJECT 60 UNITS SUBCUTANEOUSLY THREE TIMES DAILY   omeprazole (PRILOSEC) 20 MG capsule Take 1 capsule (20 mg total) by mouth daily. (Patient taking differently: Take 20 mg by mouth as needed.)   sertraline (ZOLOFT) 100 MG tablet Take 1 tablet (100 mg total) by mouth daily.   simvastatin (ZOCOR) 20 MG tablet Take 1 tablet (20 mg total) by mouth daily.   valsartan-hydrochlorothiazide (DIOVAN-HCT) 160-25 MG tablet Take 1 tablet by mouth daily.   No facility-administered encounter medications on file as of 03/06/2020.    Current Diagnosis: Patient Active Problem List   Diagnosis Date Noted   Elevated TSH 04/19/2018   Type 2 diabetes mellitus with hyperglycemia, with long-term current use of insulin ([place]) 07/12/2015   Chronic right shoulder pain 05/08/2014   OSA (obstructive sleep apnea) 05/13/2013    Preventative health care 05/13/2013   Fatty liver 09/09/2012   BPV (benign positional vertigo) 09/09/2012   Nonspecific elevation of levels of transaminase or lactic acid dehydrogenase (LDH) 09/09/2012   OBESITY 09/10/2007   TESTOSTERONE DEFICIENCY 12/25/2006   Hyperlipidemia 12/25/2006   Depression, recurrent ([place]) 12/25/2006   Essential hypertension 12/25/2006    Goals Addressed   None    Reviewed chart for medication changes ahead of medication coordination call. Reviewed OVs, Consults, or hospital visits since last care coordination call/Pharmacist visit.   Office Visit 02-24-2020 Dorothyann Peng, NP Family Medicine  Reviewed medication changes indicated.  BP Readings from Last 3 Encounters:  02/24/20 130/70  11/08/19 (!) 162/80  07/14/19 118/80    Lab Results  Component Value Date   HGBA1C 6.4 (A) 11/08/2019     Patient obtains medications through Adherence Packaging  90 Days  Last adherence delivery included:   Amlodipine 10 mg; one tablets at breakfast  Metformin 1000 mg; two tablets at dinner  Sertraline 100 mg; one tablets with breakfast  Simvastatin 20 mg; one tablets with dinner  Valsartan/ HCTZ 160/25 mg; one tablets with breakfast  Aspirin 81 mg; one tablets with breakfast  BD Insulin Syringes 31 G x 5/16 1 ML: Patient request BD brand  OneTouch Verio test strips; use to check blood sugar three times daily  Omeprazole 20 mg; one capsule at breakfast  Allegra Allergy 180 mg: one tablet at breakfast  Freestyle Libra kit Sensor: Check blood sugar as directed  Patient declined the following  medication last month due to additional supply on hand.  Insulin NPH (Novolin N): getting from [facility] since brand is more cost effective  Insulin R (Novolin R): getting from [facility] since brand is more cost effective  I spoke with the patient and review medications. There are no changes in medications currently. Patient declined these medication this  month due to PRN use/additional supply on hand.The patient is taking the following medications:  Amlodipine 10 mg; one tablets at breakfast  Metformin 1000 mg; two tablets at dinner  Sertraline 100 mg; one tablets with breakfast  Simvastatin 20 mg; one tablets with dinner  Valsartan/ HCTZ 160/25 mg; one tablets with breakfast  Aspirin 81 mg; one tablets with breakfast  BD Insulin Syringes 31 G x 5/16 1 ML: Patient request BD brand  OneTouch Verio test strips; use to check blood sugar three times daily  Omeprazole 20 mg; one capsule at breakfast  Allegra Allergy 180 mg: one tablet at breakfast  Freestyle Libra kit Sensor: Check blood sugar as directed  Insulin NPH (Novolin N): getting from [place] since brand is more cost effective  Insulin R (Novolin R): getting from [facility] since brand is more cost effective   He currently does not needed refills.  Confirmed delivery date of 05/10/2020, advised patient that pharmacy will contact them the morning of delivery.  Follow-Up:  Coordination of Enhanced Pharmacy Services and Pharmacist Review  Maia Breslow, [place] Assistant [phone]

## 2020-03-09 ENCOUNTER — Encounter: Payer: Self-pay | Admitting: Internal Medicine

## 2020-03-09 ENCOUNTER — Other Ambulatory Visit: Payer: Self-pay

## 2020-03-09 ENCOUNTER — Other Ambulatory Visit (INDEPENDENT_AMBULATORY_CARE_PROVIDER_SITE_OTHER): Payer: PPO

## 2020-03-09 DIAGNOSIS — R899 Unspecified abnormal finding in specimens from other organs, systems and tissues: Secondary | ICD-10-CM

## 2020-03-09 LAB — CBC WITH DIFFERENTIAL/PLATELET
Basophils Absolute: 0 10*3/uL (ref 0.0–0.1)
Basophils Relative: 0.4 % (ref 0.0–3.0)
Eosinophils Absolute: 0.2 10*3/uL (ref 0.0–0.7)
Eosinophils Relative: 3.2 % (ref 0.0–5.0)
HCT: 41.6 % (ref 39.0–52.0)
Hemoglobin: 14.2 g/dL (ref 13.0–17.0)
Lymphocytes Relative: 25.2 % (ref 12.0–46.0)
Lymphs Abs: 1.3 10*3/uL (ref 0.7–4.0)
MCHC: 34.2 g/dL (ref 30.0–36.0)
MCV: 99 fl (ref 78.0–100.0)
Monocytes Absolute: 0.4 10*3/uL (ref 0.1–1.0)
Monocytes Relative: 7.6 % (ref 3.0–12.0)
Neutro Abs: 3.2 10*3/uL (ref 1.4–7.7)
Neutrophils Relative %: 63.6 % (ref 43.0–77.0)
Platelets: 89 10*3/uL — ABNORMAL LOW (ref 150.0–400.0)
RBC: 4.2 Mil/uL — ABNORMAL LOW (ref 4.22–5.81)
RDW: 14.2 % (ref 11.5–15.5)
WBC: 5.1 10*3/uL (ref 4.0–10.5)

## 2020-03-13 DIAGNOSIS — Z1212 Encounter for screening for malignant neoplasm of rectum: Secondary | ICD-10-CM | POA: Diagnosis not present

## 2020-03-13 DIAGNOSIS — Z1211 Encounter for screening for malignant neoplasm of colon: Secondary | ICD-10-CM | POA: Diagnosis not present

## 2020-03-13 LAB — COLOGUARD: Cologuard: NEGATIVE

## 2020-03-14 ENCOUNTER — Other Ambulatory Visit: Payer: Self-pay | Admitting: Adult Health

## 2020-03-14 DIAGNOSIS — D696 Thrombocytopenia, unspecified: Secondary | ICD-10-CM

## 2020-03-15 ENCOUNTER — Ambulatory Visit: Payer: PPO | Admitting: Internal Medicine

## 2020-03-15 ENCOUNTER — Other Ambulatory Visit: Payer: Self-pay

## 2020-03-15 ENCOUNTER — Encounter: Payer: Self-pay | Admitting: Internal Medicine

## 2020-03-15 VITALS — BP 130/80 | HR 97 | Ht 68.5 in | Wt 316.4 lb

## 2020-03-15 DIAGNOSIS — E038 Other specified hypothyroidism: Secondary | ICD-10-CM | POA: Diagnosis not present

## 2020-03-15 DIAGNOSIS — E669 Obesity, unspecified: Secondary | ICD-10-CM | POA: Diagnosis not present

## 2020-03-15 DIAGNOSIS — E782 Mixed hyperlipidemia: Secondary | ICD-10-CM

## 2020-03-15 DIAGNOSIS — E1165 Type 2 diabetes mellitus with hyperglycemia: Secondary | ICD-10-CM

## 2020-03-15 DIAGNOSIS — Z794 Long term (current) use of insulin: Secondary | ICD-10-CM | POA: Diagnosis not present

## 2020-03-15 LAB — POCT GLYCOSYLATED HEMOGLOBIN (HGB A1C): Hemoglobin A1C: 6.4 % — AB (ref 4.0–5.6)
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
## 2020-03-15 NOTE — Patient Instructions (Addendum)
Please continue: - Metformin 1000 mg 2x a day Insulin Before breakfast Before lunch Before dinner  Regular (short, clear) 20-40 20-40 20-40  NPH (long, cloudy) 30-50 30-50 40-60   Please start Ozempic 0.25 mg weekly in a.m. (for example on Sunday morning) x 4 weeks, then increase to 0.5 mg weekly in a.m. if no nausea or hypoglycemia.  Please return in 4 months with your sugar log.

## 2020-03-21 LAB — COLOGUARD: COLOGUARD: NEGATIVE

## 2020-03-26 ENCOUNTER — Encounter: Payer: Self-pay | Admitting: Adult Health

## 2020-04-04 ENCOUNTER — Telehealth: Payer: Self-pay | Admitting: Pharmacist

## 2020-04-04 NOTE — Chronic Care Management (AMB) (Addendum)
Chronic Care Management Pharmacy Assistant   Name: Billy Hale  MRN: [id number] DOB: Jan 04, 1951  Reason for Encounter: Medication Review   Recent office visits:  None  Recent consult visits:  Office Visit 03-15-2020 Philemon Kingdom, MD Internal Medicine  Hospital visits:  None in previous 6 months  Medications: Outpatient Encounter Medications as of 04/04/2020  Medication Sig   amLODipine (NORVASC) 10 MG tablet Take 1 tablet (10 mg total) by mouth every morning.   aspirin EC 81 MG tablet Take 81 mg by mouth daily.   Continuous Blood Gluc Sensor (FREESTYLE LIBRE 2 SENSOR) MISC 1 each by Does not apply route every 14 (fourteen) days.   fexofenadine (ALLEGRA) 180 MG tablet Take 180 mg by mouth daily.   glucose blood (ONETOUCH VERIO) test strip USE AS DIRECTED 3 TIMES A DAY   Insulin Syringe-Needle U-100 (BD INSULIN SYRINGE U/F) 31G X 5/16" 1 ML MISC USE 3 TIMES DAILY BEFORE MEALS   metFORMIN (GLUCOPHAGE) 1000 MG tablet TAKE 2 TABLETS BY MOUTH EVERY DAY WITH SUPPER (Patient taking differently: 1,000 mg 2 (two) times daily with a meal.)   NOVOLIN N RELION 100 UNIT/ML injection INJECT 40 UNITS SUBCUTANEOUSLY THREE TIMES DAILY   NOVOLIN R RELION 100 UNIT/ML injection INJECT 60 UNITS SUBCUTANEOUSLY THREE TIMES DAILY   omeprazole (PRILOSEC) 20 MG capsule Take 1 capsule (20 mg total) by mouth daily. (Patient taking differently: Take 20 mg by mouth as needed.)   sertraline (ZOLOFT) 100 MG tablet Take 1 tablet (100 mg total) by mouth daily.   simvastatin (ZOCOR) 20 MG tablet Take 1 tablet (20 mg total) by mouth daily.   valsartan-hydrochlorothiazide (DIOVAN-HCT) 160-25 MG tablet Take 1 tablet by mouth daily.   No facility-administered encounter medications on file as of 04/04/2020.   Reviewed chart for medication changes ahead of medication coordination call.  No OVs, Consults, or hospital visits since last care coordination call/Pharmacist visit. (If appropriate, list visit date,  provider name)  No medication changes indicated OR if recent visit, treatment plan here.  BP Readings from Last 3 Encounters:  03/15/20 130/80  02/24/20 130/70  11/08/19 (!) 162/80    Lab Results  Component Value Date   HGBA1C 6.4 (A) 03/15/2020     Patient obtains medications through Adherence Packaging  90 Days  Last adherence delivery included: Amlodipine 10 mg; one tablet at breakfast Metformin 1000 mg; two tablets at dinner Sertraline 100 mg; one tablet with breakfast Simvastatin 20 mg; one tablet with dinner Valsartan/ HCTZ 160/25 mg; one tablet with breakfast Aspirin 81 mg; one tablets with breakfast BD Insulin Syringes 31 G x 5/16 1 ML: Patient request BD brand OneTouch Verio test strips; use to check blood sugar three times daily Omeprazole 20 mg; one capsule at breakfast Allegra Allergy 180 mg: one tablet at breakfast Freestyle Libre kit Sensor: Check blood sugar as directed Insulin NPH (Novolin N): getting from [place] since brand is more cost effective Insulin R (Novolin R): getting from [place] since brand is more cost effective   I spoke with the patient and review medications. There are no changes in medications currently. Patient declined these medication this month due to PRN use/additional supply on hand.The patient is taking the following medications: Patient received 90 day supply on 02/09/2020 Amlodipine 10 mg; one tablet at breakfast Metformin 1000 mg; two tablets at dinner Sertraline 100 mg; one tablet with breakfast Simvastatin 20 mg; one tablet with dinner Valsartan/ HCTZ 160/25 mg; one tablet with breakfast Aspirin 81 mg; one  tabletswith breakfast BD Insulin Syringes 31 G x 5/16 1 ML: Patient request BD brand OneTouch Verio test strips; use to check blood sugar three times daily Omeprazole 20 mg; one capsule at breakfast Allegra Allergy 180 mg: one tablet at breakfast Freestyle Libre kit Sensor: Check blood sugar as directed Insulin NPH (Novolin  N): 40 units daily getting from [place] since brand is more cost effective Insulin R (Novolin R): 60 units daily getting from [place] since brand is more cost effective   He currently does not need refill.  Star Rating Drugs:  Dispensed Quantity Pharmacy  Simvastatin 20 mg 01.20.2022 [facility]  Amlodipine 10 mg 01.20.2022 [facility]  Metformin 1,000 mg 01.20.2022 [facility]   Amilia Revonda Standard, [place] Assistant [phone]

## 2020-04-09 ENCOUNTER — Encounter: Payer: Self-pay | Admitting: Internal Medicine

## 2020-04-11 ENCOUNTER — Other Ambulatory Visit: Payer: Self-pay

## 2020-04-11 ENCOUNTER — Other Ambulatory Visit (INDEPENDENT_AMBULATORY_CARE_PROVIDER_SITE_OTHER): Payer: PPO

## 2020-04-11 DIAGNOSIS — D696 Thrombocytopenia, unspecified: Secondary | ICD-10-CM

## 2020-04-11 LAB — CBC WITH DIFFERENTIAL/PLATELET
Basophils Absolute: 0 10*3/uL (ref 0.0–0.1)
Basophils Relative: 0.4 % (ref 0.0–3.0)
Eosinophils Absolute: 0.2 10*3/uL (ref 0.0–0.7)
Eosinophils Relative: 2.9 % (ref 0.0–5.0)
HCT: 40.1 % (ref 39.0–52.0)
Hemoglobin: 14.1 g/dL (ref 13.0–17.0)
Lymphocytes Relative: 23.5 % (ref 12.0–46.0)
Lymphs Abs: 1.3 10*3/uL (ref 0.7–4.0)
MCHC: 35.1 g/dL (ref 30.0–36.0)
MCV: 96.9 fl (ref 78.0–100.0)
Monocytes Absolute: 0.4 10*3/uL (ref 0.1–1.0)
Monocytes Relative: 7.8 % (ref 3.0–12.0)
Neutro Abs: 3.5 10*3/uL (ref 1.4–7.7)
Neutrophils Relative %: 65.4 % (ref 43.0–77.0)
Platelets: 85 10*3/uL — ABNORMAL LOW (ref 150.0–400.0)
RBC: 4.14 Mil/uL — ABNORMAL LOW (ref 4.22–5.81)
RDW: 13.5 % (ref 11.5–15.5)
WBC: 5.4 10*3/uL (ref 4.0–10.5)

## 2020-04-12 ENCOUNTER — Other Ambulatory Visit: Payer: Self-pay | Admitting: Adult Health

## 2020-04-12 DIAGNOSIS — D696 Thrombocytopenia, unspecified: Secondary | ICD-10-CM

## 2020-04-17 ENCOUNTER — Telehealth: Payer: Self-pay | Admitting: *Deleted

## 2020-04-17 NOTE — Telephone Encounter (Signed)
Per referral Dr. Emi Holes - lvm of upcoming appointments - mailed calendar with welcome packet

## 2020-04-29 ENCOUNTER — Other Ambulatory Visit: Payer: Self-pay | Admitting: Family

## 2020-04-29 DIAGNOSIS — D696 Thrombocytopenia, unspecified: Secondary | ICD-10-CM

## 2020-04-30 ENCOUNTER — Inpatient Hospital Stay: Payer: PPO | Attending: Family

## 2020-04-30 ENCOUNTER — Telehealth: Payer: Self-pay | Admitting: Pharmacist

## 2020-04-30 ENCOUNTER — Other Ambulatory Visit: Payer: Self-pay

## 2020-04-30 ENCOUNTER — Inpatient Hospital Stay (HOSPITAL_BASED_OUTPATIENT_CLINIC_OR_DEPARTMENT_OTHER): Payer: PPO | Admitting: Family

## 2020-04-30 ENCOUNTER — Encounter: Payer: Self-pay | Admitting: Family

## 2020-04-30 VITALS — BP 126/57 | HR 85 | Temp 98.4°F | Resp 18 | Ht 68.5 in | Wt 308.8 lb

## 2020-04-30 DIAGNOSIS — D696 Thrombocytopenia, unspecified: Secondary | ICD-10-CM

## 2020-04-30 DIAGNOSIS — Z7982 Long term (current) use of aspirin: Secondary | ICD-10-CM | POA: Insufficient documentation

## 2020-04-30 DIAGNOSIS — Z809 Family history of malignant neoplasm, unspecified: Secondary | ICD-10-CM

## 2020-04-30 DIAGNOSIS — Z833 Family history of diabetes mellitus: Secondary | ICD-10-CM

## 2020-04-30 DIAGNOSIS — Z79899 Other long term (current) drug therapy: Secondary | ICD-10-CM | POA: Diagnosis not present

## 2020-04-30 DIAGNOSIS — Z7984 Long term (current) use of oral hypoglycemic drugs: Secondary | ICD-10-CM | POA: Diagnosis not present

## 2020-04-30 DIAGNOSIS — K760 Fatty (change of) liver, not elsewhere classified: Secondary | ICD-10-CM | POA: Diagnosis not present

## 2020-04-30 DIAGNOSIS — Z8249 Family history of ischemic heart disease and other diseases of the circulatory system: Secondary | ICD-10-CM

## 2020-04-30 DIAGNOSIS — I10 Essential (primary) hypertension: Secondary | ICD-10-CM

## 2020-04-30 DIAGNOSIS — E119 Type 2 diabetes mellitus without complications: Secondary | ICD-10-CM | POA: Insufficient documentation

## 2020-04-30 LAB — CBC WITH DIFFERENTIAL (CANCER CENTER ONLY)
Abs Immature Granulocytes: 0.02 10*3/uL (ref 0.00–0.07)
Basophils Absolute: 0 10*3/uL (ref 0.0–0.1)
Basophils Relative: 1 %
Eosinophils Absolute: 0.1 10*3/uL (ref 0.0–0.5)
Eosinophils Relative: 2 %
HCT: 41 % (ref 39.0–52.0)
Hemoglobin: 14.4 g/dL (ref 13.0–17.0)
Immature Granulocytes: 0 %
Lymphocytes Relative: 22 %
Lymphs Abs: 1.3 10*3/uL (ref 0.7–4.0)
MCH: 34 pg (ref 26.0–34.0)
MCHC: 35.1 g/dL (ref 30.0–36.0)
MCV: 96.9 fL (ref 80.0–100.0)
Monocytes Absolute: 0.5 10*3/uL (ref 0.1–1.0)
Monocytes Relative: 8 %
Neutro Abs: 3.8 10*3/uL (ref 1.7–7.7)
Neutrophils Relative %: 67 %
Platelet Count: 84 10*3/uL — ABNORMAL LOW (ref 150–400)
RBC: 4.23 MIL/uL (ref 4.22–5.81)
RDW: 13.2 % (ref 11.5–15.5)
WBC Count: 5.7 10*3/uL (ref 4.0–10.5)
nRBC: 0 % (ref 0.0–0.2)

## 2020-04-30 LAB — CMP (CANCER CENTER ONLY)
ALT: 28 U/L (ref 0–44)
AST: 26 U/L (ref 15–41)
Albumin: 4.2 g/dL (ref 3.5–5.0)
Alkaline Phosphatase: 99 U/L (ref 38–126)
Anion gap: 9 (ref 5–15)
BUN: 15 mg/dL (ref 8–23)
CO2: 26 mmol/L (ref 22–32)
Calcium: 9.6 mg/dL (ref 8.9–10.3)
Chloride: 102 mmol/L (ref 98–111)
Creatinine: 0.83 mg/dL (ref 0.61–1.24)
GFR, Estimated: >60 mL/min (ref >60)
Glucose, Bld: 157 mg/dL — ABNORMAL HIGH (ref 70–99)
Potassium: 3.6 mmol/L (ref 3.5–5.1)
Sodium: 137 mmol/L (ref 135–145)
Total Bilirubin: 0.4 mg/dL (ref 0.3–1.2)
Total Protein: 7.3 g/dL (ref 6.5–8.1)

## 2020-04-30 LAB — PLATELET BY CITRATE

## 2020-04-30 LAB — LACTATE DEHYDROGENASE: LDH: 115 U/L (ref 98–192)

## 2020-04-30 LAB — SAVE SMEAR(SSMR), FOR PROVIDER SLIDE REVIEW

## 2020-04-30 NOTE — Chronic Care Management (AMB) (Signed)
    Chronic Care Management Pharmacy Assistant   Name: Billy Hale  MRN: [id number] DOB: Jul 22, 1950  Reason for Encounter: Patient Assistance Coordination   04/30/2020- Patient assistance form filled out for Ozempic with [employer] patient assistance program. Called patient to inform that I will place application in the mail with all areas need to be filled in or attached highlighted and tagged. Patient aware to take forms back to Endocrinologist Dr. Elvera Lennox to sign and fax.  [name], CPP notified.  Medications: Outpatient Encounter Medications as of 04/30/2020  Medication Sig Note  . amLODipine (NORVASC) 10 MG tablet Take 1 tablet (10 mg total) by mouth every morning.   Marland Kitchen aspirin EC 81 MG tablet Take 81 mg by mouth daily.   . Continuous Blood Gluc Sensor (FREESTYLE LIBRE 2 SENSOR) MISC 1 each by Does not apply route every 14 (fourteen) days.   . fexofenadine (ALLEGRA) 180 MG tablet Take 180 mg by mouth daily.   Marland Kitchen glucose blood (ONETOUCH VERIO) test strip USE AS DIRECTED 3 TIMES A DAY   . Insulin Syringe-Needle U-100 (BD INSULIN SYRINGE U/F) 31G X 5/16" 1 ML MISC USE 3 TIMES DAILY BEFORE MEALS   . metFORMIN (GLUCOPHAGE) 1000 MG tablet TAKE 2 TABLETS BY MOUTH EVERY DAY WITH SUPPER (Patient taking differently: 1,000 mg 2 (two) times daily with a meal.)   . NOVOLIN N RELION 100 UNIT/ML injection INJECT 40 UNITS SUBCUTANEOUSLY THREE TIMES DAILY   . NOVOLIN R RELION 100 UNIT/ML injection INJECT 60 UNITS SUBCUTANEOUSLY THREE TIMES DAILY   . omeprazole (PRILOSEC) 20 MG capsule Take 1 capsule (20 mg total) by mouth daily. (Patient taking differently: Take 20 mg by mouth as needed.)   . Semaglutide (OZEMPIC, 0.25 OR 0.5 MG/DOSE, [place]) Inject 0.5 mg into the skin once a week. 04/30/2020: Patient takes on Sundays.  Marland Kitchen sertraline (ZOLOFT) 100 MG tablet Take 1 tablet (100 mg total) by mouth daily.   . simvastatin (ZOCOR) 20 MG tablet Take 1 tablet (20 mg total) by mouth daily.   .  valsartan-hydrochlorothiazide (DIOVAN-HCT) 160-25 MG tablet Take 1 tablet by mouth daily.    No facility-administered encounter medications on file as of 04/30/2020.   Star Rating Drugs: Simvastatin 20 mg- Last filled 02/09/2020 for 90 day supply at [facility]. Metformin 1000 mg- Last filled 02/09/2020 for 90 day supply at [facility].  SIG: Billee Cashing, CMA Clinical Pharmacist Assistant [phone]
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
## 2020-05-03 ENCOUNTER — Telehealth: Payer: Self-pay | Admitting: Pharmacist

## 2020-05-03 NOTE — Chronic Care Management (AMB) (Addendum)
Chronic Care Management Pharmacy Assistant   Name: Billy Hale  MRN: [id number] DOB: 1950-03-13  Reason for Encounter: Medication Review-Medication Coordination Call   Recent office visits:  None  Recent consult visits:  04.11.2022 Eliezer Bottom, NP Nurse Practitioner  Hospital visits:  None in previous 6 months  Medications: Outpatient Encounter Medications as of 05/03/2020  Medication Sig Note   amLODipine (NORVASC) 10 MG tablet Take 1 tablet (10 mg total) by mouth every morning.    aspirin EC 81 MG tablet Take 81 mg by mouth daily.    Continuous Blood Gluc Sensor (FREESTYLE LIBRE 2 SENSOR) MISC 1 each by Does not apply route every 14 (fourteen) days.    fexofenadine (ALLEGRA) 180 MG tablet Take 180 mg by mouth daily.    glucose blood (ONETOUCH VERIO) test strip USE AS DIRECTED 3 TIMES A DAY    Insulin Syringe-Needle U-100 (BD INSULIN SYRINGE U/F) 31G X 5/16" 1 ML MISC USE 3 TIMES DAILY BEFORE MEALS    metFORMIN (GLUCOPHAGE) 1000 MG tablet TAKE 2 TABLETS BY MOUTH EVERY DAY WITH SUPPER (Patient taking differently: 1,000 mg 2 (two) times daily with a meal.)    NOVOLIN N RELION 100 UNIT/ML injection INJECT 40 UNITS SUBCUTANEOUSLY THREE TIMES DAILY    NOVOLIN R RELION 100 UNIT/ML injection INJECT 60 UNITS SUBCUTANEOUSLY THREE TIMES DAILY    omeprazole (PRILOSEC) 20 MG capsule Take 1 capsule (20 mg total) by mouth daily. (Patient taking differently: Take 20 mg by mouth as needed.)    Semaglutide (OZEMPIC, 0.25 OR 0.5 MG/DOSE, [place]) Inject 0.5 mg into the skin once a week. 04/30/2020: Patient takes on Sundays.   sertraline (ZOLOFT) 100 MG tablet Take 1 tablet (100 mg total) by mouth daily.    simvastatin (ZOCOR) 20 MG tablet Take 1 tablet (20 mg total) by mouth daily.    valsartan-hydrochlorothiazide (DIOVAN-HCT) 160-25 MG tablet Take 1 tablet by mouth daily.    No facility-administered encounter medications on file as of 05/03/2020.   Reviewed chart for medication changes ahead  of medication coordination call.  BP Readings from Last 3 Encounters:  04/30/20 (!) 126/57  03/15/20 130/80  02/24/20 130/70    Lab Results  Component Value Date   HGBA1C 6.4 (A) 03/15/2020    Patient obtains medications through Adherence Packaging  90 Days  Last adherence delivery included:  Amlodipine 10 mg; one tablet at breakfast Metformin 1000 mg; two tablets at dinner Sertraline 100 mg; one tablet at breakfast Simvastatin 20 mg; one tablet at dinner Valsartan/ HCTZ 160/25 mg; one tablet at breakfast Aspirin 81 mg; one tablet with breakfast BD Insulin Syringes 31G x 5/16" 1 ML: Patient request BD brand OneTouch Verio test strips; use to check blood sugar three times daily Omeprazole 20 mg; one capsule at breakfast Allegra 180 mg: one tablet at breakfast Freestyle Libre kit Sensor: Check blood sugar as directed  Patient declined the following medications last month due to PRN use/additional supply on hand. Freestyle Libre kit Sensor: Check blood sugar as directed Insulin NPH (Novolin N):  40 units at night getting from [facility] since brand is more cost effective Insulin R (Novolin R): 60 units getting from [facility] since brand is more cost effective   Patient is due for next adherence delivery on: 05/11/2020. Called patient and reviewed medications and coordinated delivery. This delivery to include: Amlodipine 10 mg; one tablet at breakfast Metformin 1000 mg; two tablets at dinner Sertraline 100 mg; one tablet with breakfast Simvastatin 20 mg; one  tablet with dinner Valsartan/ HCTZ 160/25 mg; one tablet at breakfast Aspirin 81 mg; one tablet at breakfast Omeprazole 20 mg; one capsule at breakfast Allegra 180 mg: one tablet at breakfast BD Insulin Syringes 31G x 5/16" 1 ML: Patient request BD brand Semaglutide ( Ozempic) 0.5 mg: inject 0.5 mg into skin one a week  Patient declined the following medications due to the following reasons. OneTouch Verio test strips; use to  check blood sugar three times daily Freestyle Libre kit Sensor: Check blood sugar as directed Insulin NPH (Novolin N):  40 units at night getting from [place] since brand is more cost effective Insulin R (Novolin R): 60 units getting from [place] since brand is more cost effective  Patient needs refills for : Sertraline 100 mg; one tablet with breakfast Simvastatin 20 mg; one tablet with dinner Valsartan/ HCTZ 160/25 mg; one tablet with breakfast  Confirmed delivery date of 05/11/2020, advised patient that pharmacy will contact them the morning of delivery.  Star Rating Drugs:  Dispensed Quantity Pharmacy  Simvastatin 01.20.2022 90 [facility]  Valsartan/HCTZ 01.20.2022 90 [facility]  Semaglutide (OZEMPIC) 0.5 02.24.2022  [facility]  Metformin 01.20.2022 180 [facility]    Amilia Revonda Standard, [place] [phone]

## 2020-05-07 ENCOUNTER — Other Ambulatory Visit: Payer: Self-pay

## 2020-05-07 ENCOUNTER — Ambulatory Visit (HOSPITAL_BASED_OUTPATIENT_CLINIC_OR_DEPARTMENT_OTHER)
Admission: RE | Admit: 2020-05-07 | Discharge: 2020-05-07 | Disposition: A | Discharge status: Home / Self Care | Payer: PPO | Source: Ambulatory Visit | Attending: Family | Admitting: Family

## 2020-05-07 ENCOUNTER — Telehealth: Payer: Self-pay | Admitting: Family

## 2020-05-07 DIAGNOSIS — D696 Thrombocytopenia, unspecified: Secondary | ICD-10-CM

## 2020-05-07 DIAGNOSIS — K8020 Calculus of gallbladder without cholecystitis without obstruction: Secondary | ICD-10-CM | POA: Diagnosis not present

## 2020-05-07 DIAGNOSIS — K760 Fatty (change of) liver, not elsewhere classified: Secondary | ICD-10-CM | POA: Diagnosis not present

## 2020-05-07 NOTE — Telephone Encounter (Signed)
I spoke with the patient's wife [name] and went over abdominal US which showed hepatic steatosis and splenomegaly. We will repeat his [place] in another 6 months to follow-up. Also, they will let us know if he needs to have surgery. We can give Nplate if needed to increase his platelets prior to. No questions at this time. She is appreciative of call.

## 2020-05-10 ENCOUNTER — Other Ambulatory Visit: Payer: Self-pay | Admitting: Adult Health

## 2020-05-10 DIAGNOSIS — F339 Major depressive disorder, recurrent, unspecified: Secondary | ICD-10-CM

## 2020-05-10 DIAGNOSIS — I10 Essential (primary) hypertension: Secondary | ICD-10-CM

## 2020-05-11 ENCOUNTER — Telehealth: Payer: Self-pay | Admitting: Internal Medicine

## 2020-05-11 DIAGNOSIS — E1165 Type 2 diabetes mellitus with hyperglycemia: Secondary | ICD-10-CM

## 2020-05-11 MED ORDER — OZEMPIC (0.25 OR 0.5 MG/DOSE) 2 MG/1.5ML ~~LOC~~ SOPN
0.5000 mg | PEN_INJECTOR | SUBCUTANEOUS | 1 refills | Status: DC
Start: 2020-05-11 — End: 2020-11-20

## 2020-05-11 NOTE — Telephone Encounter (Signed)
MEDICATION: Ozempic  PHARMACY:  [facility]  HAS THE PATIENT CONTACTED THEIR PHARMACY?  Pharmacy was who called  IS THIS A 90 DAY SUPPLY :  yes  IS PATIENT OUT OF MEDICATION:   IF NOT; HOW MUCH IS LEFT:   LAST APPOINTMENT DATE: @3 /23/2022  NEXT APPOINTMENT DATE:@6 /30/2022  DO WE HAVE YOUR PERMISSION TO LEAVE A DETAILED MESSAGE?:  OTHER COMMENTS:  [facility] called to get refill so they can hopefully send all patient RX in same shipment

## 2020-05-11 NOTE — Telephone Encounter (Signed)
Rx sent to preferred pharmacy.

## 2020-05-14 ENCOUNTER — Ambulatory Visit (HOSPITAL_BASED_OUTPATIENT_CLINIC_OR_DEPARTMENT_OTHER): Payer: PPO

## 2020-05-18 ENCOUNTER — Other Ambulatory Visit: Payer: Self-pay

## 2020-05-18 DIAGNOSIS — Z794 Long term (current) use of insulin: Secondary | ICD-10-CM

## 2020-05-18 DIAGNOSIS — E1165 Type 2 diabetes mellitus with hyperglycemia: Secondary | ICD-10-CM

## 2020-05-18 MED ORDER — INSULIN NPH (HUMAN) (ISOPHANE) 100 UNIT/ML ~~LOC~~ SUSP: SUBCUTANEOUS | 0 refills | Status: DC

## 2020-05-18 MED ORDER — INSULIN REGULAR HUMAN 100 UNIT/ML IJ SOLN: INTRAMUSCULAR | 0 refills | Status: DC

## 2020-05-22 ENCOUNTER — Telehealth: Payer: Self-pay

## 2020-05-22 NOTE — Telephone Encounter (Signed)
Inbound fax from [employer] advising pt has been approved through December 19, 2020. Pt advised via MyChart message.

## 2020-05-29 NOTE — Telephone Encounter (Signed)
Called and advised pt assistance Ozempic (5 boxes) has been delivered to the office and is ready for pick up.

## 2020-06-06 ENCOUNTER — Encounter: Payer: Self-pay | Admitting: Internal Medicine

## 2020-06-26 ENCOUNTER — Telehealth: Payer: Self-pay | Admitting: Pharmacist

## 2020-06-26 NOTE — Chronic Care Management (AMB) (Signed)
    Chronic Care Management Pharmacy Assistant   Name: Billy Hale  MRN: [id number] DOB: May 07, 1950  06/26/20- Patient called to remind of appointment with [name]) on (06/27/20 at 1pm by phone)   Spoke with patients wife [name] and Patient aware of appointment date, time, and type of appointment (either telephone or in person). Patient aware to have/bring all medications, supplements, blood pressure and/or blood sugar logs to visit.  Questions: Have you had any recent office visit or specialist visit outside of [facility]?   No Are there any concerns you would like to discuss during your office visit?   [name] states she will be the one to do patients appointment because he wont do it. She does not know of nay patient concerns.  Are you having any problems obtaining your medications? (Whether it pharmacy issues or cost)   No. If patient has any PAP medications ask if they are having any problems getting their PAP medication or refill?   Yes. Patient receives assistance for Ozempic.  Star Rating Drug:   Metformin 1000mg  - last filled on 05/10/20 90DS at [facility]   semaglutide - last filled on 05/11/20 90DS at [facility]  Simvastatin 20mg  - last filled on 05/11/20 90DS at [facility]  Valsartan-HCTZ 160-25mg  - last filled on 05/11/20 90DS at [facility]  Any gaps in medications fill history? No4/24/22 CMA  Clinical Pharmacist Assistant [phone]

## 2020-06-27 ENCOUNTER — Ambulatory Visit (INDEPENDENT_AMBULATORY_CARE_PROVIDER_SITE_OTHER): Payer: PPO | Admitting: Pharmacist

## 2020-06-27 DIAGNOSIS — I10 Essential (primary) hypertension: Secondary | ICD-10-CM

## 2020-06-27 DIAGNOSIS — Z794 Long term (current) use of insulin: Secondary | ICD-10-CM | POA: Diagnosis not present

## 2020-06-27 DIAGNOSIS — E1165 Type 2 diabetes mellitus with hyperglycemia: Secondary | ICD-10-CM | POA: Diagnosis not present

## 2020-06-27 NOTE — Progress Notes (Signed)
Chronic Care Management Pharmacy Note  07/03/2020 Name:  Billy Hale MRN:  [id number] DOB:  08-02-1950  Summary: A1c at goal of <7%  Recommendations/Changes made from today's visit: -Recommended switching to [place] and Novolog or [place] for better coverage of BGs and based on cost concerns   Plan: Follow up in 6 months   Subjective: Billy Hale is an 70 y.o. year old male who is a primary patient of Dorothyann Peng, NP.  The CCM team was consulted for assistance with disease management and care coordination needs.    Engaged with patient by telephone for follow up visit in response to provider referral for pharmacy case management and/or care coordination services.   Consent to Services:  The patient was given information about Chronic Care Management services, agreed to services, and gave verbal consent prior to initiation of services.  Please see initial visit note for detailed documentation.   Patient Care Team: Dorothyann Peng, NP as PCP - General (Family Medicine) Viona Gilmore, [facility] as Pharmacist (Pharmacist)  Recent office visits: 02/24/20 Dorothyann Peng, NP: Patient presented for annual exam.  Recent consult visits: 04/30/20 Laverna Peace, NP (hem/onc): Patient presented for initial visit for thrombocytopenia. BP low in office.  03/15/20 Philemon Kingdom, MD (endo): Patient presented for DM follow up. Started Ozempic 0.25 mg x 4 weeks and then increase to 0.5 mg weekly.  Hospital visits: None in previous 6 months   Objective:  Lab Results  Component Value Date   CREATININE 0.83 04/30/2020   BUN 15 04/30/2020   GFR 91.91 02/24/2020   GFRNONAA >60 04/30/2020   GFRAA 104 01/06/2018   NA 137 04/30/2020   K 3.6 04/30/2020   CALCIUM 9.6 04/30/2020   CO2 26 04/30/2020   GLUCOSE 157 (H) 04/30/2020    Lab Results  Component Value Date/Time   HGBA1C 6.4 (A) 03/15/2020 11:09 AM   HGBA1C 6.4 (A) 11/08/2019 04:56 PM   HGBA1C 8.6 (H) 01/23/2017 09:46 AM    HGBA1C 8.1 (H) 04/20/2015 09:02 AM   GFR 91.91 02/24/2020 10:57 AM   GFR 108.15 02/04/2019 09:43 AM   MICROALBUR 61.9 (H) 01/06/2018 09:21 AM   MICROALBUR 43.0 (H) 04/20/2015 09:02 AM    Last diabetic Eye exam:  Lab Results  Component Value Date/Time   HMDIABEYEEXA No Retinopathy 06/10/2017 12:00 AM    Last diabetic Foot exam:  Lab Results  Component Value Date/Time   HMDIABFOOTEX done 11/08/2010 12:00 AM     Lab Results  Component Value Date   CHOL 132 02/24/2020   HDL 45.20 02/24/2020   LDLCALC 63 02/24/2020   LDLDIRECT 72.9 11/01/2010   TRIG 119.0 02/24/2020   CHOLHDL 3 02/24/2020    Hepatic Function Latest Ref Rng & Units 04/30/2020 02/24/2020 02/04/2019  Total Protein 6.5 - 8.1 g/dL 7.3 7.5 7.1  Albumin 3.5 - 5.0 g/dL 4.2 4.1 3.9  AST 15 - 41 U/L 26 26 20   ALT 0 - 44 U/L 28 34 26  Alk Phosphatase 38 - 126 U/L 99 121(H) 120(H)  Total Bilirubin 0.3 - 1.2 mg/dL 0.4 0.5 0.4  Bilirubin, Direct 0.0 - 0.3 mg/dL - - -    Lab Results  Component Value Date/Time   TSH 3.64 02/24/2020 10:57 AM   TSH 3.84 02/04/2019 09:43 AM   FREET4 0.64 02/04/2019 09:43 AM   FREET4 0.56 (L) 01/06/2018 09:21 AM    CBC Latest Ref Rng & Units 04/30/2020 04/11/2020 03/09/2020  WBC 4.0 - 10.5 K/uL 5.7 5.4 5.1  Hemoglobin  13.0 - 17.0 g/dL 14.4 14.1 14.2  Hematocrit 39.0 - 52.0 % 41.0 40.1 41.6  Platelets 150 - 400 K/uL 84(L) 85.0(L) 89.0(L)    No results found for: VD25OH  Clinical ASCVD: No  The 10-year ASCVD risk score Mikey Bussing DC Jr., et al., 2013) is: 30.9%   Values used to calculate the score:     Age: 25 years     Sex: Male     Is Non-Hispanic African American: No     Diabetic: Yes     Tobacco smoker: No     Systolic Blood Pressure: 774 mmHg     Is BP treated: Yes     HDL Cholesterol: 45.2 mg/dL     Total Cholesterol: 132 mg/dL    Depression screen [facility] 2/9 02/24/2020 11/07/2019 02/04/2019  Decreased Interest 0 0 0  Down, Depressed, Hopeless 0 0 0  PHQ - 2 Score 0 0 0  Altered  sleeping - 0 0  Tired, decreased energy - 0 0  Change in appetite - 0 0  Feeling bad or failure about yourself  - 0 0  Trouble concentrating - 0 0  Moving slowly or fidgety/restless - 0 0  Suicidal thoughts - 0 0  PHQ-9 Score - 0 0  Difficult doing work/chores - Not difficult at all Not difficult at all      Social History   Tobacco Use  Smoking Status Never  Smokeless Tobacco Never   BP Readings from Last 3 Encounters:  04/30/20 (!) 126/57  03/15/20 130/80  02/24/20 130/70   Pulse Readings from Last 3 Encounters:  04/30/20 85  03/15/20 97  11/08/19 94   Wt Readings from Last 3 Encounters:  04/30/20 (!) 308 lb 12.8 oz (140.1 kg)  03/15/20 (!) 316 lb 6.4 oz (143.5 kg)  02/24/20 (!) 316 lb (143.3 kg)   BMI Readings from Last 3 Encounters:  04/30/20 46.27 kg/m  03/15/20 47.41 kg/m  02/24/20 47.35 kg/m    Assessment/Interventions: Review of patient past medical history, allergies, medications, health status, including review of consultants reports, laboratory and other test data, was performed as part of comprehensive evaluation and provision of chronic care management services.   SDOH:  (Social Determinants of Health) assessments and interventions performed: No  SDOH Screenings   Alcohol Screen: Low Risk    Last Alcohol Screening Score (AUDIT): 0  Depression (PHQ2-9): Low Risk    PHQ-2 Score: 0  Financial Resource Strain: Low Risk    Difficulty of Paying Living Expenses: Not hard at all  Food Insecurity: No Food Insecurity   Worried About Charity fundraiser in the Last Year: Never true   Ran Out of Food in the Last Year: Never true  Housing: Low Risk    Last Housing Risk Score: 0  Physical Activity: Inactive   Days of Exercise per Week: 0 days   Minutes of Exercise per Session: 0 min  Social Connections: Moderately Integrated   Frequency of Communication with Friends and Family: More than three times a week   Frequency of Social Gatherings with Friends  and Family: Three times a week   Attends Religious Services: Never   Active Member of Clubs or Organizations: Yes   Attends Music therapist: More than 4 times per year   Marital Status: Married  Stress: No Stress Concern Present   Feeling of Stress : Not at all  Tobacco Use: Low Risk    Smoking Tobacco Use: Never   Smokeless Tobacco Use:  Never  Transportation Needs: No Transportation Needs   Lack of Transportation (Medical): No   Lack of Transportation (Non-Medical): No    CCM Care Plan  No Known Allergies  Medications Reviewed Today     Reviewed by Hebert Soho, RN (Registered Nurse) on 04/30/20 at 1001  Med List Status: <None>   Medication Order Taking? Sig Documenting Provider Last Dose Status Informant  amLODipine (NORVASC) 10 MG tablet [id number] Yes Take 1 tablet (10 mg total) by mouth every morning. Nafziger, Tommi Rumps, NP Taking Active   aspirin EC 81 MG tablet [id number] Yes Take 81 mg by mouth daily. [provider] Taking Active Spouse/Significant Other  Continuous Blood Gluc Sensor (FREESTYLE LIBRE 2 SENSOR) MISC [id number] Yes 1 each by Does not apply route every 14 (fourteen) days. Philemon Kingdom, MD Taking Active   fexofenadine [facility]) 180 MG tablet [id number] Yes Take 180 mg by mouth daily. [provider] Taking Active Self  glucose blood (ONETOUCH VERIO) test strip [id number] Yes USE AS DIRECTED 3 TIMES A Billie Ruddy, MD Taking Active   Insulin Syringe-Needle U-100 (BD INSULIN SYRINGE U/F) 31G X 5/16" 1 ML MISC [id number] Yes USE 3 TIMES DAILY BEFORE MEALS Philemon Kingdom, MD Taking Active   metFORMIN (GLUCOPHAGE) 1000 MG tablet [id number] Yes TAKE 2 TABLETS BY MOUTH EVERY DAY WITH SUPPER  Patient taking differently: 1,000 mg 2 (two) times daily with a meal.   Philemon Kingdom, MD Taking Active   NOVOLIN N RELION 100 UNIT/ML injection [id number] Yes INJECT [place] Philemon Kingdom, MD Taking  Active   NOVOLIN R RELION 100 UNIT/ML injection [id number] Yes INJECT 60 UNITS SUBCUTANEOUSLY THREE TIMES DAILY Philemon Kingdom, MD Taking Active   omeprazole (PRILOSEC) 20 MG capsule [id number] Yes Take 1 capsule (20 mg total) by mouth daily.  Patient taking differently: Take 20 mg by mouth as needed.   Nafziger, Tommi Rumps, NP Taking Active   Semaglutide (OZEMPIC, 0.25 OR 0.5 MG/DOSE, [place]) [id number] Yes Inject 0.5 mg into the skin once a week. [provider] Taking Active Self           Med Note Tyler Pita, Jaquita Folds Apr 30, 2020 10:01 AM) Patient takes on Sundays.  sertraline (ZOLOFT) 100 MG tablet [id number] Yes Take 1 tablet (100 mg total) by mouth daily. Nafziger, Tommi Rumps, NP Taking Active   simvastatin (ZOCOR) 20 MG tablet [id number] Yes Take 1 tablet (20 mg total) by mouth daily. Nafziger, Tommi Rumps, NP Taking Active   valsartan-hydrochlorothiazide (DIOVAN-HCT) 160-25 MG tablet [id number] Yes Take 1 tablet by mouth daily. Dorothyann Peng, NP Taking Active             Patient Active Problem List   Diagnosis Date Noted   Elevated TSH 04/19/2018   Type 2 diabetes mellitus with hyperglycemia, with long-term current use of insulin ([place]) 07/12/2015   Chronic right shoulder pain 05/08/2014   OSA (obstructive sleep apnea) 05/13/2013   Preventative health care 05/13/2013   Fatty liver 09/09/2012   BPV (benign positional vertigo) 09/09/2012   Nonspecific elevation of levels of transaminase or lactic acid dehydrogenase (LDH) 09/09/2012   OBESITY 09/10/2007   TESTOSTERONE DEFICIENCY 12/25/2006   Hyperlipidemia 12/25/2006   Depression, recurrent ([place]) 12/25/2006   Essential hypertension 12/25/2006    Immunization History  Administered Date(s) Administered   Fluad Quad(high Dose 65+) 11/10/2018   Influenza,inj,Quad PF,6+ Mos 11/16/2012   PFIZER(Purple Top)SARS-COV-2 Vaccination 04/28/2019, 05/23/2019   Pneumococcal Polysaccharide-23 08/30/2008, 02/04/2019   Td 04/20/2004  Tdap 11/19/2014    Zoster, Live 07/10/2010    Conditions to be addressed/monitored:  Hypertension, Hyperlipidemia, Diabetes, GERD and Depression  Care Plan : CCM Pharmacy Care Plan  Updates made by Viona Gilmore, [place] since 07/03/2020 12:00 AM     Problem: Problem: Hypertension, Hyperlipidemia, Diabetes, GERD and Depression      Long-Range Goal: Patient-Specific Goal   Start Date: 06/27/2020  Expected End Date: 06/27/2021  This Visit's Progress: On track  Priority: High  Note:   Current Barriers:  Unable to independently afford treatment regimen Unable to independently monitor therapeutic efficacy Suboptimal therapeutic regimen for diabetes  Pharmacist Clinical Goal(s):  Patient will verbalize ability to afford treatment regimen achieve adherence to monitoring guidelines and medication adherence to achieve therapeutic efficacy through collaboration with PharmD and provider.   Interventions: 1:1 collaboration with Dorothyann Peng, NP regarding development and update of comprehensive plan of care as evidenced by provider attestation and co-signature Inter-disciplinary care team collaboration (see longitudinal plan of care) Comprehensive medication review performed; medication list updated in electronic medical record  Hypertension (BP goal <130/80) -Controlled -Current treatment: Amlodipine 40m, 1 tablet once daily  Valsartan-hydrochlorothiazide 160-2104m 1 tablet once daily  -Medications previously tried: benazepril, losartan  -Current home readings: does not check at home; needs to get a new BP cuff -Current dietary habits: did not discuss -Current exercise habits: did not discuss -Denies hypotensive/hypertensive symptoms -Educated on BP goals and benefits of medications for prevention of heart attack, stroke and kidney damage; Importance of home blood pressure monitoring; Proper BP monitoring technique; -Counseled to monitor BP at home weekly, document, and provide log at future  appointments -Counseled on diet and exercise extensively Recommended to continue current medication  Hyperlipidemia: (LDL goal < 70) -Controlled -Current treatment: Simvastatin 203m1 tablet once daily  -Medications previously tried: fenofibrate -Current dietary patterns: did not discuss -Current exercise habits: did not discuss -Educated on Cholesterol goals;  Benefits of statin for ASCVD risk reduction; Importance of limiting foods high in cholesterol; -Counseled on diet and exercise extensively Recommended to continue current medication  Diabetes (A1c goal <7%) -Controlled -Current medications: Ozempic 0.5 mg inject once weekly Novolin R inject as directed by endocrinology Novolin N inject as directed by endocrinology -Medications previously tried: n/a  -Current home glucose readings fasting glucose: 120-130s, 55, 70, highest 227  post prandial glucose: does not check often -Reports hypoglycemic/hyperglycemic symptoms -Current meal patterns:  breakfast: did not discuss  lunch: did not discuss   dinner: did not discuss  snacks: did not discuss  drinks: did not discuss  -Current exercise: did not discuss -Educated on A1c and blood sugar goals; Prevention and management of hypoglycemic episodes; Carbohydrate counting and/or plate method -Counseled to check feet daily and get yearly eye exams -Counseled on diet and exercise extensively Recommended to continue current medication Recommended switching to alternative insulins based on being approved for NovoNordisk  Depression (Goal: minimize symptoms) -Controlled -Current treatment: Sertraline 100m13m tablet once daily -Medications previously tried/failed: none -PHQ9: 0 -Educated on Benefits of medication for symptom control -Recommended to continue current medication  GERD (Goal: minimize symptoms) -Controlled -Current treatment  Omeprazole 20mg79mcapsule once daily (patient reported not taking  everyday) -Medications previously tried: none  -Recommended to continue current medication  Health Maintenance -Vaccine gaps: shingrix, COVID booster, Prevnar -Current therapy:  No medications -Educated on Cost vs benefit of each product must be carefully weighed by individual consumer -Patient is satisfied with current therapy and denies issues -Recommended to continue  as is  Patient Goals/Self-Care Activities Patient will:  - take medications as prescribed check glucose multiple times a day, document, and provide at future appointments check blood pressure weekly, document, and provide at future appointments target a minimum of 150 minutes of moderate intensity exercise weekly  Follow Up Plan: Telephone follow up appointment with care management team member scheduled for: 6 months      Medication Assistance:  Ozempic obtained through NovoNordisk medication assistance program.  Enrollment ends 01/19/21  Compliance/Adherence/Medication fill history: Care Gaps: Shingrix, colonoscopy, COVID booster, foot exam, eye exam, Prevnar  Star-Rating Drugs: Metformin 1055m - last filled on 05/10/20 90DS at [facility]  semaglutide - last filled on 05/11/20 90DS at [facility] (will be PAP moving forward) Simvastatin 232m- last filled on 05/11/20 90DS at [facility] Valsartan-HCTZ 160-253m last filled on 05/11/20 90DS at [facility]   Patient's preferred pharmacy is:  [facility] [place] [place]: [phone]: [phone] [place] [place]: [phone]: [phone] pill box? No - adherence packaging Pt endorses 100% compliance  We discussed: Benefits of medication synchronization, packaging and delivery as well as enhanced pharmacist oversight with [facility]. Patient decided  to: Utilize [facility] pharmacy for medication synchronization, packaging and delivery  Care Plan and Follow Up Patient Decision:  Patient agrees to Care Plan and Follow-up.  Plan: Telephone follow up appointment with care management team member scheduled for:  6 months  MadJeni SallesharmD, BCACambridgearmacist [place] [phone]

## 2020-06-27 NOTE — Patient Instructions (Addendum)
Hi Joe and [name],  It was great to speak with you over the telephone! Below is a summary of some of the topics we discussed.   Please reach out to me if you have any questions or need anything before our follow up!  Best, Maddie  [name], PharmD, [facility] Clinical Pharmacist [facility] [place] [phone]  Visit Information   Goals Addressed   None    Patient Care Plan: CCM Pharmacy Care Plan     Problem Identified: Problem: Hypertension, Hyperlipidemia, Diabetes, GERD and Depression      Long-Range Goal: Patient-Specific Goal   Start Date: 06/27/2020  Expected End Date: 06/27/2021  This Visit's Progress: On track  Priority: High  Note:   Current Barriers:  Unable to independently afford treatment regimen Unable to independently monitor therapeutic efficacy Suboptimal therapeutic regimen for diabetes  Pharmacist Clinical Goal(s):  Patient will verbalize ability to afford treatment regimen achieve adherence to monitoring guidelines and medication adherence to achieve therapeutic efficacy through collaboration with PharmD and provider.   Interventions: 1:1 collaboration with Shirline Frees, NP regarding development and update of comprehensive plan of care as evidenced by provider attestation and co-signature Inter-disciplinary care team collaboration (see longitudinal plan of care) Comprehensive medication review performed; medication list updated in electronic medical record  Hypertension (BP goal <130/80) -Controlled -Current treatment: Amlodipine 10mg , 1 tablet once daily  Valsartan-hydrochlorothiazide 160-25mg , 1 tablet once daily  -Medications previously tried: benazepril, losartan  -Current home readings: does not check at home; needs to get a new BP cuff -Current dietary habits: did not discuss -Current exercise habits: did not discuss -Denies hypotensive/hypertensive symptoms -Educated on BP goals and benefits of medications for prevention  of heart attack, stroke and kidney damage; Importance of home blood pressure monitoring; Proper BP monitoring technique; -Counseled to monitor BP at home weekly, document, and provide log at future appointments -Counseled on diet and exercise extensively Recommended to continue current medication  Hyperlipidemia: (LDL goal < 70) -Controlled -Current treatment: Simvastatin 20mg , 1 tablet once daily  -Medications previously tried: fenofibrate -Current dietary patterns: did not discuss -Current exercise habits: did not discuss -Educated on Cholesterol goals;  Benefits of statin for ASCVD risk reduction; Importance of limiting foods high in cholesterol; -Counseled on diet and exercise extensively Recommended to continue current medication  Diabetes (A1c goal <7%) -Controlled -Current medications: Ozempic 0.5 mg inject once weekly Novolin R inject as directed by endocrinology Novolin N inject as directed by endocrinology -Medications previously tried: n/a  -Current home glucose readings fasting glucose: 120-130s, 55, 70, highest 227  post prandial glucose: does not check often -Reports hypoglycemic/hyperglycemic symptoms -Current meal patterns:  breakfast: did not discuss  lunch: did not discuss   dinner: did not discuss  snacks: did not discuss  drinks: did not discuss  -Current exercise: did not discuss -Educated on A1c and blood sugar goals; Prevention and management of hypoglycemic episodes; Carbohydrate counting and/or plate method -Counseled to check feet daily and get yearly eye exams -Counseled on diet and exercise extensively Recommended to continue current medication Recommended switching to alternative insulins based on being approved for NovoNordisk  Depression (Goal: minimize symptoms) -Controlled -Current treatment: Sertraline 100mg , 1 tablet once daily -Medications previously tried/failed: none -PHQ9: 0 -Educated on Benefits of medication for symptom  control -Recommended to continue current medication  GERD (Goal: minimize symptoms) -Controlled -Current treatment  Omeprazole 20mg , 1 capsule once daily (patient reported not taking everyday) -Medications previously tried: none  -Recommended to continue current medication  Health  Maintenance -Vaccine gaps: shingrix, COVID booster, Prevnar -Current therapy:  No medications -Educated on Cost vs benefit of each product must be carefully weighed by individual consumer -Patient is satisfied with current therapy and denies issues -Recommended to continue as is  Patient Goals/Self-Care Activities Patient will:  - take medications as prescribed check glucose multiple times a day, document, and provide at future appointments check blood pressure weekly, document, and provide at future appointments target a minimum of 150 minutes of moderate intensity exercise weekly  Follow Up Plan: Telephone follow up appointment with care management team member scheduled for: 6 months       Patient verbalizes understanding of instructions provided today and agrees to view in MyChart.  Telephone follow up appointment with pharmacy team member scheduled for:6 months  Verner Chol, [facility]

## 2020-06-29 ENCOUNTER — Other Ambulatory Visit: Payer: Self-pay | Admitting: Internal Medicine

## 2020-07-10 ENCOUNTER — Telehealth: Payer: Self-pay | Admitting: *Deleted

## 2020-07-10 NOTE — Telephone Encounter (Signed)
Called and spoke to patient's wife to reschedule appointment from 08/31/20 to 09/05/20 - confirmed

## 2020-07-11 ENCOUNTER — Encounter: Payer: Self-pay | Admitting: Internal Medicine

## 2020-07-15 ENCOUNTER — Other Ambulatory Visit: Payer: Self-pay | Admitting: Internal Medicine

## 2020-07-15 DIAGNOSIS — Z794 Long term (current) use of insulin: Secondary | ICD-10-CM

## 2020-07-19 ENCOUNTER — Other Ambulatory Visit: Payer: Self-pay

## 2020-07-19 ENCOUNTER — Encounter: Payer: Self-pay | Admitting: Internal Medicine

## 2020-07-19 ENCOUNTER — Ambulatory Visit (INDEPENDENT_AMBULATORY_CARE_PROVIDER_SITE_OTHER): Payer: PPO | Admitting: Internal Medicine

## 2020-07-19 VITALS — BP 124/76 | HR 88 | Ht 68.5 in | Wt 306.0 lb

## 2020-07-19 DIAGNOSIS — E1165 Type 2 diabetes mellitus with hyperglycemia: Secondary | ICD-10-CM

## 2020-07-19 DIAGNOSIS — E782 Mixed hyperlipidemia: Secondary | ICD-10-CM | POA: Diagnosis not present

## 2020-07-19 DIAGNOSIS — E669 Obesity, unspecified: Secondary | ICD-10-CM

## 2020-07-19 DIAGNOSIS — Z794 Long term (current) use of insulin: Secondary | ICD-10-CM | POA: Diagnosis not present

## 2020-07-19 LAB — POCT GLYCOSYLATED HEMOGLOBIN (HGB A1C): Hemoglobin A1C: 5.3 % (ref 4.0–5.6)

## 2020-07-19 MED ORDER — INSULIN NPH (HUMAN) (ISOPHANE) 100 UNIT/ML ~~LOC~~ SUSP
SUBCUTANEOUS | 3 refills | Status: DC
Start: 2020-07-19 — End: 2020-10-03

## 2020-07-19 MED ORDER — INSULIN REGULAR HUMAN 100 UNIT/ML IJ SOLN
INTRAMUSCULAR | 3 refills | Status: DC
Start: 2020-07-19 — End: 2020-10-03

## 2020-07-19 MED ORDER — FIASP FLEXTOUCH 100 UNIT/ML ~~LOC~~ SOPN: PEN_INJECTOR | SUBCUTANEOUS | 3 refills | Status: DC

## 2020-07-19 MED ORDER — TRESIBA FLEXTOUCH 200 UNIT/ML ~~LOC~~ SOPN: PEN_INJECTOR | SUBCUTANEOUS | 3 refills | Status: DC
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
## 2020-07-19 NOTE — Patient Instructions (Addendum)
  Please continue: - Metformin 1000 mg 2x a day - Ozempic 0.5 mg weekly Insulin Before breakfast Before lunch Before dinner  Regular (short, clear) 30-40 30-40 30-40  NPH (long, cloudy) 60 60 60   Instead of NPH and regular insulin, try to switch to the following regimen if possible: - Tresiba 100 mg daily - FiAsp 30-40 units before meals  Before you get the new shipment of Ozempic, let's increase the dose to 1 mg weekly.  Please return in 4 months with your sugar log.

## 2020-07-19 NOTE — Addendum Note (Signed)
Addended by: Winn Jock on: 07/19/2020 11:54 AM   Modules accepted: Orders

## 2020-07-29 ENCOUNTER — Other Ambulatory Visit: Payer: Self-pay | Admitting: Adult Health

## 2020-07-29 ENCOUNTER — Other Ambulatory Visit: Payer: Self-pay | Admitting: Internal Medicine

## 2020-07-29 DIAGNOSIS — K2100 Gastro-esophageal reflux disease with esophagitis, without bleeding: Secondary | ICD-10-CM

## 2020-08-03 ENCOUNTER — Telehealth: Payer: Self-pay | Admitting: Pharmacist

## 2020-08-06 ENCOUNTER — Encounter: Payer: Self-pay | Admitting: Family

## 2020-08-06 NOTE — Chronic Care Management (AMB) (Addendum)
Chronic Care Management Pharmacy Assistant   Name: Billy Hale  MRN: [id number] DOB: 1950/03/18  Reason for Encounter: Medication Review-Medication Coordination Call   Recent office visits:  None  Recent consult visits:  06.30.2022 Philemon Kingdom, MD patient present for follow up visit for diabetes. Medication added Insulin Aspart (w/Niacinamide) 100 UNIT/ML Inject 20-40 units 3x a day before meals, under skin and Insulin Degludec 200 UNIT/ML Use 100-140 units under skin daily. Medication changes Insulin NPH Human (Isophane) 100 uints/ml change to 60 units subcutaneously three times a day. And Insulin Regular Human 100 units/mL change to inject 30-40 units subcutaneously three times a day.  Hospital visits:  None in previous 6 months  Medications: Outpatient Encounter Medications as of 08/03/2020  Medication Sig   amLODipine (NORVASC) 10 MG tablet Take 1 tablet (10 mg total) by mouth every morning.   aspirin EC 81 MG tablet Take 81 mg by mouth daily.   BD INSULIN SYRINGE U/F 31G X 5/16" 1 ML MISC USE THREE TIMES DAILY BEFORE MEALS   Continuous Blood Gluc Sensor (FREESTYLE LIBRE 2 SENSOR) MISC 1 each by Does not apply route every 14 (fourteen) days.   fexofenadine (ALLEGRA) 180 MG tablet Take 180 mg by mouth daily.   glucose blood (ONETOUCH VERIO) test strip USE AS DIRECTED 3 TIMES A DAY   insulin aspart (FIASP FLEXTOUCH) 100 UNIT/ML FlexTouch Pen Inject 20-40 units 3x a day before meals, under skin   insulin degludec (TRESIBA FLEXTOUCH) 200 UNIT/ML FlexTouch Pen Use 100-140 units under skin daily   insulin NPH Human (NOVOLIN N RELION) 100 UNIT/ML injection INJECT 60 UNITS SUBCUTANEOUSLY THREE TIMES DAILY   insulin regular (NOVOLIN R RELION) 100 units/mL injection INJECT 30-40 UNITS SUBCUTANEOUSLY THREE TIMES DAILY   metFORMIN (GLUCOPHAGE) 1000 MG tablet Take 1 tablet (1,000 mg total) by mouth 2 (two) times daily with a meal.   omeprazole (PRILOSEC) 20 MG capsule TAKE ONE CAPSULE  BY MOUTH EVERY MORNING   Semaglutide,0.25 or 0.5MG/DOS, (OZEMPIC, 0.25 OR 0.5 MG/DOSE,) 2 MG/1.5ML SOPN Inject 0.5 mg into the skin once a week.   sertraline (ZOLOFT) 100 MG tablet TAKE ONE TABLET BY MOUTH EVERY MORNING   simvastatin (ZOCOR) 20 MG tablet TAKE ONE TABLET BY MOUTH EVERY EVENING   valsartan-hydrochlorothiazide (DIOVAN-HCT) 160-25 MG tablet TAKE ONE TABLET BY MOUTH EVERY MORNING   No facility-administered encounter medications on file as of 08/03/2020.   Reviewed chart for medication changes ahead of medication coordination call.  BP Readings from Last 3 Encounters:  07/19/20 124/76  04/30/20 (!) 126/57  03/15/20 130/80    Lab Results  Component Value Date   HGBA1C 5.3 07/19/2020    Patient obtains medications through Adherence Packaging  90 Days   Last adherence delivery included:  Amlodipine 10 mg; one tablet at breakfast Metformin 1000 mg; two tablets at breakfast Sertraline 100 mg; one tablet with breakfast Simvastatin 20 mg; one tablet with dinner Valsartan/ HCTZ 160/25 mg; one tablet at breakfast Aspirin 81 mg; one tablet at breakfast Omeprazole 20 mg; one capsule at breakfast Allegra 180 mg: one tablet at breakfast BD Insulin Syringes 31G x 5/16" 1 ML: Patient request BD brand Semaglutide (Ozempic) 0.5 mg: inject 0.5 mg into skin one a week  Patient declined the following medication month due to PRN use/additional supply on hand. Freestyle Libre kit Sensor: Check blood sugar as directed Insulin NPH (Novolin N): 60 units at night getting from [facility] since brand is more cost effective Insulin R (Novolin R): 30-40 units  getting from [facility] since brand is more cost effective  Patient is due for next adherence delivery on: 08/13/2020. Called patient and reviewed medications and coordinated delivery. This delivery to include: Amlodipine 10 mg; one tablet at breakfast Metformin 1000 mg; two tablets at breakfast Sertraline 100 mg; one tablet with  breakfast Simvastatin 20 mg; one tablet with dinner Valsartan/ HCTZ 160/25 mg; one tablet at breakfast Aspirin 81 mg; one tablet at breakfast Omeprazole 20 mg; one capsule at breakfast Allegra 180 mg: one tablet at breakfast BD Insulin Syringes 31G x 5/16" 1 ML: Patient request BD brand OneTouch Verio test strips; use to check blood sugar three times daily  Patient declined the following medications due to the following reason Freestyle [employer]: Check blood sugar as directed Insulin NPH (Novolin N): 60 units at night getting from [facility] since brand is more cost effective Insulin R (Novolin R): 30-40 units getting from [place] since brand is more cost effective Semaglutide (Ozempic) 1 mg: inject 1 mg into skin one a week (PAP)  Patient needs refills for the following medication. Sertraline 100 mg; one tablet with breakfast Simvastatin 20 mg; one tablet with dinner Valsartan/ HCTZ 160/25 mg; one tablet at breakfast Omeprazole 20 mg: one capsule at breakfast  Confirmed delivery date of 07.25.2022, advised patient that pharmacy will contact them the morning of delivery.    Care Gaps: Zoster Vaccine COVID-19 Foot Exam Eye Exam  Star Rating Drugs: Medication Dispensed Quantity Pharmacy  Simvastatin 20 mg 04.22.2022 [facility]  Valsartan/ HCTZ 160/25 mg 04.22.2022 [facility]  Metformin 1000 mg 04.21.2022 [facility]   Amilia Revonda Standard, [facility] Pharmacist Assistant [phone]

## 2020-08-07 ENCOUNTER — Inpatient Hospital Stay: Payer: PPO | Admitting: Family

## 2020-08-07 ENCOUNTER — Inpatient Hospital Stay: Payer: PPO | Attending: Hematology & Oncology

## 2020-08-07 ENCOUNTER — Other Ambulatory Visit: Payer: Self-pay | Admitting: Internal Medicine

## 2020-08-07 ENCOUNTER — Other Ambulatory Visit: Payer: Self-pay | Admitting: Adult Health

## 2020-08-07 ENCOUNTER — Encounter: Payer: Self-pay | Admitting: Family

## 2020-08-07 ENCOUNTER — Other Ambulatory Visit: Payer: Self-pay | Admitting: Family

## 2020-08-07 ENCOUNTER — Other Ambulatory Visit: Payer: Self-pay

## 2020-08-07 VITALS — BP 129/63 | HR 89 | Temp 98.0°F | Resp 20 | Ht 68.5 in | Wt 308.1 lb

## 2020-08-07 DIAGNOSIS — D696 Thrombocytopenia, unspecified: Secondary | ICD-10-CM

## 2020-08-07 DIAGNOSIS — Z7982 Long term (current) use of aspirin: Secondary | ICD-10-CM | POA: Diagnosis not present

## 2020-08-07 DIAGNOSIS — I10 Essential (primary) hypertension: Secondary | ICD-10-CM

## 2020-08-07 DIAGNOSIS — Z794 Long term (current) use of insulin: Secondary | ICD-10-CM | POA: Insufficient documentation

## 2020-08-07 DIAGNOSIS — F339 Major depressive disorder, recurrent, unspecified: Secondary | ICD-10-CM

## 2020-08-07 DIAGNOSIS — K760 Fatty (change of) liver, not elsewhere classified: Secondary | ICD-10-CM | POA: Insufficient documentation

## 2020-08-07 DIAGNOSIS — E1165 Type 2 diabetes mellitus with hyperglycemia: Secondary | ICD-10-CM | POA: Insufficient documentation

## 2020-08-07 DIAGNOSIS — Z79899 Other long term (current) drug therapy: Secondary | ICD-10-CM | POA: Diagnosis not present

## 2020-08-07 LAB — CMP (CANCER CENTER ONLY)
ALT: 31 U/L (ref 0–44)
AST: 24 U/L (ref 15–41)
Albumin: 4 g/dL (ref 3.5–5.0)
Alkaline Phosphatase: 108 U/L (ref 38–126)
Anion gap: 12 (ref 5–15)
BUN: 19 mg/dL (ref 8–23)
CO2: 24 mmol/L (ref 22–32)
Calcium: 9.9 mg/dL (ref 8.9–10.3)
Chloride: 102 mmol/L (ref 98–111)
Creatinine: 0.79 mg/dL (ref 0.61–1.24)
GFR, Estimated: >60 mL/min (ref >60)
Glucose, Bld: 137 mg/dL — ABNORMAL HIGH (ref 70–99)
Potassium: 3.9 mmol/L (ref 3.5–5.1)
Sodium: 138 mmol/L (ref 135–145)
Total Bilirubin: 0.5 mg/dL (ref 0.3–1.2)
Total Protein: 7.4 g/dL (ref 6.5–8.1)

## 2020-08-07 LAB — LACTATE DEHYDROGENASE: LDH: 112 U/L (ref 98–192)

## 2020-08-07 LAB — CBC WITH DIFFERENTIAL (CANCER CENTER ONLY)
Abs Immature Granulocytes: 0.02 10*3/uL (ref 0.00–0.07)
Basophils Absolute: 0 10*3/uL (ref 0.0–0.1)
Basophils Relative: 0 %
Eosinophils Absolute: 0.1 10*3/uL (ref 0.0–0.5)
Eosinophils Relative: 2 %
HCT: 39.4 % (ref 39.0–52.0)
Hemoglobin: 13.9 g/dL (ref 13.0–17.0)
Immature Granulocytes: 0 %
Lymphocytes Relative: 22 %
Lymphs Abs: 1.5 10*3/uL (ref 0.7–4.0)
MCH: 34.2 pg — ABNORMAL HIGH (ref 26.0–34.0)
MCHC: 35.3 g/dL (ref 30.0–36.0)
MCV: 97 fL (ref 80.0–100.0)
Monocytes Absolute: 0.5 10*3/uL (ref 0.1–1.0)
Monocytes Relative: 7 %
Neutro Abs: 4.6 10*3/uL (ref 1.7–7.7)
Neutrophils Relative %: 69 %
Platelet Count: 100 10*3/uL — ABNORMAL LOW (ref 150–400)
RBC: 4.06 MIL/uL — ABNORMAL LOW (ref 4.22–5.81)
RDW: 13.2 % (ref 11.5–15.5)
WBC Count: 6.7 10*3/uL (ref 4.0–10.5)
nRBC: 0 % (ref 0.0–0.2)

## 2020-08-07 LAB — APTT: aPTT: 28 seconds (ref 24–36)

## 2020-08-07 LAB — SAVE SMEAR(SSMR), FOR PROVIDER SLIDE REVIEW

## 2020-08-07 LAB — PROTIME-INR
INR: 1 (ref 0.8–1.2)
Prothrombin Time: 13.5 seconds (ref 11.4–15.2)

## 2020-08-07 LAB — PLATELET BY CITRATE

## 2020-08-07 NOTE — Progress Notes (Signed)
Hematology and Oncology Follow Up Visit  Billy Hale [id number] 1950-03-10 70 y.o. 08/07/2020   Principle Diagnosis:  Thrombocytopenia - uncontrolled diabetes and hepatic steatosis   Current Therapy:   Observation    Interim History:  Billy Hale is here today with his wife for follow-up. He needs to have several teeth extracted along with some other dental work done and his dentists is requesting clearance.  He is doing well and has not had any issue with bleeding, bruising or petechiae.  Platelet count today is 100, Hgb 13.9, MCV 97 and WBC count 6.7.  He has some SOB with over exertion. He takes a break to rest as needed.  No fever, chills, n/v, cough, rash, dizziness, chest pain, palpitations, abdominal pain or changes in bowel or bladder habits.  No swelling, tenderness, numbness or tingling in his extremities at this time.  No falls or syncope.  He has a good appetite and is staying well hydrated. His weight is stable at 308 lbs.   ECOG Performance Status: 0 - Asymptomatic  Medications:  Allergies as of 08/07/2020   No Known Allergies      Medication List        Accurate as of August 07, 2020  2:27 PM. If you have any questions, ask your nurse or doctor.          amLODipine 10 MG tablet Commonly known as: NORVASC Take 1 tablet (10 mg total) by mouth every morning.   aspirin EC 81 MG tablet Take 81 mg by mouth daily.   BD Insulin Syringe U/F 31G X 5/16" 1 ML Misc Generic drug: Insulin Syringe-Needle U-100 USE THREE TIMES DAILY BEFORE MEALS   fexofenadine 180 MG tablet Commonly known as: ALLEGRA Take 180 mg by mouth daily.   Fiasp FlexTouch 100 UNIT/ML FlexTouch Pen Generic drug: insulin aspart Inject 20-40 units 3x a day before meals, under skin   FreeStyle Libre 2 Sensor Misc 1 each by Does not apply route every 14 (fourteen) days.   insulin NPH Human 100 UNIT/ML injection Commonly known as: NovoLIN N ReliOn INJECT 60 UNITS SUBCUTANEOUSLY THREE TIMES  DAILY   insulin regular 100 units/mL injection Commonly known as: NovoLIN R ReliOn INJECT 30-40 UNITS SUBCUTANEOUSLY THREE TIMES DAILY   metFORMIN 1000 MG tablet Commonly known as: GLUCOPHAGE Take 1 tablet (1,000 mg total) by mouth 2 (two) times daily with a meal.   omeprazole 20 MG capsule Commonly known as: PRILOSEC TAKE ONE CAPSULE BY MOUTH EVERY MORNING   OneTouch Verio test strip Generic drug: glucose blood USE AS DIRECTED 3 TIMES A DAY   Ozempic (0.25 or 0.5 MG/DOSE) 2 MG/1.5ML Sopn Generic drug: Semaglutide(0.25 or 0.5MG /DOS) Inject 0.5 mg into the skin once a week.   sertraline 100 MG tablet Commonly known as: ZOLOFT TAKE ONE TABLET BY MOUTH EVERY MORNING   simvastatin 20 MG tablet Commonly known as: ZOCOR TAKE ONE TABLET BY MOUTH EVERY EVENING   Tresiba FlexTouch 200 UNIT/ML FlexTouch Pen Generic drug: insulin degludec Use 100-140 units under skin daily   valsartan-hydrochlorothiazide 160-25 MG tablet Commonly known as: DIOVAN-HCT TAKE ONE TABLET BY MOUTH EVERY MORNING        Allergies: No Known Allergies  Past Medical History, Surgical history, Social history, and Family History were reviewed and updated.  Review of Systems: All other 10 point review of systems is negative.   Physical Exam:  height is 5' 8.5" (1.74 m) and weight is 308 lb 1.9 oz (139.8 kg) (abnormal). His oral temperature  is 98 F (36.7 C). His blood pressure is 129/63 and his pulse is 89. His respiration is 20 and oxygen saturation is 99%.   Wt Readings from Last 3 Encounters:  08/07/20 (!) 308 lb 1.9 oz (139.8 kg)  07/19/20 (!) 306 lb (138.8 kg)  04/30/20 (!) 308 lb 12.8 oz (140.1 kg)    Ocular: Sclerae unicteric, pupils equal, round and reactive to light Ear-nose-throat: Oropharynx clear, dentition fair Lymphatic: No cervical or supraclavicular adenopathy Lungs no rales or rhonchi, good excursion bilaterally Heart regular rate and rhythm, no murmur appreciated Abd soft,  nontender, positive bowel sounds MSK no focal spinal tenderness, no joint edema Neuro: non-focal, well-oriented, appropriate affect Breasts: Deferred   Lab Results  Component Value Date   WBC 6.7 08/07/2020   HGB 13.9 08/07/2020   HCT 39.4 08/07/2020   MCV 97.0 08/07/2020   PLT 100 (L) 08/07/2020   Lab Results  Component Value Date   FERRITIN 394.0 (H) 09/23/2012   IRON 86 09/23/2012   IRONPCTSAT 26.0 09/23/2012   Lab Results  Component Value Date   RBC 4.06 (L) 08/07/2020   No results found for: KPAFRELGTCHN, LAMBDASER, KAPLAMBRATIO No results found for: IGGSERUM, IGA, IGMSERUM No results found for: Dorene Ar, A1GS, A2GS, Colin Benton, MSPIKE, SPEI   Chemistry      Component Value Date/Time   NA 138 08/07/2020 1350   K 3.9 08/07/2020 1350   CL 102 08/07/2020 1350   CO2 24 08/07/2020 1350   BUN 19 08/07/2020 1350   CREATININE 0.79 08/07/2020 1350   CREATININE 0.84 01/06/2018 0921      Component Value Date/Time   CALCIUM 9.9 08/07/2020 1350   ALKPHOS 108 08/07/2020 1350   AST 24 08/07/2020 1350   ALT 31 08/07/2020 1350   BILITOT 0.5 08/07/2020 1350       Impression and Plan: Billy Hale is a very pleasant 70 yo caucasian gentleman with history of thrombocytopenia. He also has history of poorly controlled diabetes and hepatic steatosis.  PTT and PT/INR are within normal limits. His platelets are up to 100, Hgb 13.9 and WBC count 6.7.  We filled out and faxed his surgical clearance form to his dentist.   He plans to have his CBC and CMP drawn with his PCP prior to his next visit.  Follow-up in 4 months.  He can contact our office with any questions or concerns.   Emeline Gins, NP 7/19/20222:27 PM

## 2020-08-08 ENCOUNTER — Encounter: Payer: Self-pay | Admitting: Internal Medicine

## 2020-08-08 ENCOUNTER — Telehealth: Payer: Self-pay | Admitting: *Deleted

## 2020-08-08 NOTE — Telephone Encounter (Signed)
Per 07/1920 los - spoke to wife and gave upcoming appointment - confirmed - mailed calendar

## 2020-08-09 ENCOUNTER — Encounter: Payer: Self-pay | Admitting: *Deleted

## 2020-08-22 ENCOUNTER — Telehealth: Payer: Self-pay

## 2020-08-22 NOTE — Telephone Encounter (Signed)
Billy Hale pt's wife came by on 08/22/2020 at 4:45pm to pick up medication

## 2020-08-22 NOTE — Telephone Encounter (Signed)
Called and advised pt's wife Billy Hale patient assistance Billy Hale (7 boxes) and Billy Hale (10 boxes) were delivered to the office and is ready for pick up.

## 2020-08-29 NOTE — Telephone Encounter (Signed)
Pt came by the office 08/29/2020 at 4:35pm to pick up medication

## 2020-08-31 ENCOUNTER — Ambulatory Visit: Payer: PPO | Admitting: Family

## 2020-08-31 ENCOUNTER — Other Ambulatory Visit: Payer: PPO

## 2020-09-05 ENCOUNTER — Other Ambulatory Visit: Payer: PPO

## 2020-09-05 ENCOUNTER — Ambulatory Visit: Payer: PPO | Admitting: Family

## 2020-09-13 ENCOUNTER — Encounter: Payer: Self-pay | Admitting: Internal Medicine

## 2020-09-25 ENCOUNTER — Telehealth: Payer: Self-pay | Admitting: Adult Health

## 2020-09-25 NOTE — Telephone Encounter (Signed)
Left message for patient to call back and schedule Medicare Annual Wellness Visit (AWV) either virtually or in office. Left  my Zachery Conch number [phone]   Last AWVI 11/07/19  please schedule at anytime with [facility] Nurse Health Advisor 1 or 2   This should be a 45 minute visit.   [employer] ins can do calendar year

## 2020-09-27 NOTE — Telephone Encounter (Signed)
Na

## 2020-10-03 ENCOUNTER — Other Ambulatory Visit: Payer: Self-pay

## 2020-10-03 ENCOUNTER — Ambulatory Visit (INDEPENDENT_AMBULATORY_CARE_PROVIDER_SITE_OTHER): Payer: PPO

## 2020-10-03 DIAGNOSIS — Z0000 Encounter for general adult medical examination without abnormal findings: Secondary | ICD-10-CM | POA: Diagnosis not present
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
## 2020-10-03 NOTE — Patient Instructions (Signed)
Billy Hale , Thank you for taking time to come for your Medicare Wellness Visit. I appreciate your ongoing commitment to your health goals. Please review the following plan we discussed and let me know if I can assist you in the future.   Screening recommendations/referrals: Colonoscopy: pt stated he completed cologuard Recommended yearly ophthalmology/optometry visit for glaucoma screening and checkup Recommended yearly dental visit for hygiene and checkup  Vaccinations: Influenza vaccine: Due Pneumococcal vaccine: Declined and discussed Tdap vaccine: Done 11/19/14 repeat every 10 years due 11/18/24 Shingles vaccine: Shingrix discussed. Please contact your pharmacy for coverage information.    Covid-19: Completed 4/8/ & 05/23/19   Advanced directives: Please bring a copy of your health care power of attorney and living will to the office at your convenience.  Conditions/risks identified: lose weight   Next appointment: Follow up in one year for your annual wellness visit.   Preventive Care 12 Years and Older, Male Preventive care refers to lifestyle choices and visits with your health care provider that can promote health and wellness. What does preventive care include? A yearly physical exam. This is also called an annual well check. Dental exams once or twice a year. Routine eye exams. Ask your health care provider how often you should have your eyes checked. Personal lifestyle choices, including: Daily care of your teeth and gums. Regular physical activity. Eating a healthy diet. Avoiding tobacco and drug use. Limiting alcohol use. Practicing safe sex. Taking low doses of aspirin every day. Taking vitamin and mineral supplements as recommended by your health care provider. What happens during an annual well check? The services and screenings done by your health care provider during your annual well check will depend on your age, overall health, lifestyle risk factors, and family  history of disease. Counseling  Your health care provider may ask you questions about your: Alcohol use. Tobacco use. Drug use. Emotional well-being. Home and relationship well-being. Sexual activity. Eating habits. History of falls. Memory and ability to understand (cognition). Work and work Astronomer. Screening  You may have the following tests or measurements: Height, weight, and BMI. Blood pressure. Lipid and cholesterol levels. These may be checked every 5 years, or more frequently if you are over 17 years old. Skin check. Lung cancer screening. You may have this screening every year starting at age 71 if you have a 30-pack-year history of smoking and currently smoke or have quit within the past 15 years. Fecal occult blood test (FOBT) of the stool. You may have this test every year starting at age 34. Flexible sigmoidoscopy or colonoscopy. You may have a sigmoidoscopy every 5 years or a colonoscopy every 10 years starting at age 28. Prostate cancer screening. Recommendations will vary depending on your family history and other risks. Hepatitis C blood test. Hepatitis B blood test. Sexually transmitted disease (STD) testing. Diabetes screening. This is done by checking your blood sugar (glucose) after you have not eaten for a while (fasting). You may have this done every 1-3 years. Abdominal aortic aneurysm (AAA) screening. You may need this if you are a current or former smoker. Osteoporosis. You may be screened starting at age 71 if you are at high risk. Talk with your health care provider about your test results, treatment options, and if necessary, the need for more tests. Vaccines  Your health care provider may recommend certain vaccines, such as: Influenza vaccine. This is recommended every year. Tetanus, diphtheria, and acellular pertussis (Tdap, Td) vaccine. You may need a Td booster  every 10 years. Zoster vaccine. You may need this after age 66. Pneumococcal  13-valent conjugate (PCV13) vaccine. One dose is recommended after age 71. Pneumococcal polysaccharide (PPSV23) vaccine. One dose is recommended after age 7. Talk to your health care provider about which screenings and vaccines you need and how often you need them. This information is not intended to replace advice given to you by your health care provider. Make sure you discuss any questions you have with your health care provider. Document Released: 02/02/2015 Document Revised: 09/26/2015 Document Reviewed: 11/07/2014 Elsevier Interactive Patient Education  2017 [place] Prevention in the Home Falls can cause injuries. They can happen to people of all ages. There are many things you can do to make your home safe and to help prevent falls. What can I do on the outside of my home? Regularly fix the edges of walkways and driveways and fix any cracks. Remove anything that might make you trip as you walk through a door, such as a raised step or threshold. Trim any bushes or trees on the path to your home. Use bright outdoor lighting. Clear any walking paths of anything that might make someone trip, such as rocks or tools. Regularly check to see if handrails are loose or broken. Make sure that both sides of any steps have handrails. Any raised decks and porches should have guardrails on the edges. Have any leaves, snow, or ice cleared regularly. Use sand or salt on walking paths during winter. Clean up any spills in your garage right away. This includes oil or grease spills. What can I do in the bathroom? Use night lights. Install grab bars by the toilet and in the tub and shower. Do not use towel bars as grab bars. Use non-skid mats or decals in the tub or shower. If you need to sit down in the shower, use a plastic, non-slip stool. Keep the floor dry. Clean up any water that spills on the floor as soon as it happens. Remove soap buildup in the tub or shower regularly. Attach  bath mats securely with double-sided non-slip rug tape. Do not have throw rugs and other things on the floor that can make you trip. What can I do in the bedroom? Use night lights. Make sure that you have a light by your bed that is easy to reach. Do not use any sheets or blankets that are too big for your bed. They should not hang down onto the floor. Have a firm chair that has side arms. You can use this for support while you get dressed. Do not have throw rugs and other things on the floor that can make you trip. What can I do in the kitchen? Clean up any spills right away. Avoid walking on wet floors. Keep items that you use a lot in easy-to-reach places. If you need to reach something above you, use a strong step stool that has a grab bar. Keep electrical cords out of the way. Do not use floor polish or wax that makes floors slippery. If you must use wax, use non-skid floor wax. Do not have throw rugs and other things on the floor that can make you trip. What can I do with my stairs? Do not leave any items on the stairs. Make sure that there are handrails on both sides of the stairs and use them. Fix handrails that are broken or loose. Make sure that handrails are as long as the stairways. Check any carpeting to  make sure that it is firmly attached to the stairs. Fix any carpet that is loose or worn. Avoid having throw rugs at the top or bottom of the stairs. If you do have throw rugs, attach them to the floor with carpet tape. Make sure that you have a light switch at the top of the stairs and the bottom of the stairs. If you do not have them, ask someone to add them for you. What else can I do to help prevent falls? Wear shoes that: Do not have high heels. Have rubber bottoms. Are comfortable and fit you well. Are closed at the toe. Do not wear sandals. If you use a stepladder: Make sure that it is fully opened. Do not climb a closed stepladder. Make sure that both sides of the  stepladder are locked into place. Ask someone to hold it for you, if possible. Clearly mark and make sure that you can see: Any grab bars or handrails. First and last steps. Where the edge of each step is. Use tools that help you move around (mobility aids) if they are needed. These include: Canes. Walkers. Scooters. Crutches. Turn on the lights when you go into a dark area. Replace any light bulbs as soon as they burn out. Set up your furniture so you have a clear path. Avoid moving your furniture around. If any of your floors are uneven, fix them. If there are any pets around you, be aware of where they are. Review your medicines with your doctor. Some medicines can make you feel dizzy. This can increase your chance of falling. Ask your doctor what other things that you can do to help prevent falls. This information is not intended to replace advice given to you by your health care provider. Make sure you discuss any questions you have with your health care provider. Document Released: 11/02/2008 Document Revised: 06/14/2015 Document Reviewed: 02/10/2014 Elsevier Interactive Patient Education  2017 [employer].

## 2020-10-09 ENCOUNTER — Encounter: Payer: Self-pay | Admitting: Internal Medicine

## 2020-10-26 ENCOUNTER — Other Ambulatory Visit: Payer: Self-pay | Admitting: Adult Health

## 2020-10-26 DIAGNOSIS — I10 Essential (primary) hypertension: Secondary | ICD-10-CM

## 2020-10-26 DIAGNOSIS — F339 Major depressive disorder, recurrent, unspecified: Secondary | ICD-10-CM

## 2020-10-29 ENCOUNTER — Telehealth: Payer: Self-pay | Admitting: Pharmacist

## 2020-10-29 NOTE — Chronic Care Management (AMB) (Addendum)
Chronic Care Management Pharmacy Assistant   Name: Billy Hale  MRN: [id number] DOB: Mar 20, 1950  Reason for Encounter: Disease State and Medication Review   Conditions to be addressed/monitored: HTN and DMII  Recent office visits:  None.  Recent consult visits:  08/07/20 Laverna Peace NP (Oncology) - seen for thrombocytopenia. No medication changes. Follow up in 4 months.   Hospital visits:  None in previous 6 months  Medications: Outpatient Encounter Medications as of 10/29/2020  Medication Sig   amLODipine (NORVASC) 10 MG tablet TAKE ONE TABLET BY MOUTH EVERY MORNING   aspirin EC 81 MG tablet Take 81 mg by mouth daily.   BD INSULIN SYRINGE U/F 31G X 5/16" 1 ML MISC USE THREE TIMES DAILY BEFORE MEALS   Continuous Blood Gluc Sensor (FREESTYLE LIBRE 2 SENSOR) MISC 1 each by Does not apply route every 14 (fourteen) days.   fexofenadine (ALLEGRA) 180 MG tablet Take 180 mg by mouth daily.   glucose blood (ONETOUCH VERIO) test strip USE TO check blood sugar THREE TIMES DAILY AS DIRECTED   insulin aspart (FIASP FLEXTOUCH) 100 UNIT/ML FlexTouch Pen Inject 20-40 units 3x a day before meals, under skin   insulin degludec (TRESIBA FLEXTOUCH) 200 UNIT/ML FlexTouch Pen Use 100-140 units under skin daily   metFORMIN (GLUCOPHAGE) 1000 MG tablet Take 1 tablet (1,000 mg total) by mouth 2 (two) times daily with a meal.   omeprazole (PRILOSEC) 20 MG capsule TAKE ONE CAPSULE BY MOUTH EVERY MORNING   Semaglutide,0.25 or 0.5MG/DOS, (OZEMPIC, 0.25 OR 0.5 MG/DOSE,) 2 MG/1.5ML SOPN Inject 0.5 mg into the skin once a week.   sertraline (ZOLOFT) 100 MG tablet TAKE ONE TABLET BY MOUTH EVERY MORNING   simvastatin (ZOCOR) 20 MG tablet TAKE ONE TABLET BY MOUTH EVERY EVENING   valsartan-hydrochlorothiazide (DIOVAN-HCT) 160-25 MG tablet TAKE ONE TABLET BY MOUTH EVERY MORNING   No facility-administered encounter medications on file as of 10/29/2020.   Fill History: OneTouch Verio test strips 08/08/2020  30   NOVOLIN N RELION 100UNIT/ML SUSPENSION 07/20/2020 83   BD Insulin Syringe Ultra-Fine 1 mL 31 gauge x 5/16" 08/07/2020 90   omeprazole 20 mg capsule,delayed release 08/07/2020 90   Ozempic 0.25 mg or 0.5 mg (2 mg/1.5 mL) subcutaneous pen injector 05/11/2020 90   sertraline 100 mg tablet 08/07/2020 90   simvastatin 20 mg tablet 08/07/2020 90   valsartan 160 mg-hydrochlorothiazide 25 mg tablet 08/07/2020 90   amlodipine 10 mg tablet 08/07/2020 90   metformin 1,000 mg tablet 08/07/2020 90   Recent Relevant Labs: Lab Results  Component Value Date/Time   HGBA1C 5.3 07/19/2020 11:53 AM   HGBA1C 6.4 (A) 03/15/2020 11:09 AM   HGBA1C 8.6 (H) 01/23/2017 09:46 AM   HGBA1C 8.1 (H) 04/20/2015 09:02 AM   MICROALBUR 61.9 (H) 01/06/2018 09:21 AM   MICROALBUR 43.0 (H) 04/20/2015 09:02 AM    Kidney Function Lab Results  Component Value Date/Time   CREATININE 0.79 08/07/2020 01:50 PM   CREATININE 0.83 04/30/2020 09:16 AM   CREATININE 0.84 01/06/2018 09:21 AM   CREATININE 0.79 06/25/2012 05:03 PM   GFR 91.91 02/24/2020 10:57 AM   GFRNONAA >60 08/07/2020 01:50 PM   GFRNONAA 90 01/06/2018 09:21 AM   GFRAA 104 01/06/2018 09:21 AM    Current antihyperglycemic regimen:  Fiasp flextouch 100unit/ml - Inject 20-40 units 3x a day before meals, under skin Tresiba flextouch 200unit/ml - Use 100-140 units under skin daily Metformin 1062m - Take 1 tablet (1,000 mg total) by mouth 2 (two) times  daily with a meal.  Semaglutide - Inject 0.5 mg into the skin once a week.  What recent interventions/DTPs have been made to improve glycemic control:  None.  Have there been any recent hospitalizations or ED visits since last visit with CPP? No longer on novolin insulin.  Patient reports hypoglycemic symptoms, including Pale, Sweaty, and Shaky Patient denies hyperglycemic symptoms, including none How often are you checking your blood sugar? twice daily What are your blood sugars ranging?  Fasting:   After meals:  During the week, how often does your blood glucose drop below 70?  About a month ago patient dropped low into the 50s.  Are you checking your feet daily/regularly?   Adherence Review: Is the patient currently on a STATIN medication? Yes Is the patient currently on ACE/ARB medication? Yes Does the patient have >5 day gap between last estimated fill dates? No   Reviewed chart prior to disease state call. Spoke with patient regarding BP  Recent Office Vitals: BP Readings from Last 3 Encounters:  08/07/20 129/63  07/19/20 124/76  04/30/20 (!) 126/57   Pulse Readings from Last 3 Encounters:  08/07/20 89  07/19/20 88  04/30/20 85    Wt Readings from Last 3 Encounters:  08/07/20 (!) 308 lb 1.9 oz (139.8 kg)  07/19/20 (!) 306 lb (138.8 kg)  04/30/20 (!) 308 lb 12.8 oz (140.1 kg)     Kidney Function Lab Results  Component Value Date/Time   CREATININE 0.79 08/07/2020 01:50 PM   CREATININE 0.83 04/30/2020 09:16 AM   CREATININE 0.84 01/06/2018 09:21 AM   CREATININE 0.79 06/25/2012 05:03 PM   GFR 91.91 02/24/2020 10:57 AM   GFRNONAA >60 08/07/2020 01:50 PM   GFRNONAA 90 01/06/2018 09:21 AM   GFRAA 104 01/06/2018 09:21 AM    BMP Latest Ref Rng & Units 08/07/2020 04/30/2020 02/24/2020  Glucose 70 - 99 mg/dL 137(H) 157(H) 110(H)  BUN 8 - 23 mg/dL 19 15 18   Creatinine 0.61 - 1.24 mg/dL 0.79 0.83 0.74  BUN/Creat Ratio 6 - 22 (calc) - - -  Sodium 135 - 145 mmol/L 138 137 139  Potassium 3.5 - 5.1 mmol/L 3.9 3.6 3.7  Chloride 98 - 111 mmol/L 102 102 104  CO2 22 - 32 mmol/L 24 26 25   Calcium 8.9 - 10.3 mg/dL 9.9 9.6 9.4    Current antihypertensive regimen:  Valsartan-hydrochlorothiazide 160-32m - take 1 tablet by mouth every morning. Amlodipine 130m- take 1 tablet by mouth every morning.  How often are you checking your Blood Pressure?  An at home cuff does not fit patients arm. Current home BP readings: None to report.  What recent interventions/DTPs have been made  by any provider to improve Blood Pressure control since last CPP Visit: None. Any recent hospitalizations or ED visits since last visit with CPP? No  Adherence Review: Is the patient currently on ACE/ARB medication? Yes Does the patient have >5 day gap between last estimated fill dates? No   BP Readings from Last 3 Encounters:  08/07/20 129/63  07/19/20 124/76  04/30/20 (!) 126/57    Lab Results  Component Value Date   HGBA1C 5.3 07/19/2020     Patient obtains medications through Adherence Packaging  90 Days   Last adherence delivery included:  Amlodipine 10 mg; one tablet at breakfast Metformin 1000 mg; two tablets at breakfast Sertraline 100 mg; one tablet with breakfast Simvastatin 20 mg; one tablet with dinner Valsartan/ HCTZ 160/25 mg; one tablet at breakfast Aspirin 81 mg;  one tablet at breakfast Omeprazole 20 mg; one capsule at breakfast Allegra 180 mg: one tablet at breakfast BD Insulin Syringes 31G x 5/16" 1 ML: Patient request BD brand OneTouch Verio test strips; use to check blood sugar three times daily  Patient declined (meds) last month due to PRN use/additional supply on hand. Freestyle Libre kit Sensor: Check blood sugar as directed Insulin NPH (Novolin N): 60 units at night getting from [facility] since brand is more cost effective Insulin R (Novolin R): 30-40 units getting from [facility] since brand is more cost effective Semaglutide (Ozempic) 1 mg: inject 1 mg into skin one a week (PAP)  Patient is due for next adherence delivery on: 11/08/20. Called patient and reviewed medications and coordinated delivery.  This delivery to include: Amlodipine 10 mg; one tablet at breakfast Metformin 1000 mg; two tablets at breakfast Sertraline 100 mg; one tablet with breakfast Simvastatin 20 mg; one tablet with dinner Valsartan/ HCTZ 160/25 mg; one tablet at breakfast Aspirin 81 mg; one tablet at breakfast Omeprazole 20 mg; one capsule at breakfast Allegra 180 mg: one  tablet at breakfast OneTouch Verio test strips; use to check blood sugar three times daily   Patient declined the following medications: BD Insulin Syringes 31G x 5/16" 1 ML: Patient request BD brand (PAP) Freestyle Libre kit Sensor: Check blood sugar as directed (not using) Semaglutide (Ozempic) 1 mg: inject 1 mg into skin one a week (PAP) Fiasp flextouch 100unit/ml - Inject 20-40 units 3x a day before meals, under skin (PAP) Tyler Aas flextouch 200unit/ml - Use 100-140 units under skin daily (PAP)  Confirmed delivery date of 11/08/20, advised patient that pharmacy will contact them the morning of delivery.   Notes: Spoke with patients wife Suanne Marker who stated that patient is taking all medications as prescribed. No issues with medications at this time. Patient eats toast with coffee in the mornings and depending on what he has to do that day he sometimes cooks a big breakfast. Patient eats mostly red meat. Patient usually skips lunch and fasts quite a bit. He has been approved by his pcp to practice fasting. He does tend to have a steak or pork with a vegetable, starch and bread when he does eat dinner. Patient drinks plenty of water and tea per Suanne Marker. He works on cars and motorcycles for activity. Suanne Marker will call me back with exact blood sugar readings. She does not have his meter with her currently as patient keeps it with him. Patient is unable to check blood pressure at home due to a cuff not available to fit his arm with home monitors. He has to have a thigh cuff. Regular reads him too high. Patients blood pressure has been normal at doctors visits. Suanne Marker thanked me for my call.   Care Gaps:  AWV - completed on 10/03/20 Zoster vaccines - never done Covid-19 vaccine booster 3 - overdue since 06/20/19 Colonoscopy - overdue since 07/12/19 Foot exam - overdue since 1/22 Ophthalmology exam - overdue since 05/17/20 Last PCP BP: 130/70 ON 02/24/20  Star Rating Drugs:  Metformin 101m - last  filled on 08/07/20 90DS at [facility] Semaglutide - last filled on 05/11/20 90DS patient received through Patient assistance Simvastatin 239m- last filled on 08/07/20 90DS at [facility] Valsartan-hydrchlorothiazide 160-2570m last filled on 08/07/20 [phone]

## 2020-11-09 ENCOUNTER — Encounter: Payer: Self-pay | Admitting: Internal Medicine

## 2020-11-20 ENCOUNTER — Ambulatory Visit: Payer: PPO | Admitting: Internal Medicine

## 2020-11-20 ENCOUNTER — Encounter: Payer: Self-pay | Admitting: Internal Medicine

## 2020-11-20 ENCOUNTER — Other Ambulatory Visit: Payer: Self-pay

## 2020-11-20 VITALS — BP 138/78 | HR 83 | Ht 68.5 in | Wt 304.0 lb

## 2020-11-20 DIAGNOSIS — E1165 Type 2 diabetes mellitus with hyperglycemia: Secondary | ICD-10-CM | POA: Diagnosis not present

## 2020-11-20 DIAGNOSIS — E038 Other specified hypothyroidism: Secondary | ICD-10-CM

## 2020-11-20 DIAGNOSIS — Z794 Long term (current) use of insulin: Secondary | ICD-10-CM

## 2020-11-20 DIAGNOSIS — E782 Mixed hyperlipidemia: Secondary | ICD-10-CM

## 2020-11-20 DIAGNOSIS — E669 Obesity, unspecified: Secondary | ICD-10-CM

## 2020-11-20 LAB — POCT GLYCOSYLATED HEMOGLOBIN (HGB A1C): Hemoglobin A1C: 5.5 % (ref 4.0–5.6)

## 2020-11-20 MED ORDER — OZEMPIC (2 MG/DOSE) 8 MG/3ML ~~LOC~~ SOPN
2.0000 mg | PEN_INJECTOR | SUBCUTANEOUS | 3 refills | Status: DC
Start: 2020-11-20 — End: 2021-08-07
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
## 2020-11-20 NOTE — Patient Instructions (Addendum)
Please continue: - Metformin 1000 mg 2x a day - Tresiba 106 mg daily - FiAsp 25-40 units before meals  Please increase: - Ozempic 2 mg weekly  Please return in 4 months with your sugar log.

## 2020-12-05 ENCOUNTER — Telehealth: Payer: Self-pay | Admitting: Pharmacist

## 2020-12-05 NOTE — Chronic Care Management (AMB) (Signed)
    Chronic Care Management Pharmacy Assistant   Name: Billy Hale  MRN: [id number] DOB: 18-Dec-1950   Reason for Encounter: Ozempic patient assistance renewal. Spoke with patients wife, she states Dr. Elvera Lennox is changing the dose and is renewing the Ozempic with patient assistance.     Care Gaps: AWV - completed on 10/03/20 Zoster vaccines - never done Covid-19 vaccine - overdue Colonoscopy - overdue  Foot exam - overdue Ophthalmology exam - overdue  Last PCP BP: 138/78 on 11/20/2020 Last A1C - 5.5 on 11/20/2020  Star Rating Drugs: Metformin 1000mg  - last filled on 11/01/2020 90DS at [facility] Semaglutide - filled patient received through Patient assistance Simvastatin 20mg  - last filled on 11/01/2020 90DS at [facility] Valsartan-hydrchlorothiazide 160-25mg  - last filled on 11/01/2020 90DS at [facility]  11/03/2020 [facility]  Clinical Pharmacist Assistant [phone]

## 2020-12-07 ENCOUNTER — Ambulatory Visit: Payer: PPO | Admitting: Family

## 2020-12-17 ENCOUNTER — Encounter: Payer: Self-pay | Admitting: Internal Medicine

## 2020-12-18 ENCOUNTER — Telehealth: Payer: Self-pay

## 2020-12-18 NOTE — Telephone Encounter (Signed)
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  Paper work in Dr. Elvera Lennox box on 12/18/2020.

## 2020-12-18 NOTE — Telephone Encounter (Signed)
Application received and placed on provider's dest to be signed.

## 2020-12-19 ENCOUNTER — Encounter: Payer: Self-pay | Admitting: Internal Medicine

## 2020-12-25 ENCOUNTER — Telehealth: Payer: Self-pay | Admitting: Pharmacist

## 2020-12-25 NOTE — Chronic Care Management (AMB) (Signed)
    Chronic Care Management Pharmacy Assistant   Name: Billy Hale  MRN: [id number] DOB: 1950/04/30  12/26/2020 APPOINTMENT REMINDER  Billy Hale" wife was reminded to have all medications, supplements and any blood glucose and blood pressure readings available for review with [name], Pharm. D, at his telephone visit on 12/26/2020 at 11:00.   Questions: Have you had any recent office visit or specialist visit outside of [facility]? No  Are there any concerns you would like to discuss during your office visit? No  Are you having any problems obtaining your medications?  No  If patient has any PAP medications ask if they are having any problems getting their PAP medication or refill? Still pending turned in 3 weeks ago  Care Gaps: AWV - completed on 10/03/20 Zoster vaccines - never done Covid-19 vaccine - overdue Colonoscopy - overdue  Foot exam - overdue Ophthalmology exam - overdue  Last PCP BP: 138/78 on 11/20/2020 Last A1C - 5.5 on 11/20/2020  Star Rating Drug: Metformin 1000mg  - last filled on 11/01/2020 [facility] at [facility] Semaglutide - filled patient received through Patient assistance Simvastatin 20mg  - last filled on 11/01/2020 90[facility] at [facility] Valsartan-hydrchlorothiazide 160-25mg  - last filled on 11/01/2020 90[facility] at [facility]  Any gaps in medications fill history? No  11/03/2020 [facility]  Billy Hale [phone]

## 2020-12-26 ENCOUNTER — Ambulatory Visit (INDEPENDENT_AMBULATORY_CARE_PROVIDER_SITE_OTHER): Payer: PPO | Admitting: Pharmacist

## 2020-12-26 DIAGNOSIS — I10 Essential (primary) hypertension: Secondary | ICD-10-CM

## 2020-12-26 DIAGNOSIS — E1165 Type 2 diabetes mellitus with hyperglycemia: Secondary | ICD-10-CM

## 2020-12-26 DIAGNOSIS — Z794 Long term (current) use of insulin: Secondary | ICD-10-CM

## 2020-12-26 NOTE — Progress Notes (Signed)
Chronic Care Management Pharmacy Note  12/26/2020 Name:  Billy Hale MRN:  [id number] DOB:  May 08, 1950  Summary: A1c at goal of < 7% but BGs have been fluctuating  Recommendations/Changes made from today's visit: -Coordinated acute fill for [employer] -Follow up on submitted PAPs for Ozempic, Tresiba & [place] -Provided samples for Tresiba -Recommended purchasing a BP cuff to monitor at home  Plan: Follow up on submitted PAPs for Ozempic, Tyler Aas & Fiasp Follow up in 6 months  Subjective: Billy Hale is an 70 y.o. year old male who is a primary patient of Dorothyann Peng, NP.  The CCM team was consulted for assistance with disease management and care coordination needs.    Engaged with patient by telephone for follow up visit in response to provider referral for pharmacy case management and/or care coordination services.   Consent to Services:  The patient was given information about Chronic Care Management services, agreed to services, and gave verbal consent prior to initiation of services.  Please see initial visit note for detailed documentation.   Patient Care Team: Dorothyann Peng, NP as PCP - General (Family Medicine) Viona Gilmore, [facility] as Pharmacist (Pharmacist)  Recent office visits: 10/03/20 Charlott Rakes, LPN: Patient presented for AWV.  02/24/20 Dorothyann Peng, NP: Patient presented for annual exam.  Recent consult visits: 11/20/20 Philemon Kingdom, MD (endo): Patient presented for DM follow up. Increased to Ozempic 2 mg weekly. Follow up in 4 months.  07/19/20 Philemon Kingdom, MD (endo): Patient presented for DM follow up. Switched NPH and regular insulin to [place] 100-140 units per day and Fiasp 20-40 units TID.  08/07/20 Laverna Peace NP (Oncology) - seen for thrombocytopenia. No medication changes. Follow up in 4 months.   Hospital visits: None in previous 6 months   Objective:  Lab Results  Component Value Date   CREATININE 0.79  08/07/2020   BUN 19 08/07/2020   GFR 91.91 02/24/2020   GFRNONAA >60 08/07/2020   GFRAA 104 01/06/2018   NA 138 08/07/2020   K 3.9 08/07/2020   CALCIUM 9.9 08/07/2020   CO2 24 08/07/2020   GLUCOSE 137 (H) 08/07/2020    Lab Results  Component Value Date/Time   HGBA1C 5.5 11/20/2020 10:15 AM   HGBA1C 5.3 07/19/2020 11:53 AM   HGBA1C 8.6 (H) 01/23/2017 09:46 AM   HGBA1C 8.1 (H) 04/20/2015 09:02 AM   GFR 91.91 02/24/2020 10:57 AM   GFR 108.15 02/04/2019 09:43 AM   MICROALBUR 61.9 (H) 01/06/2018 09:21 AM   MICROALBUR 43.0 (H) 04/20/2015 09:02 AM    Last diabetic Eye exam:  Lab Results  Component Value Date/Time   HMDIABEYEEXA No Retinopathy 06/10/2017 12:00 AM    Last diabetic Foot exam:  Lab Results  Component Value Date/Time   HMDIABFOOTEX done 11/08/2010 12:00 AM     Lab Results  Component Value Date   CHOL 132 02/24/2020   HDL 45.20 02/24/2020   LDLCALC 63 02/24/2020   LDLDIRECT 72.9 11/01/2010   TRIG 119.0 02/24/2020   CHOLHDL 3 02/24/2020    Hepatic Function Latest Ref Rng & Units 08/07/2020 04/30/2020 02/24/2020  Total Protein 6.5 - 8.1 g/dL 7.4 7.3 7.5  Albumin 3.5 - 5.0 g/dL 4.0 4.2 4.1  AST 15 - 41 U/L 24 26 26   ALT 0 - 44 U/L 31 28 34  Alk Phosphatase 38 - 126 U/L 108 99 121(H)  Total Bilirubin 0.3 - 1.2 mg/dL 0.5 0.4 0.5  Bilirubin, Direct 0.0 - 0.3 mg/dL - - -  Lab Results  Component Value Date/Time   TSH 3.64 02/24/2020 10:57 AM   TSH 3.84 02/04/2019 09:43 AM   FREET4 0.64 02/04/2019 09:43 AM   FREET4 0.56 (L) 01/06/2018 09:21 AM    CBC Latest Ref Rng & Units 08/07/2020 04/30/2020 04/11/2020  WBC 4.0 - 10.5 K/uL 6.7 5.7 5.4  Hemoglobin 13.0 - 17.0 g/dL 13.9 14.4 14.1  Hematocrit 39.0 - 52.0 % 39.4 41.0 40.1  Platelets 150 - 400 K/uL 100(L) 84(L) 85.0(L)    No results found for: VD25OH  Clinical ASCVD: No  The 10-year ASCVD risk score (Arnett DK, et al., 2019) is: 37.7%   Values used to calculate the score:     Age: 53 years     Sex:  Male     Is Non-Hispanic African American: No     Diabetic: Yes     Tobacco smoker: No     Systolic Blood Pressure: 081 mmHg     Is BP treated: Yes     HDL Cholesterol: 45.2 mg/dL     Total Cholesterol: 132 mg/dL    Depression screen [facility] 2/9 10/03/2020 02/24/2020 11/07/2019  Decreased Interest 0 0 0  Down, Depressed, Hopeless 0 0 0  PHQ - 2 Score 0 0 0  Altered sleeping - - 0  Tired, decreased energy - - 0  Change in appetite - - 0  Feeling bad or failure about yourself  - - 0  Trouble concentrating - - 0  Moving slowly or fidgety/restless - - 0  Suicidal thoughts - - 0  PHQ-9 Score - - 0  Difficult doing work/chores - - Not difficult at all  Some recent data might be hidden      Social History   Tobacco Use  Smoking Status Never  Smokeless Tobacco Never   BP Readings from Last 3 Encounters:  11/20/20 138/78  08/07/20 129/63  07/19/20 124/76   Pulse Readings from Last 3 Encounters:  11/20/20 83  08/07/20 89  07/19/20 88   Wt Readings from Last 3 Encounters:  11/20/20 (!) 304 lb (137.9 kg)  08/07/20 (!) 308 lb 1.9 oz (139.8 kg)  07/19/20 (!) 306 lb (138.8 kg)   BMI Readings from Last 3 Encounters:  11/20/20 45.55 kg/m  08/07/20 46.17 kg/m  07/19/20 45.85 kg/m    Assessment/Interventions: Review of patient past medical history, allergies, medications, health status, including review of consultants reports, laboratory and other test data, was performed as part of comprehensive evaluation and provision of chronic care management services.   SDOH:  (Social Determinants of Health) assessments and interventions performed: No  SDOH Screenings   Alcohol Screen: Not on file  Depression (PHQ2-9): Low Risk    PHQ-2 Score: 0  Financial Resource Strain: Low Risk    Difficulty of Paying Living Expenses: Not hard at all  Food Insecurity: No Food Insecurity   Worried About Charity fundraiser in the Last Year: Never true   Ran Out of Food in the Last Year: Never true   Housing: Low Risk    Last Housing Risk Score: 0  Physical Activity: Insufficiently Active   Days of Exercise per Week: 2 days   Minutes of Exercise per Session: 10 min  Social Connections: Moderately Integrated   Frequency of Communication with Friends and Family: Three times a week   Frequency of Social Gatherings with Friends and Family: Three times a week   Attends Religious Services: Never   Active Member of Clubs or Organizations: Yes  Attends Archivist Meetings: 1 to 4 times per year   Marital Status: Married  Stress: No Stress Concern Present   Feeling of Stress : Not at all  Tobacco Use: Low Risk    Smoking Tobacco Use: Never   Smokeless Tobacco Use: Never   Passive Exposure: Not on file  Transportation Needs: No Transportation Needs   Lack of Transportation (Medical): No   Lack of Transportation (Non-Medical): No    CCM Care Plan  No Known Allergies  Medications Reviewed Today     Reviewed by Philemon Kingdom, MD (Physician) on 11/20/20 at 1004  Med List Status: <None>   Medication Order Taking? Sig Documenting Provider Last Dose Status Informant  amLODipine (NORVASC) 10 MG tablet [id number]  TAKE ONE TABLET BY MOUTH EVERY MORNING Nafziger, Tommi Rumps, NP  Active   aspirin EC 81 MG tablet [id number] No Take 81 mg by mouth daily. [provider] Taking Active Spouse/Significant Other  BD INSULIN SYRINGE U/F 31G X 5/16" 1 ML MISC [id number] No USE THREE TIMES DAILY BEFORE MEALS Philemon Kingdom, MD Taking Active   Continuous Blood Gluc Sensor (FREESTYLE LIBRE 2 SENSOR) MISC [id number] No 1 each by Does not apply route every 14 (fourteen) days. Philemon Kingdom, MD Taking Active   fexofenadine [facility]) 180 MG tablet [id number] No Take 180 mg by mouth daily. [provider] Taking Active Self  glucose blood (ONETOUCH VERIO) test strip [id number] No USE TO check blood sugar THREE TIMES DAILY AS DIRECTED Philemon Kingdom, MD Taking Active   insulin  aspart (FIASP FLEXTOUCH) 100 UNIT/ML FlexTouch Pen [id number] No Inject 20-40 units 3x a day before meals, under skin Philemon Kingdom, MD Taking Active   insulin degludec (TRESIBA FLEXTOUCH) 200 UNIT/ML FlexTouch Pen [id number] No Use 100-140 units under skin daily Philemon Kingdom, MD Taking Active   metFORMIN (GLUCOPHAGE) 1000 MG tablet [id number] No Take 1 tablet (1,000 mg total) by mouth 2 (two) times daily with a meal. Philemon Kingdom, MD Taking Active   omeprazole (PRILOSEC) 20 MG capsule [id number] No TAKE ONE CAPSULE BY MOUTH EVERY MORNING Nafziger, Tommi Rumps, NP Taking Active   Semaglutide,0.25 or 0.5MG/DOS, (OZEMPIC, 0.25 OR 0.5 MG/DOSE,) 2 MG/1.5ML SOPN [id number] No Inject 0.5 mg into the skin once a week. Philemon Kingdom, MD Taking Active   sertraline (ZOLOFT) 100 MG tablet [id number]  TAKE ONE TABLET BY MOUTH EVERY MORNING Nafziger, Tommi Rumps, NP  Active   simvastatin (ZOCOR) 20 MG tablet [id number]  TAKE ONE TABLET BY MOUTH EVERY Francesco Runner, Tommi Rumps, NP  Active   valsartan-hydrochlorothiazide (DIOVAN-HCT) 160-25 MG tablet [id number]  TAKE ONE TABLET BY MOUTH EVERY MORNING Nafziger, Tommi Rumps, NP  Active             Patient Active Problem List   Diagnosis Date Noted   Elevated TSH 04/19/2018   Type 2 diabetes mellitus with hyperglycemia, with long-term current use of insulin ([place]) 07/12/2015   Chronic right shoulder pain 05/08/2014   OSA (obstructive sleep apnea) 05/13/2013   Preventative health care 05/13/2013   Fatty liver 09/09/2012   BPV (benign positional vertigo) 09/09/2012   Nonspecific elevation of levels of transaminase or lactic acid dehydrogenase (LDH) 09/09/2012   OBESITY 09/10/2007   TESTOSTERONE DEFICIENCY 12/25/2006   Hyperlipidemia 12/25/2006   Depression, recurrent ([place]) 12/25/2006   Essential hypertension 12/25/2006    Immunization History  Administered Date(s) Administered   Fluad Quad(high Dose 65+) 11/10/2018   Influenza,inj,Quad PF,6+ Mos 11/16/2012    PFIZER(Purple Top)SARS-COV-2 Vaccination 04/28/2019, 05/23/2019   Pneumococcal Polysaccharide-23  08/30/2008, 02/04/2019   Td 04/20/2004   Tdap 11/19/2014   Zoster, Live 07/10/2010   Patient and patient's wife still haven't purchased the bigger BP cuff to use to check BP at home. Patient decided to start using the [facility] sensors.  Conditions to be addressed/monitored:  Hypertension, Hyperlipidemia, Diabetes, GERD and Depression  Conditions addressed this visit: Diabetes, hypertension  Care Plan : CCM Pharmacy Care Plan  Updates made by Viona Gilmore, [place] since 12/26/2020 12:00 AM     Problem: Problem: Hypertension, Hyperlipidemia, Diabetes, GERD and Depression      Long-Range Goal: Patient-Specific Goal   Start Date: 06/27/2020  Expected End Date: 06/27/2021  Recent Progress: On track  Priority: High  Note:   Current Barriers:  Unable to independently afford treatment regimen Unable to independently monitor therapeutic efficacy Suboptimal therapeutic regimen for diabetes  Pharmacist Clinical Goal(s):  Patient will verbalize ability to afford treatment regimen achieve adherence to monitoring guidelines and medication adherence to achieve therapeutic efficacy through collaboration with PharmD and provider.   Interventions: 1:1 collaboration with Dorothyann Peng, NP regarding development and update of comprehensive plan of care as evidenced by provider attestation and co-signature Inter-disciplinary care team collaboration (see longitudinal plan of care) Comprehensive medication review performed; medication list updated in electronic medical record  Hypertension (BP goal <130/80) -Controlled -Current treatment: Amlodipine 20m, 1 tablet once daily  Valsartan-hydrochlorothiazide 160-239m 1 tablet once daily  -Medications previously tried: benazepril, losartan  -Current home readings: does not check at home; needs to get a new BP cuff -Current dietary habits: did not  discuss -Current exercise habits: did not discuss -Denies hypotensive/hypertensive symptoms -Educated on BP goals and benefits of medications for prevention of heart attack, stroke and kidney damage; Importance of home blood pressure monitoring; Proper BP monitoring technique; -Counseled to monitor BP at home weekly, document, and provide log at future appointments -Counseled on diet and exercise extensively Recommended to continue current medication  Hyperlipidemia: (LDL goal < 70) -Controlled -Current treatment: Simvastatin 2078m1 tablet once daily  -Medications previously tried: fenofibrate -Current dietary patterns: did not discuss -Current exercise habits: did not discuss -Educated on Cholesterol goals;  Benefits of statin for ASCVD risk reduction; Importance of limiting foods high in cholesterol; -Counseled on diet and exercise extensively Recommended to continue current medication  Diabetes (A1c goal <7%) -Controlled -Current medications: Ozempic 2 mg inject once weekly - ran out [place] 100-110 units daily - ran out [place] inject 30-40 units before meals -Medications previously tried: n/a  -Current home glucose readings fasting glucose: 182 average over the last 10 days; highest 301 and lowest 85 post prandial glucose: does not check often -Reports hypoglycemic/hyperglycemic symptoms -Current meal patterns:  breakfast: did not discuss  lunch: did not discuss   dinner: did not discuss  snacks: did not discuss  drinks: did not discuss  -Current exercise: did not discuss -Educated on A1c and blood sugar goals; Prevention and management of hypoglycemic episodes; Carbohydrate counting and/or plate method -Counseled to check feet daily and get yearly eye exams -Counseled on diet and exercise extensively Recommended to continue current medication  Depression (Goal: minimize symptoms) -Controlled -Current treatment: Sertraline 100m57m tablet once  daily -Medications previously tried/failed: none -PHQ9: 0 -Educated on Benefits of medication for symptom control -Recommended to continue current medication  GERD (Goal: minimize symptoms) -Controlled -Current treatment  Omeprazole 20mg50mcapsule once daily (patient reported not taking everyday) -Medications previously tried: none  -Recommended to continue current medication  Health  Maintenance -Vaccine gaps: shingrix, COVID booster (declines), Prevnar, influenza (declines) -Current therapy:  No medications -Educated on Cost vs benefit of each product must be carefully weighed by individual consumer -Patient is satisfied with current therapy and denies issues -Recommended to continue as is  Patient Goals/Self-Care Activities Patient will:  - take medications as prescribed check glucose multiple times a day, document, and provide at future appointments check blood pressure weekly, document, and provide at future appointments target a minimum of 150 minutes of moderate intensity exercise weekly  Follow Up Plan: Telephone follow up appointment with care management team member scheduled for: 6 months       Medication Assistance:  Ozempic obtained through [employer] medication assistance program.  Enrollment ends 01/19/21  Compliance/Adherence/Medication fill history: Care Gaps: Shingrix, colonoscopy, COVID booster, foot exam, eye exam, Prevnar Last PCP BP: 138/78 on 11/20/2020 Last A1C - 5.5 on 11/20/2020  Star-Rating Drugs: Metformin 1093m - last filled on 11/01/2020 90DS at [facility] Semaglutide - filled patient received through Patient assistance Simvastatin 244m- last filled on 11/01/2020 [facility]DS at [facility] Valsartan-hydrchlorothiazide 160-2549m last filled on 11/01/2020 90DS at [facility]   Patient's preferred pharmacy is:  [facility] [place]. [place]:  [phone]: [phone] [place] [place]: [phone]: [phone] pill box? No - adherence packaging Pt endorses 100% compliance  We discussed: Benefits of medication synchronization, packaging and delivery as well as enhanced pharmacist oversight with [facility]. Patient decided to: Utilize [facility] pharmacy for medication synchronization, packaging and delivery  Care Plan and Follow Up Patient Decision:  Patient agrees to Care Plan and Follow-up.  Plan: Telephone follow up appointment with care management team member scheduled for:  6 months  MadJeni SallesharmD, BCAMaxwellarmacist [place] [phone]

## 2020-12-26 NOTE — Patient Instructions (Addendum)
Hi Joe and [name],  It was great to catch up again! I set aside some Evaristo Bury so please pick it up at your earliest convenience.  Also, don't forget to get your shingles shot at the pharmacy and schedule your eye exam when you can!  Please reach out to me if you have any questions or need anything before our follow up!  Best, Maddie  [name], PharmD, [facility] Clinical Pharmacist [facility] [place] [phone]   Visit Information   Goals Addressed   None    Patient Care Plan: CCM Pharmacy Care Plan     Problem Identified: Problem: Hypertension, Hyperlipidemia, Diabetes, GERD and Depression      Long-Range Goal: Patient-Specific Goal   Start Date: 06/27/2020  Expected End Date: 06/27/2021  Recent Progress: On track  Priority: High  Note:   Current Barriers:  Unable to independently afford treatment regimen Unable to independently monitor therapeutic efficacy Suboptimal therapeutic regimen for diabetes  Pharmacist Clinical Goal(s):  Patient will verbalize ability to afford treatment regimen achieve adherence to monitoring guidelines and medication adherence to achieve therapeutic efficacy through collaboration with PharmD and provider.   Interventions: 1:1 collaboration with Shirline Frees, NP regarding development and update of comprehensive plan of care as evidenced by provider attestation and co-signature Inter-disciplinary care team collaboration (see longitudinal plan of care) Comprehensive medication review performed; medication list updated in electronic medical record  Hypertension (BP goal <130/80) -Controlled -Current treatment: Amlodipine 10mg , 1 tablet once daily  Valsartan-hydrochlorothiazide 160-25mg , 1 tablet once daily  -Medications previously tried: benazepril, losartan  -Current home readings: does not check at home; needs to get a new BP cuff -Current dietary habits: did not discuss -Current exercise habits: did not  discuss -Denies hypotensive/hypertensive symptoms -Educated on BP goals and benefits of medications for prevention of heart attack, stroke and kidney damage; Importance of home blood pressure monitoring; Proper BP monitoring technique; -Counseled to monitor BP at home weekly, document, and provide log at future appointments -Counseled on diet and exercise extensively Recommended to continue current medication  Hyperlipidemia: (LDL goal < 70) -Controlled -Current treatment: Simvastatin 20mg , 1 tablet once daily  -Medications previously tried: fenofibrate -Current dietary patterns: did not discuss -Current exercise habits: did not discuss -Educated on Cholesterol goals;  Benefits of statin for ASCVD risk reduction; Importance of limiting foods high in cholesterol; -Counseled on diet and exercise extensively Recommended to continue current medication  Diabetes (A1c goal <7%) -Controlled -Current medications: Ozempic 2 mg inject once weekly - ran out inject 100-110 units daily - ran out [place] inject inject 30-40 units before meals -Medications previously tried: n/a  -Current home glucose readings fasting glucose: 182 average over the last 10 days; highest 301 and lowest 85 post prandial glucose: does not check often -Reports hypoglycemic/hyperglycemic symptoms -Current meal patterns:  breakfast: did not discuss  lunch: did not discuss   dinner: did not discuss  snacks: did not discuss  drinks: did not discuss  -Current exercise: did not discuss -Educated on A1c and blood sugar goals; Prevention and management of hypoglycemic episodes; Carbohydrate counting and/or plate method -Counseled to check feet daily and get yearly eye exams -Counseled on diet and exercise extensively Recommended to continue current medication  Depression (Goal: minimize symptoms) -Controlled -Current treatment: Sertraline 100mg , 1 tablet once daily -Medications previously tried/failed:  none -PHQ9: 0 -Educated on Benefits of medication for symptom control -Recommended to continue current medication  GERD (Goal: minimize symptoms) -Controlled -Current treatment  Omeprazole 20mg , 1 capsule once  daily (patient reported not taking everyday) -Medications previously tried: none  -Recommended to continue current medication  Health Maintenance -Vaccine gaps: shingrix, COVID booster (declines), Prevnar, influenza (declines) -Current therapy:  No medications -Educated on Cost vs benefit of each product must be carefully weighed by individual consumer -Patient is satisfied with current therapy and denies issues -Recommended to continue as is  Patient Goals/Self-Care Activities Patient will:  - take medications as prescribed check glucose multiple times a day, document, and provide at future appointments check blood pressure weekly, document, and provide at future appointments target a minimum of 150 minutes of moderate intensity exercise weekly  Follow Up Plan: Telephone follow up appointment with care management team member scheduled for: 6 months       Patient verbalizes understanding of instructions provided today and agrees to view in MyChart.  Telephone follow up appointment with pharmacy team member scheduled for: 6 months  Verner Chol, [facility]

## 2020-12-28 ENCOUNTER — Other Ambulatory Visit: Payer: Self-pay

## 2020-12-28 MED ORDER — TRESIBA FLEXTOUCH 200 UNIT/ML ~~LOC~~ SOPN: PEN_INJECTOR | SUBCUTANEOUS | 0 refills | Status: DC

## 2021-01-03 ENCOUNTER — Telehealth: Payer: Self-pay | Admitting: Pharmacist

## 2021-01-03 NOTE — Chronic Care Management (AMB) (Signed)
° ° °  Chronic Care Management Pharmacy Assistant   Name: Billy Hale  MRN: [id number] DOB: [birth year]  Reason for Encounter: Follow up on patient assistance for Ozempic, Tresiba and [place].  These were not completed by [place] however, we are checking to be sure everything was submitted properly. Spoke with Chritaline at [employer] and she is not able to release any information about PAP done by another provider, I would need to have the Dr. Name, address, state license number and be an employee with that office.    Inetta Fermo [facility]  Clinical Pharmacist Assistant [phone]

## 2021-01-19 ENCOUNTER — Encounter: Payer: Self-pay | Admitting: Internal Medicine

## 2021-01-19 DIAGNOSIS — I10 Essential (primary) hypertension: Secondary | ICD-10-CM

## 2021-01-19 DIAGNOSIS — Z794 Long term (current) use of insulin: Secondary | ICD-10-CM | POA: Diagnosis not present

## 2021-01-19 DIAGNOSIS — E1165 Type 2 diabetes mellitus with hyperglycemia: Secondary | ICD-10-CM

## 2021-01-29 ENCOUNTER — Telehealth: Payer: Self-pay | Admitting: Pharmacist

## 2021-01-29 NOTE — Chronic Care Management (AMB) (Signed)
Chronic Care Management Pharmacy Assistant   Name: Billy Hale  MRN: [id number] DOB: 07/27/1950  Reason for Encounter: Medication Review / Medication Coordination Call   Conditions to be addressed/monitored: HTN   Recent office visits:  None  Recent consult visits:  None  Hospital visits:  None  Medications: Outpatient Encounter Medications as of 01/29/2021  Medication Sig   amLODipine (NORVASC) 10 MG tablet TAKE ONE TABLET BY MOUTH EVERY MORNING   aspirin EC 81 MG tablet Take 81 mg by mouth daily.   BD INSULIN SYRINGE U/F 31G X 5/16" 1 ML MISC USE THREE TIMES DAILY BEFORE MEALS   Continuous Blood Gluc Sensor (FREESTYLE LIBRE 2 SENSOR) MISC 1 each by Does not apply route every 14 (fourteen) days.   fexofenadine (ALLEGRA) 180 MG tablet Take 180 mg by mouth daily.   glucose blood (ONETOUCH VERIO) test strip USE TO check blood sugar THREE TIMES DAILY AS DIRECTED   insulin aspart (FIASP FLEXTOUCH) 100 UNIT/ML FlexTouch Pen Inject 20-40 units 3x a day before meals, under skin   insulin degludec (TRESIBA FLEXTOUCH) 200 UNIT/ML FlexTouch Pen Use 100-140 units under skin daily   insulin degludec (TRESIBA FLEXTOUCH) 200 UNIT/ML FlexTouch Pen Use as directed. Sample. Lot: [id number], Exp: 11/20/22   metFORMIN (GLUCOPHAGE) 1000 MG tablet Take 1 tablet (1,000 mg total) by mouth 2 (two) times daily with a meal.   omeprazole (PRILOSEC) 20 MG capsule TAKE ONE CAPSULE BY MOUTH EVERY MORNING   Semaglutide, 2 MG/DOSE, (OZEMPIC, 2 MG/DOSE,) 8 MG/3ML SOPN Inject 2 mg into the skin once a week.   sertraline (ZOLOFT) 100 MG tablet TAKE ONE TABLET BY MOUTH EVERY MORNING   simvastatin (ZOCOR) 20 MG tablet TAKE ONE TABLET BY MOUTH EVERY EVENING   valsartan-hydrochlorothiazide (DIOVAN-HCT) 160-25 MG tablet TAKE ONE TABLET BY MOUTH EVERY MORNING   No facility-administered encounter medications on file as of 01/29/2021.   Reviewed chart for medication changes ahead of medication coordination call.  No  OVs, Consults, or hospital visits since last care coordination call/Pharmacist visit. (If appropriate, list visit date, provider name)  No medication changes indicated OR if recent visit, treatment plan here.  BP Readings from Last 3 Encounters:  11/20/20 138/78  08/07/20 129/63  07/19/20 124/76    Lab Results  Component Value Date   HGBA1C 5.5 11/20/2020     Patient obtains medications through Adherence Packaging  90 Days   Last adherence delivery included: Amlodipine 10 mg; one tablet at breakfast Metformin 1000 mg; two tablets at breakfast Sertraline 100 mg; one tablet with breakfast Simvastatin 20 mg; one tablet with dinner Valsartan/ HCTZ 160/25 mg; one tablet at breakfast Aspirin 81 mg; one tablet at breakfast Omeprazole 20 mg; one capsule at breakfast Allegra 180 mg: one tablet at breakfast OneTouch Verio test strips; use to check blood sugar three times daily  Patient declined last month: BD Insulin Syringes 31G x 5/16 1 ML: Patient request BD brand (PAP) Freestyle Libre kit Sensor: Check blood sugar as directed (not using) Semaglutide (Ozempic) 1 mg: inject 1 mg into skin one a week (PAP) Fiasp flextouch 100unit/ml - Inject 20-40 units 3x a day before meals, under skin (PAP) Tyler Aas flextouch 200unit/ml - Use 100-140 units under skin daily (PAP)  Patient is due for next adherence delivery on: 02/07/2021.  Called patient and reviewed medications and coordinated delivery.  This delivery to include: Omeprazole 20 mg; one capsule at breakfast Valsartan/ HCTZ 160/25 mg; one tablet at breakfast Sertraline 100 mg; one tablet with  breakfast Simvastatin 20 mg; one tablet with dinner Metformin 1000 mg; two tablets at breakfast OneTouch Verio test strips; check blood as directed Amlodipine 10 mg; one tablet at breakfast Allegra 180 mg: one tablet at breakfast Aspirin 81 mg; one tablet at breakfast Freestyle Libre kit Sensor: Check blood sugar as directed  Patient will  need a short fill: No short fill needed  Coordinated acute fill: BD insulin needles needed, message sent (Teams) to Jeni Salles for her or Dorothyann Peng to send in a script to [facility].  Patient is currently out of the needles.   Patient declined the following medications:  Freestyle Libre kit Sensor: Check blood sugar as directed (not using) Semaglutide (Ozempic) 1 mg: inject 1 mg into skin one a week (PAP) Fiasp flextouch 100unit/ml - Inject 20-40 units 3x a day before meals, under skin (PAP) Tyler Aas flextouch 200unit/ml - Use 100-140 units under skin daily (PAP) Confirmed delivery date of 02/07/2021, advised patient that pharmacy will contact them the morning of delivery.   Care Gaps: AWV - completed on 10/03/20 Zoster vaccines - never done Covid-19 vaccine - overdue Colonoscopy - overdue  Foot exam - overdue Ophthalmology exam - overdue  Last BP: 138/78 on 11/20/2020 Last A1C - 5.5 on 11/20/2020  Star Rating Drugs: Metformin 108m - last filled on 11/01/2020 [facility] at [facility]- filled patient received through Patient assistance Simvastatin 217m- last filled on 11/01/2020 [facility] at [facility] Valsartan-hydrchlorothiazide 160-2530m last filled on 11/01/2020 [phone]

## 2021-01-31 ENCOUNTER — Telehealth: Payer: Self-pay

## 2021-01-31 ENCOUNTER — Other Ambulatory Visit: Payer: Self-pay | Admitting: Internal Medicine

## 2021-01-31 NOTE — Telephone Encounter (Signed)
-----   Message from Sherrin Daisy, [place] sent at 01/30/2021  8:00 AM EST ----- Regarding: RE: Syringes refill Pt needs an appt. Has not been seen in a while for DM.  ----- Message ----- From: Verner Chol, [facility] Sent: 01/29/2021   2:00 PM EST To: [facility] Subject: Syringes refill                                Hi,  Can you please send a refill of BD Insulin Syringes 31G x 5/16" 1 ML: (please make sure it's BD brand per patient request) to [facility]? He is out of these.  Thanks! Maddie

## 2021-01-31 NOTE — Telephone Encounter (Signed)
Called pt to schedule a CPE 2x but no answer.

## 2021-02-07 ENCOUNTER — Telehealth: Payer: Self-pay

## 2021-02-07 NOTE — Telephone Encounter (Signed)
Called pt multiple times to schedule a visit. Pt is due for CPE soon. Pt needs to call and schedule appt. for refills. Also, looks like pt sees endo for Dm. Pt will need to reach out to Dr.Gherge for dm refills.

## 2021-02-07 NOTE — Telephone Encounter (Signed)
-----   Message from Tandrea Kommer R Manley Fason, CMA sent at 01/30/2021  8:00 AM EST ----- °Regarding: RE: Syringes refill °Pt needs an appt. Has not been seen in a while for DM.  °----- Message ----- °From: Pryor, Madeline G, RPH °Sent: 01/29/2021   2:00 PM EST °To: [facility] °Subject: Syringes refill                               ° °Hi, ° °Can you please send a refill of BD Insulin Syringes 31G x 5/16" 1 ML: (please make sure it's BD brand per patient request) to [facility] [address]? He is out of these. ° °Thanks! °Maddie ° ° °

## 2021-02-19 ENCOUNTER — Telehealth: Payer: Self-pay | Admitting: Internal Medicine

## 2021-02-19 NOTE — Telephone Encounter (Signed)
Called and spoke with pt's wife, application was actually already approved they were calling regarding medication delivery. Advised to [employer] for a status on their delivery or a tracking number to get an ETA. Verbalized understanding.

## 2021-02-19 NOTE — Telephone Encounter (Signed)
Please provide confirmation that Patient Assistance paperwork has been completed and sent. [phone]

## 2021-03-14 ENCOUNTER — Telehealth: Payer: Self-pay

## 2021-03-14 NOTE — Telephone Encounter (Signed)
Called and left a message advising patient assistance medication has been delivered and is ready for pick up. Fiasp 10 boxes and [place] 8 boxes.

## 2021-03-21 ENCOUNTER — Encounter: Payer: Self-pay | Admitting: Internal Medicine

## 2021-03-22 ENCOUNTER — Encounter: Payer: Self-pay | Admitting: Internal Medicine

## 2021-03-22 ENCOUNTER — Other Ambulatory Visit: Payer: Self-pay

## 2021-03-22 ENCOUNTER — Ambulatory Visit (INDEPENDENT_AMBULATORY_CARE_PROVIDER_SITE_OTHER): Payer: PPO | Admitting: Internal Medicine

## 2021-03-22 VITALS — BP 130/62 | HR 68 | Ht 68.5 in | Wt 311.2 lb

## 2021-03-22 DIAGNOSIS — Z794 Long term (current) use of insulin: Secondary | ICD-10-CM

## 2021-03-22 DIAGNOSIS — E669 Obesity, unspecified: Secondary | ICD-10-CM | POA: Diagnosis not present

## 2021-03-22 DIAGNOSIS — E782 Mixed hyperlipidemia: Secondary | ICD-10-CM

## 2021-03-22 DIAGNOSIS — E1165 Type 2 diabetes mellitus with hyperglycemia: Secondary | ICD-10-CM | POA: Diagnosis not present

## 2021-03-22 DIAGNOSIS — E038 Other specified hypothyroidism: Secondary | ICD-10-CM | POA: Diagnosis not present

## 2021-03-22 LAB — POCT GLYCOSYLATED HEMOGLOBIN (HGB A1C): Hemoglobin A1C: 6.3 % — AB (ref 4.0–5.6)

## 2021-03-22 NOTE — Patient Instructions (Addendum)
Please continue: ?- Metformin 1000 mg 2x a day ?- Ozempic 2 mg weekly ? ?Try to change back: ?- Tresiba 106 mg daily ?- FiAsp 25-40 units before meals ? ?Please return in 4 months with your sugar log. ?
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
## 2021-03-22 NOTE — Telephone Encounter (Signed)
Pt picked up his pt assistance. ?

## 2021-04-10 ENCOUNTER — Ambulatory Visit (INDEPENDENT_AMBULATORY_CARE_PROVIDER_SITE_OTHER): Payer: PPO | Admitting: Adult Health

## 2021-04-10 ENCOUNTER — Encounter: Payer: Self-pay | Admitting: Adult Health

## 2021-04-10 ENCOUNTER — Encounter: Payer: Self-pay | Admitting: Family

## 2021-04-10 VITALS — BP 128/60 | HR 83 | Temp 98.6°F | Ht 68.5 in | Wt 310.0 lb

## 2021-04-10 DIAGNOSIS — I10 Essential (primary) hypertension: Secondary | ICD-10-CM

## 2021-04-10 DIAGNOSIS — E782 Mixed hyperlipidemia: Secondary | ICD-10-CM

## 2021-04-10 DIAGNOSIS — Z125 Encounter for screening for malignant neoplasm of prostate: Secondary | ICD-10-CM

## 2021-04-10 DIAGNOSIS — G4733 Obstructive sleep apnea (adult) (pediatric): Secondary | ICD-10-CM | POA: Diagnosis not present

## 2021-04-10 DIAGNOSIS — E1165 Type 2 diabetes mellitus with hyperglycemia: Secondary | ICD-10-CM | POA: Diagnosis not present

## 2021-04-10 DIAGNOSIS — Z0000 Encounter for general adult medical examination without abnormal findings: Secondary | ICD-10-CM

## 2021-04-10 DIAGNOSIS — F339 Major depressive disorder, recurrent, unspecified: Secondary | ICD-10-CM

## 2021-04-10 DIAGNOSIS — E669 Obesity, unspecified: Secondary | ICD-10-CM

## 2021-04-10 DIAGNOSIS — Z794 Long term (current) use of insulin: Secondary | ICD-10-CM

## 2021-04-10 LAB — CBC WITH DIFFERENTIAL/PLATELET
Basophils Absolute: 0 10*3/uL (ref 0.0–0.1)
Basophils Relative: 0.4 % (ref 0.0–3.0)
Eosinophils Absolute: 0.2 10*3/uL (ref 0.0–0.7)
Eosinophils Relative: 3.7 % (ref 0.0–5.0)
HCT: 41.2 % (ref 39.0–52.0)
Hemoglobin: 14.2 g/dL (ref 13.0–17.0)
Lymphocytes Relative: 23 % (ref 12.0–46.0)
Lymphs Abs: 1 10*3/uL (ref 0.7–4.0)
MCHC: 34.5 g/dL (ref 30.0–36.0)
MCV: 98.9 fl (ref 78.0–100.0)
Monocytes Absolute: 0.4 10*3/uL (ref 0.1–1.0)
Monocytes Relative: 9.4 % (ref 3.0–12.0)
Neutro Abs: 2.7 10*3/uL (ref 1.4–7.7)
Neutrophils Relative %: 63.5 % (ref 43.0–77.0)
Platelets: 73 10*3/uL — ABNORMAL LOW (ref 150.0–400.0)
RBC: 4.17 Mil/uL — ABNORMAL LOW (ref 4.22–5.81)
RDW: 14.1 % (ref 11.5–15.5)
WBC: 4.3 10*3/uL (ref 4.0–10.5)

## 2021-04-10 LAB — LIPID PANEL
Cholesterol: 126 mg/dL (ref 0–200)
HDL: 44.5 mg/dL (ref >39.00)
LDL Cholesterol: 66 mg/dL (ref 0–99)
NonHDL: 81.83
Total CHOL/HDL Ratio: 3
Triglycerides: 80 mg/dL (ref 0.0–149.0)
VLDL: 16 mg/dL (ref 0.0–40.0)

## 2021-04-10 LAB — COMPREHENSIVE METABOLIC PANEL
ALT: 31 U/L (ref 0–53)
AST: 28 U/L (ref 0–37)
Albumin: 4.1 g/dL (ref 3.5–5.2)
Alkaline Phosphatase: 126 U/L — ABNORMAL HIGH (ref 39–117)
BUN: 18 mg/dL (ref 6–23)
CO2: 30 mEq/L (ref 19–32)
Calcium: 9.4 mg/dL (ref 8.4–10.5)
Chloride: 105 mEq/L (ref 96–112)
Creatinine, Ser: 0.84 mg/dL (ref 0.40–1.50)
GFR: 87.77 mL/min (ref >60.00)
Glucose, Bld: 111 mg/dL — ABNORMAL HIGH (ref 70–99)
Potassium: 3.7 mEq/L (ref 3.5–5.1)
Sodium: 141 mEq/L (ref 135–145)
Total Bilirubin: 0.6 mg/dL (ref 0.2–1.2)
Total Protein: 7.3 g/dL (ref 6.0–8.3)

## 2021-04-10 LAB — PSA: PSA: 0.97 ng/mL (ref 0.10–4.00)

## 2021-04-10 LAB — TSH: TSH: 5.4 u[IU]/mL (ref 0.35–5.50)
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
## 2021-04-10 NOTE — Patient Instructions (Signed)
It was great seeing you today   We will follow up with you regarding your lab work   Please let me know if you need anything   

## 2021-04-25 ENCOUNTER — Other Ambulatory Visit: Payer: Self-pay | Admitting: Adult Health

## 2021-04-25 DIAGNOSIS — K2100 Gastro-esophageal reflux disease with esophagitis, without bleeding: Secondary | ICD-10-CM

## 2021-04-26 ENCOUNTER — Telehealth: Payer: Self-pay | Admitting: Pharmacist

## 2021-04-26 NOTE — Chronic Care Management (AMB) (Signed)
? ? ?Chronic Care Management ?Pharmacy Assistant  ? ?Name: Billy Hale  MRN: [id number] DOB: 1950-08-02 ? ?Reason for Encounter: Disease State and Medication Review / Medication Coordination and Diabetes Assessment Call ?  ?Conditions to be addressed/monitored: ?DMII ? ? ?Recent office visits:  ?04/10/2021 Dorothyann Peng NP - Patient was seen for routine general medical exam and additional issues. Discontinued Omeprazole. Follow up in 1 year.  ? ?Recent consult visits:  ?03/22/2021 Philemon Kingdom MD (endocrinology) - Patient was seen for Type 2 diabetes mellitus with hyperglycemia, with long-term current use of insulin and additional issues. Try to change back:- Tresiba 106 mg daily- FiAsp 25-40 units before meals. Follow up in 4 months.  ? ?Hospital visits:  ?None ? ?Medications: ?Outpatient Encounter Medications as of 04/26/2021  ?Medication Sig  ? amLODipine (NORVASC) 10 MG tablet TAKE ONE TABLET BY MOUTH EVERY MORNING  ? aspirin EC 81 MG tablet Take 81 mg by mouth daily.  ? BD INSULIN SYRINGE U/F 31G X 5/16" 1 ML MISC USE THREE TIMES DAILY BEFORE MEALS  ? Continuous Blood Gluc Sensor (FREESTYLE LIBRE 2 SENSOR) MISC USE TO check blood sugar DAILY AS DIRECTED. CHANGE every 14 DAYS  ? fexofenadine (ALLEGRA) 180 MG tablet Take 180 mg by mouth daily.  ? glucose blood (ONETOUCH VERIO) test strip USE TO check blood sugar THREE TIMES DAILY AS DIRECTED  ? insulin aspart (FIASP FLEXTOUCH) 100 UNIT/ML FlexTouch Pen Inject 20-40 units 3x a day before meals, under skin  ? insulin degludec (TRESIBA FLEXTOUCH) 200 UNIT/ML FlexTouch Pen Use 100-140 units under skin daily  ? insulin degludec (TRESIBA FLEXTOUCH) 200 UNIT/ML FlexTouch Pen Use as directed. Sample. Lot: [id number], Exp: 11/20/22  ? metFORMIN (GLUCOPHAGE) 1000 MG tablet Take 1 tablet (1,000 mg total) by mouth 2 (two) times daily with a meal.  ? omeprazole (PRILOSEC) 20 MG capsule TAKE ONE TABLET BY MOUTH EVERY MORNING  ? Semaglutide, 2 MG/DOSE, (OZEMPIC, 2 MG/DOSE,) 8  MG/3ML SOPN Inject 2 mg into the skin once a week.  ? sertraline (ZOLOFT) 100 MG tablet TAKE ONE TABLET BY MOUTH EVERY MORNING  ? simvastatin (ZOCOR) 20 MG tablet TAKE ONE TABLET BY MOUTH EVERY EVENING  ? valsartan-hydrochlorothiazide (DIOVAN-HCT) 160-25 MG tablet TAKE ONE TABLET BY MOUTH EVERY MORNING  ? ?No facility-administered encounter medications on file as of 04/26/2021.  ? ?Reviewed chart for medication changes ahead of medication coordination call. ? ?No OVs, Consults, or hospital visits since last care coordination call/Pharmacist visit. (If appropriate, list visit date, provider name) ? ?No medication changes indicated OR if recent visit, treatment plan here. ? ?BP Readings from Last 3 Encounters:  ?04/10/21 128/60  ?03/22/21 130/62  ?11/20/20 138/78  ?  ?Lab Results  ?Component Value Date  ? HGBA1C 6.3 (A) 03/22/2021  ?  ? ?Patient obtains medications through Adherence Packaging  90 Days  ?  ?Last adherence delivery included: ?Omeprazole 20 mg; one capsule at breakfast ?Valsartan/ HCTZ 160/25 mg; one tablet at breakfast ?Sertraline 100 mg; one tablet with breakfast ?Simvastatin 20 mg; one tablet with dinner ?Metformin 1000 mg; two tablets at breakfast ?OneTouch Verio test strips; check blood as directed ?Amlodipine 10 mg; one tablet at breakfast ?Allegra 180 mg: one tablet at breakfast ?Aspirin 81 mg; one tablet at breakfast ?Freestyle Libre kit Sensor: Check blood sugar as directed ?  ?Patient declined last month: ?Freestyle Libre kit Sensor: Check blood sugar as directed (not using) ?Semaglutide (Ozempic) 1 mg: inject 1 mg into skin one a week (PAP) ?Fiasp flextouch 100unit/ml -  Inject 20-40 units 3x a day before meals, under skin (PAP) ?Tyler Aas flextouch 200unit/ml - Use 100-140 units under skin daily (PAP) ?  ?Patient is due for next adherence delivery on: 05/08/2021. ?  ?Called patient and reviewed medications and coordinated delivery. ?  ?This delivery to include: ?Valsartan/ HCTZ 160/25 mg; one  tablet at breakfast ?Sertraline 100 mg; one tablet with breakfast ?Simvastatin 20 mg; one tablet with dinner ?Metformin 1000 mg; two tablets at breakfast ?OneTouch Verio test strips; check blood as directed ?Amlodipine 10 mg; one tablet at breakfast ?Allegra 180 mg: one tablet at breakfast ?Aspirin 81 mg; one tablet at breakfast ?Freestyle Libre kit Sensor: Check blood sugar as directed ?  ?Patient will need a short fill: No short fill needed ?   ?Patient declined the following medications: ?Omeprazole - medication has been discontinued ? ?Medications filled by PAP: ? Semaglutide (Ozempic) 1 mg ?Fiasp flextouch 100unit/ml ?Tyler Aas flextouch 200unit/ml ? ?Confirmed delivery date of 05/08/2021 with patient's wife, advised patients wife that pharmacy will contact them the morning of delivery. ? ?Recent Relevant Labs: ?Lab Results  ?Component Value Date/Time  ? HGBA1C 6.3 (A) 03/22/2021 10:50 AM  ? HGBA1C 5.5 11/20/2020 10:15 AM  ? HGBA1C 8.6 (H) 01/23/2017 09:46 AM  ? HGBA1C 8.1 (H) 04/20/2015 09:02 AM  ? MICROALBUR 61.9 (H) 01/06/2018 09:21 AM  ? MICROALBUR 43.0 (H) 04/20/2015 09:02 AM  ?  ?Kidney Function ?Lab Results  ?Component Value Date/Time  ? CREATININE 0.84 04/10/2021 08:57 AM  ? CREATININE 0.79 08/07/2020 01:50 PM  ? CREATININE 0.83 04/30/2020 09:16 AM  ? CREATININE 0.84 01/06/2018 09:21 AM  ? CREATININE 0.79 06/25/2012 05:03 PM  ? GFR 87.77 04/10/2021 08:57 AM  ? GFRNONAA >60 08/07/2020 01:50 PM  ? GFRNONAA 90 01/06/2018 09:21 AM  ? GFRAA 104 01/06/2018 09:21 AM  ? ? ?Current antihyperglycemic regimen:  ?Fiasp Flextouch 100 un/ml - Inject 25-40 units  before meals ?Tyler Aas Flextouch 200 un/ml - Use 106 units under skin daily ?Metformin 1000 mg twice daily ?Ozempic 54m - Inject 2 mg into the skin once a week (out of Ozempic due to shortage) ? ?What recent interventions/DTPs have been made to improve glycemic control: Recent dose changes to FRomania  ? ?Have there been any recent hospitalizations or  ED visits since last visit with CPP? No recent hospital visits.  ? ?Patient reports hypoglycemic symptoms, including  anxious and hot ? ?Patient reports hyperglycemic symptoms, including headache ? ?How often are you checking your blood sugar? Several times daily ? ?What are your blood sugars ranging?  ?Patients wife states the range of his blood sugars for the past two weeks was between 50 and 218.  ? ?During the week, how often does your blood glucose drop below 70? Patients wife states his blood sugars drop below 70 fairly often. ? ?Are you checking your feet daily/regularly? Yes ? ?Adherence Review: ?Is the patient currently on a STATIN medication? Yes ?Is the patient currently on ACE/ARB medication? Yes ?Does the patient have >5 day gap between last estimated fill dates? No ? ?Care Gaps: ?AWV - message sent to RRamond Craver?Last BP - 128/60 on 04/10/2021 ?Last A1C - 6.3 on 03/22/2021 ?Eye exam - overdue ?  ?Star Rating Drugs: ?Metformin 10048m- last filled on 02/04/2021 90DS at [facility] ?Semaglutide - filled patient received through Patient assistance ?Simvastatin 2068m last filled on 02/04/2021 90DS at [facility] ?Valsartan-hydrchlorothiazide 160-74m54mlast filled on 02/04/2021 90DS at [facility] ? ?JaniGennie Alma  ?Clinical  Pharmacist Assistant ?[phone] ? ?

## 2021-05-30 ENCOUNTER — Telehealth: Payer: Self-pay | Admitting: Pharmacist

## 2021-05-30 DIAGNOSIS — E1165 Type 2 diabetes mellitus with hyperglycemia: Secondary | ICD-10-CM

## 2021-05-30 DIAGNOSIS — Z0000 Encounter for general adult medical examination without abnormal findings: Secondary | ICD-10-CM

## 2021-05-30 NOTE — Telephone Encounter (Signed)
Patient's wife called as he is having trouble keeping the freestyle libre sensors to stay on his arm. Recommended liquid bandaid or stickers to go around the device. Discussed the possible advantages of switching to freestyle libre 3 to see if this helps with adhesion with smaller sized sensor. Will route to endocrinologist to see if she would be ok with the switch to the 3.  ?

## 2021-05-31 MED ORDER — FREESTYLE LIBRE 3 SENSOR MISC: 3 refills | Status: DC

## 2021-05-31 NOTE — Telephone Encounter (Signed)
Rx sent to preferred pharmacy. Pt's wife notified. ?

## 2021-06-25 ENCOUNTER — Telehealth: Payer: Self-pay | Admitting: Pharmacist

## 2021-06-25 NOTE — Chronic Care Management (AMB) (Signed)
    Chronic Care Management Pharmacy Assistant   Name: Billy Hale  MRN: [id number] DOB: [birth year]  06/26/2021 APPOINTMENT REMINDER  Called [name], No answer, left message of appointment on 06/26/2021 at 1:30 via telephone visit with Jeni Salles, Pharm D. Notified to have all medications, supplements, blood pressure and/or blood sugar logs available during appointment and to return call if need to reschedule.  Care Gaps: AWV - previous message sent to [name] Last BP - 128/60 on 04/10/2021 Last A1C - 6.3 on 03/22/2021 Eye exam - overdue Shingrix - postponed Pneumovax - postponed  Star Rating Drug: Semaglutide - patient assistance Metformin 1000 mg - last filled 05/01/2021 90 DS at [facility]  Simvastatin 20 mg - last filled 05/01/2021 90 DS at [facility]  Valsartan HCTZ - 160/25 mg - last filled 05/01/2021 90 DS at [facility]  Any gaps in medications fill history? No  [name] [facility]  Catering manager [phone]

## 2021-06-26 ENCOUNTER — Ambulatory Visit (INDEPENDENT_AMBULATORY_CARE_PROVIDER_SITE_OTHER): Payer: PPO | Admitting: Pharmacist

## 2021-06-26 DIAGNOSIS — I10 Essential (primary) hypertension: Secondary | ICD-10-CM

## 2021-06-26 DIAGNOSIS — E1165 Type 2 diabetes mellitus with hyperglycemia: Secondary | ICD-10-CM

## 2021-06-26 NOTE — Patient Instructions (Addendum)
Hi Billy Hale and Billy Hale,  It was great to catch up again! I am glad that you are liking the [place] 3 sensors so much better!  Don't forget to get a blood pressure cuff for Billy Hale.  Please reach out to me if you have any questions or need anything before our follow up!  Best, Maddie  [name], PharmD, [facility] Clinical Pharmacist [facility] [phone]   Visit Information   Goals Addressed   None    Patient Care Plan: CCM Pharmacy Care Plan     Problem Identified: Problem: Hypertension, Hyperlipidemia, Diabetes, GERD and Depression      Long-Range Goal: Patient-Specific Goal   Start Date: 06/27/2020  Expected End Date: 06/27/2021  Recent Progress: On track  Priority: High  Note:   Current Barriers:  Unable to independently afford treatment regimen Unable to independently monitor therapeutic efficacy  Pharmacist Clinical Goal(s):  Patient will verbalize ability to afford treatment regimen achieve adherence to monitoring guidelines and medication adherence to achieve therapeutic efficacy through collaboration with PharmD and provider.   Interventions: 1:1 collaboration with Shirline Frees, NP regarding development and update of comprehensive plan of care as evidenced by provider attestation and co-signature Inter-disciplinary care team collaboration (see longitudinal plan of care) Comprehensive medication review performed; medication list updated in electronic medical record  Hypertension (BP goal <130/80) -Controlled -Current treatment: Amlodipine 10mg , 1 tablet once daily - Appropriate, Effective, Safe, Accessible Valsartan-hydrochlorothiazide 160-25mg , 1 tablet once daily - Appropriate, Effective, Safe, Accessible  -Medications previously tried: benazepril, losartan  -Current home readings: does not check at home; needs to get a new BP cuff -Current dietary habits: did not discuss -Current exercise habits: did not discuss -Denies  hypotensive/hypertensive symptoms -Educated on BP goals and benefits of medications for prevention of heart attack, stroke and kidney damage; Importance of home blood pressure monitoring; Proper BP monitoring technique; -Counseled to monitor BP at home weekly, document, and provide log at future appointments -Counseled on diet and exercise extensively Recommended to continue current medication  Hyperlipidemia: (LDL goal < 70) -Controlled -Current treatment: Simvastatin 20mg , 1 tablet once daily - Appropriate, Effective, Safe, Accessible  -Medications previously tried: fenofibrate -Current dietary patterns: did not discuss -Current exercise habits: did not discuss -Educated on Cholesterol goals;  Benefits of statin for ASCVD risk reduction; Importance of limiting foods high in cholesterol; -Counseled on diet and exercise extensively Recommended to continue current medication  Diabetes (A1c goal <7%) -Controlled -Current medications: Ozempic 2 mg inject once weekly - Appropriate, Effective, Safe, Accessible Tresiba inject 106 units daily - Appropriate, Effective, Query Safe, Accessible Fiasp inject inject 25-40 units before meals - Appropriate, Effective, Query Safe, Accessible -Medications previously tried: n/a  -Current home glucose readings fasting glucose: 144 average over the last 14 days post prandial glucose: refer to CGM data -Reports hypoglycemic/hyperglycemic symptoms -Current meal patterns:  breakfast: did not discuss  lunch: did not discuss   dinner: did not discuss  snacks: did not discuss  drinks: did not discuss  -Current exercise: did not discuss -Educated on A1c and blood sugar goals; Prevention and management of hypoglycemic episodes; Carbohydrate counting and/or plate method -Counseled to check feet daily and get yearly eye exams -Counseled on diet and exercise extensively Recommended to continue current medication  Depression (Goal: minimize  symptoms) -Controlled -Current treatment: Sertraline 100mg , 1 tablet once daily - Appropriate, Effective, Safe, Accessible -Medications previously tried/failed: none -PHQ9: 0 -Educated on Benefits of medication for symptom control -Recommended to continue current  medication  GERD (Goal: minimize symptoms) -Controlled -Current treatment  Omeprazole 20mg , 1 capsule once daily (patient reported not taking everyday - Appropriate, Effective, Safe, Accessible -Medications previously tried: none  -Recommended to continue current medication  Health Maintenance -Vaccine gaps: shingrix, COVID booster (declines), Prevnar, influenza (declines) -Current therapy:  No medications -Educated on Cost vs benefit of each product must be carefully weighed by individual consumer -Patient is satisfied with current therapy and denies issues -Recommended to continue as is  Patient Goals/Self-Care Activities Patient will:  - take medications as prescribed check glucose multiple times a day, document, and provide at future appointments check blood pressure weekly, document, and provide at future appointments target a minimum of 150 minutes of moderate intensity exercise weekly  Follow Up Plan: Telephone follow up appointment with care management team member scheduled for: 6 months       Patient verbalizes understanding of instructions and care plan provided today and agrees to view in MyChart. Active MyChart status and patient understanding of how to access instructions and care plan via MyChart confirmed with patient.    Telephone follow up appointment with pharmacy team member scheduled for: 6 months  , [facility]

## 2021-06-26 NOTE — Progress Notes (Signed)
Chronic Care Management Pharmacy Note  06/26/2021 Name:  Billy Hale MRN:  [id number] DOB:  03/15/1950  Summary: A1c at goal of < 7% but BGs have been fluctuating some Pt is loving the Freestyle libre 3 sensors  Recommendations/Changes made from today's visit: -Recommended purchasing a BP cuff to monitor at home -Consider decreasing dose of Tresiba due to overbasalization (max should be 70 units based on weight)  Plan: Follow up BP assessment in 3 months Follow up in 6 months   Subjective: Billy Hale is an 71 y.o. year old male who is a primary patient of Dorothyann Peng, NP.  The CCM team was consulted for assistance with disease management and care coordination needs.    Engaged with patient by telephone for follow up visit in response to provider referral for pharmacy case management and/or care coordination services.   Consent to Services:  The patient was given information about Chronic Care Management services, agreed to services, and gave verbal consent prior to initiation of services.  Please see initial visit note for detailed documentation.   Patient Care Team: Dorothyann Peng, NP as PCP - General (Family Medicine) Viona Gilmore, [facility] as Pharmacist (Pharmacist)  Recent office visits: 04/10/2021 Dorothyann Peng NP - Patient was seen for routine general medical exam and additional issues. Discontinued Omeprazole. Follow up in 1 year.   Recent consult visits: 03/22/21 Philemon Kingdom MD (endocrinology) - Patient was seen for Type 2 diabetes mellitus with hyperglycemia, with long-term current use of insulin and additional issues. Try to change back:- Tresiba 106 mg daily- FiAsp 25-40 units before meals. Follow up in 4 months.   08/07/20 Laverna Peace NP (Oncology) - seen for thrombocytopenia. No medication changes. Follow up in 4 months.   Hospital visits: None in previous 6 months   Objective:  Lab Results  Component Value Date   CREATININE 0.84 04/10/2021    BUN 18 04/10/2021   GFR 87.77 04/10/2021   GFRNONAA >60 08/07/2020   GFRAA 104 01/06/2018   NA 141 04/10/2021   K 3.7 04/10/2021   CALCIUM 9.4 04/10/2021   CO2 30 04/10/2021   GLUCOSE 111 (H) 04/10/2021    Lab Results  Component Value Date/Time   HGBA1C 6.3 (A) 03/22/2021 10:50 AM   HGBA1C 5.5 11/20/2020 10:15 AM   HGBA1C 8.6 (H) 01/23/2017 09:46 AM   HGBA1C 8.1 (H) 04/20/2015 09:02 AM   GFR 87.77 04/10/2021 08:57 AM   GFR 91.91 02/24/2020 10:57 AM   MICROALBUR 61.9 (H) 01/06/2018 09:21 AM   MICROALBUR 43.0 (H) 04/20/2015 09:02 AM    Last diabetic Eye exam:  Lab Results  Component Value Date/Time   HMDIABEYEEXA No Retinopathy 06/10/2017 12:00 AM    Last diabetic Foot exam:  Lab Results  Component Value Date/Time   HMDIABFOOTEX done 11/08/2010 12:00 AM     Lab Results  Component Value Date   CHOL 126 04/10/2021   HDL 44.50 04/10/2021   LDLCALC 66 04/10/2021   LDLDIRECT 72.9 11/01/2010   TRIG 80.0 04/10/2021   CHOLHDL 3 04/10/2021       Latest Ref Rng & Units 04/10/2021    8:57 AM 08/07/2020    1:50 PM 04/30/2020    9:16 AM  Hepatic Function  Total Protein 6.0 - 8.3 g/dL 7.3   7.4   7.3    Albumin 3.5 - 5.2 g/dL 4.1   4.0   4.2    AST 0 - 37 U/L 28   24   26  ALT 0 - 53 U/L 31   31   28     Alk Phosphatase 39 - 117 U/L 126   108   99    Total Bilirubin 0.2 - 1.2 mg/dL 0.6   0.5   0.4      Lab Results  Component Value Date/Time   TSH 5.40 04/10/2021 08:57 AM   TSH 3.64 02/24/2020 10:57 AM   FREET4 0.64 02/04/2019 09:43 AM   FREET4 0.56 (L) 01/06/2018 09:21 AM       Latest Ref Rng & Units 04/10/2021    8:57 AM 08/07/2020    1:50 PM 04/30/2020    9:16 AM  CBC  WBC 4.0 - 10.5 K/uL 4.3   6.7   5.7    Hemoglobin 13.0 - 17.0 g/dL 14.2   13.9   14.4    Hematocrit 39.0 - 52.0 % 41.2   39.4   41.0    Platelets 150.0 - 400.0 K/uL 73.0   100   84      No results found for: VD25OH  Clinical ASCVD: No  The ASCVD Risk score (Arnett DK, et al., 2019) failed to  calculate for the following reasons:   The valid total cholesterol range is 130 to 320 mg/dL       04/10/2021    8:21 AM 10/03/2020   11:11 AM 02/24/2020   10:18 AM  Depression screen PHQ 2/9  Decreased Interest 0 0 0  Down, Depressed, Hopeless 0 0 0  PHQ - 2 Score 0 0 0  Altered sleeping 1    Tired, decreased energy 1    Change in appetite 0    Feeling bad or failure about yourself  0    Trouble concentrating 0    Moving slowly or fidgety/restless 0    Suicidal thoughts 0    PHQ-9 Score 2    Difficult doing work/chores Not difficult at all        Social History   Tobacco Use  Smoking Status Never  Smokeless Tobacco Never   BP Readings from Last 3 Encounters:  04/10/21 128/60  03/22/21 130/62  11/20/20 138/78   Pulse Readings from Last 3 Encounters:  04/10/21 83  03/22/21 68  11/20/20 83   Wt Readings from Last 3 Encounters:  04/10/21 (!) 310 lb (140.6 kg)  03/22/21 (!) 311 lb 3.2 oz (141.2 kg)  11/20/20 (!) 304 lb (137.9 kg)   BMI Readings from Last 3 Encounters:  04/10/21 46.45 kg/m  03/22/21 46.63 kg/m  11/20/20 45.55 kg/m    Assessment/Interventions: Review of patient past medical history, allergies, medications, health status, including review of consultants reports, laboratory and other test data, was performed as part of comprehensive evaluation and provision of chronic care management services.   SDOH:  (Social Determinants of Health) assessments and interventions performed: No  SDOH Screenings   Alcohol Screen: Not on file  Depression (PHQ2-9): Low Risk    PHQ-2 Score: 2  Financial Resource Strain: Low Risk    Difficulty of Paying Living Expenses: Not hard at all  Food Insecurity: No Food Insecurity   Worried About Charity fundraiser in the Last Year: Never true   Ran Out of Food in the Last Year: Never true  Housing: Low Risk    Last Housing Risk Score: 0  Physical Activity: Insufficiently Active   Days of Exercise per Week: 2 days    Minutes of Exercise per Session: 10 min  Social Connections: Moderately Integrated  Frequency of Communication with Friends and Family: Three times a week   Frequency of Social Gatherings with Friends and Family: Three times a week   Attends Religious Services: Never   Active Member of Clubs or Organizations: Yes   Attends Archivist Meetings: 1 to 4 times per year   Marital Status: Married  Stress: No Stress Concern Present   Feeling of Stress : Not at all  Tobacco Use: Low Risk    Smoking Tobacco Use: Never   Smokeless Tobacco Use: Never   Passive Exposure: Not on file  Transportation Needs: No Transportation Needs   Lack of Transportation (Medical): No   Lack of Transportation (Non-Medical): No    CCM Care Plan  No Known Allergies  Medications Reviewed Today     Reviewed by Dorothyann Peng, NP (Nurse Practitioner) on 04/10/21 at [phone]  Med List Status: <None>   Medication Order Taking? Sig Documenting Provider Last Dose Status Informant  amLODipine (NORVASC) 10 MG tablet [id number] Yes TAKE ONE TABLET BY MOUTH EVERY MORNING Nafziger, Tommi Rumps, NP Taking Active   aspirin EC 81 MG tablet [id number] Yes Take 81 mg by mouth daily. [provider] Taking Active Spouse/Significant Other  BD INSULIN SYRINGE U/F 31G X 5/16" 1 ML [place] [id number] Yes USE THREE TIMES DAILY BEFORE MEALS Philemon Kingdom, MD Taking Active   Continuous Blood Gluc Sensor (FREESTYLE LIBRE 2 SENSOR) [place] [id number] Yes USE TO check blood sugar DAILY AS DIRECTED. CHANGE every Cliff, MD Taking Active   fexofenadine (ALLEGRA) 180 MG tablet [id number] Yes Take 180 mg by mouth daily. [provider] Taking Active Self  glucose blood (ONETOUCH VERIO) test strip [id number] Yes USE TO check blood sugar THREE TIMES DAILY AS DIRECTED Philemon Kingdom, MD Taking Active   insulin aspart (FIASP FLEXTOUCH) 100 UNIT/ML FlexTouch Pen [id number] Yes Inject 20-40 units 3x a day before meals,  under skin Philemon Kingdom, MD Taking Active   insulin degludec (TRESIBA FLEXTOUCH) 200 UNIT/ML FlexTouch Pen [id number] Yes Use 100-140 units under skin daily Philemon Kingdom, MD Taking Active   insulin degludec (TRESIBA FLEXTOUCH) 200 UNIT/ML FlexTouch Pen [id number] Yes Use as directed. Sample. Lot: [id number], Exp: 11/20/22 Dorothyann Peng, NP Taking Active   metFORMIN (GLUCOPHAGE) 1000 MG tablet [id number] Yes Take 1 tablet (1,000 mg total) by mouth 2 (two) times daily with a meal. Philemon Kingdom, MD Taking Active   Discontinued 04/10/21 0837   Semaglutide, 2 MG/DOSE, (OZEMPIC, 2 MG/DOSE,) 8 MG/3ML SOPN [id number] Yes Inject 2 mg into the skin once a week. Philemon Kingdom, MD Taking Active   sertraline (ZOLOFT) 100 MG tablet [id number] Yes TAKE ONE TABLET BY MOUTH EVERY MORNING Nafziger, Tommi Rumps, NP Taking Active   simvastatin (ZOCOR) 20 MG tablet [id number] Yes TAKE ONE TABLET BY MOUTH EVERY Francesco Runner, Tommi Rumps, NP Taking Active   valsartan-hydrochlorothiazide (DIOVAN-HCT) 160-25 MG tablet [id number] Yes TAKE ONE TABLET BY MOUTH EVERY MORNING Nafziger, Tommi Rumps, NP Taking Active             Patient Active Problem List   Diagnosis Date Noted   Elevated TSH 04/19/2018   Type 2 diabetes mellitus with hyperglycemia, with long-term current use of insulin ([place]) 07/12/2015   Chronic right shoulder pain 05/08/2014   OSA (obstructive sleep apnea) 05/13/2013   Preventative health care 05/13/2013   Fatty liver 09/09/2012   BPV (benign positional vertigo) 09/09/2012   Nonspecific elevation of levels of transaminase or lactic acid dehydrogenase (LDH) 09/09/2012   OBESITY 09/10/2007   TESTOSTERONE  DEFICIENCY 12/25/2006   Hyperlipidemia 12/25/2006   Depression, recurrent ([place]) 12/25/2006   Essential hypertension 12/25/2006    Immunization History  Administered Date(s) Administered   Fluad Quad(high Dose 65+) 11/10/2018   Influenza,inj,Quad PF,6+ Mos 11/16/2012   PFIZER(Purple Top)SARS-COV-2  Vaccination 04/28/2019, 05/23/2019   Pneumococcal Polysaccharide-23 08/30/2008, 02/04/2019   Td 04/20/2004   Tdap 11/19/2014   Zoster, Live 07/10/2010   Patient and patient's wife still haven't purchased the bigger BP cuff to use to check BP at home.   Patient has been liking the [employer] 3 much better. The sensor seems to stay on his arm better and he feels like it's much more accurate compared to the 2 as well.  Conditions to be addressed/monitored:  Hypertension, Hyperlipidemia, Diabetes, GERD and Depression  Conditions addressed this visit: Diabetes, hypertension  Care Plan : CCM Pharmacy Care Plan  Updates made by Viona Gilmore, [place] since 06/26/2021 12:00 AM     Problem: Problem: Hypertension, Hyperlipidemia, Diabetes, GERD and Depression      Long-Range Goal: Patient-Specific Goal   Start Date: 06/27/2020  Expected End Date: 06/27/2021  Recent Progress: On track  Priority: High  Note:   Current Barriers:  Unable to independently afford treatment regimen Unable to independently monitor therapeutic efficacy  Pharmacist Clinical Goal(s):  Patient will verbalize ability to afford treatment regimen achieve adherence to monitoring guidelines and medication adherence to achieve therapeutic efficacy through collaboration with PharmD and provider.   Interventions: 1:1 collaboration with Dorothyann Peng, NP regarding development and update of comprehensive plan of care as evidenced by provider attestation and co-signature Inter-disciplinary care team collaboration (see longitudinal plan of care) Comprehensive medication review performed; medication list updated in electronic medical record  Hypertension (BP goal <130/80) -Controlled -Current treatment: Amlodipine 56m, 1 tablet once daily - Appropriate, Effective, Safe, Accessible Valsartan-hydrochlorothiazide 160-242m 1 tablet once daily - Appropriate, Effective, Safe, Accessible  -Medications previously tried:  benazepril, losartan  -Current home readings: does not check at home; needs to get a new BP cuff -Current dietary habits: did not discuss -Current exercise habits: did not discuss -Denies hypotensive/hypertensive symptoms -Educated on BP goals and benefits of medications for prevention of heart attack, stroke and kidney damage; Importance of home blood pressure monitoring; Proper BP monitoring technique; -Counseled to monitor BP at home weekly, document, and provide log at future appointments -Counseled on diet and exercise extensively Recommended to continue current medication  Hyperlipidemia: (LDL goal < 70) -Controlled -Current treatment: Simvastatin 2052m1 tablet once daily - Appropriate, Effective, Safe, Accessible  -Medications previously tried: fenofibrate -Current dietary patterns: did not discuss -Current exercise habits: did not discuss -Educated on Cholesterol goals;  Benefits of statin for ASCVD risk reduction; Importance of limiting foods high in cholesterol; -Counseled on diet and exercise extensively Recommended to continue current medication  Diabetes (A1c goal <7%) -Controlled -Current medications: Ozempic 2 mg inject once weekly - Appropriate, Effective, Safe, Accessible Tresiba inject 106 units daily - Appropriate, Effective, Query Safe, Accessible Fiasp inject inject 25-40 units before meals - Appropriate, Effective, Query Safe, Accessible -Medications previously tried: n/a  -Current home glucose readings fasting glucose: 144 average over the last 14 days post prandial glucose: refer to CGM data -Reports hypoglycemic/hyperglycemic symptoms -Current meal patterns:  breakfast: did not discuss  lunch: did not discuss   dinner: did not discuss  snacks: did not discuss  drinks: did not discuss  -Current exercise: did not discuss -Educated on A1c and blood sugar goals; Prevention and management of hypoglycemic episodes;  Carbohydrate counting and/or plate  method -Counseled to check feet daily and get yearly eye exams -Counseled on diet and exercise extensively Recommended to continue current medication  Depression (Goal: minimize symptoms) -Controlled -Current treatment: Sertraline 117m, 1 tablet once daily - Appropriate, Effective, Safe, Accessible -Medications previously tried/failed: none -PHQ9: 0 -Educated on Benefits of medication for symptom control -Recommended to continue current medication  GERD (Goal: minimize symptoms) -Controlled -Current treatment  Omeprazole 23m 1 capsule once daily (patient reported not taking everyday - Appropriate, Effective, Safe, Accessible -Medications previously tried: none  -Recommended to continue current medication  Health Maintenance -Vaccine gaps: shingrix, COVID booster (declines), Prevnar, influenza (declines) -Current therapy:  No medications -Educated on Cost vs benefit of each product must be carefully weighed by individual consumer -Patient is satisfied with current therapy and denies issues -Recommended to continue as is  Patient Goals/Self-Care Activities Patient will:  - take medications as prescribed check glucose multiple times a day, document, and provide at future appointments check blood pressure weekly, document, and provide at future appointments target a minimum of 150 minutes of moderate intensity exercise weekly  Follow Up Plan: Telephone follow up appointment with care management team member scheduled for: 6 months      Medication Assistance:  Ozempic, TrTyler Aas Fiasp obtained through [employer]ication assistance program.  Enrollment ends 01/19/22.   Compliance/Adherence/Medication fill history: Care Gaps: Shingrix, eye exam, Prevnar Last BP - 128/60 on 04/10/2021 Last A1C - 6.3 on 03/22/2021  Star-Rating Drugs: Semaglutide - patient assistance Metformin 1000 mg - last filled 05/01/2021 90 DS at [facility]  Simvastatin 20 mg - last filled  05/01/2021 90 DS at [facility]  Valsartan HCTZ - 160/25 mg - last filled 05/01/2021 90 DS at [facility]   Patient's preferred pharmacy is:  [facility] [place] [address] [id number]: [phone]: [phone] [address] [id number]: [phone]: [phone] Uses pill box? No - adherence packaging Pt endorses 100% compliance  We discussed: Benefits of medication synchronization, packaging and delivery as well as enhanced pharmacist oversight with [facility]. Patient decided to: Utilize [facility] pharmacy for medication synchronization, packaging and delivery  Care Plan and Follow Up Patient Decision:  Patient agrees to Care Plan and Follow-up.  Plan: Telephone follow up appointment with care management team member scheduled for:  6 months  MaJeni SallesPharmD, BCFondaharmacist [phone]

## 2021-07-19 DIAGNOSIS — E1159 Type 2 diabetes mellitus with other circulatory complications: Secondary | ICD-10-CM | POA: Diagnosis not present

## 2021-07-19 DIAGNOSIS — Z7984 Long term (current) use of oral hypoglycemic drugs: Secondary | ICD-10-CM | POA: Diagnosis not present

## 2021-07-19 DIAGNOSIS — F32A Depression, unspecified: Secondary | ICD-10-CM | POA: Diagnosis not present

## 2021-07-19 DIAGNOSIS — I10 Essential (primary) hypertension: Secondary | ICD-10-CM

## 2021-07-24 ENCOUNTER — Telehealth: Payer: Self-pay | Admitting: Pharmacist

## 2021-07-24 ENCOUNTER — Other Ambulatory Visit: Payer: Self-pay | Admitting: Internal Medicine

## 2021-07-24 NOTE — Chronic Care Management (AMB) (Signed)
Chronic Care Management Pharmacy Assistant   Name: Billy Hale  MRN: [id number] DOB: September 24, 1950  Reason for Encounter: Medication Review / Medication Coordination Call  Recent office visits:  None  Recent consult visits:  None  Hospital visits:  None  Medications: Outpatient Encounter Medications as of 07/24/2021  Medication Sig   amLODipine (NORVASC) 10 MG tablet TAKE ONE TABLET BY MOUTH EVERY MORNING   aspirin EC 81 MG tablet Take 81 mg by mouth daily.   Continuous Blood Gluc Sensor (FREESTYLE LIBRE 3 SENSOR) MISC Use as instructed to check blood sugar. Change every 14 days   fexofenadine (ALLEGRA) 180 MG tablet Take 180 mg by mouth daily.   glucose blood (ONETOUCH VERIO) test strip USE TO check blood sugar THREE TIMES DAILY AS DIRECTED   insulin aspart (FIASP FLEXTOUCH) 100 UNIT/ML FlexTouch Pen Inject 20-40 units 3x a day before meals, under skin   insulin degludec (TRESIBA FLEXTOUCH) 200 UNIT/ML FlexTouch Pen Use 100-140 units under skin daily   insulin degludec (TRESIBA FLEXTOUCH) 200 UNIT/ML FlexTouch Pen Use as directed. Sample. Lot: [id number], Exp: 11/20/22   metFORMIN (GLUCOPHAGE) 1000 MG tablet TAKE TWO TABLETS BY MOUTH EVERY MORNING   omeprazole (PRILOSEC) 20 MG capsule TAKE ONE TABLET BY MOUTH EVERY MORNING   Semaglutide, 2 MG/DOSE, (OZEMPIC, 2 MG/DOSE,) 8 MG/3ML SOPN Inject 2 mg into the skin once a week.   sertraline (ZOLOFT) 100 MG tablet TAKE ONE TABLET BY MOUTH EVERY MORNING   simvastatin (ZOCOR) 20 MG tablet TAKE ONE TABLET BY MOUTH EVERY EVENING   TRUEPLUS INSULIN SYRINGE 31G X 5/16" 1 ML MISC USE THREE TIMES DAILY BEFORE MEALS   valsartan-hydrochlorothiazide (DIOVAN-HCT) 160-25 MG tablet TAKE ONE TABLET BY MOUTH EVERY MORNING   No facility-administered encounter medications on file as of 07/24/2021.   Reviewed chart for medication changes ahead of medication coordination call.  No OVs, Consults, or hospital visits since last care coordination call/Pharmacist  visit. (If appropriate, list visit date, provider name)  No medication changes indicated OR if recent visit, treatment plan here.  BP Readings from Last 3 Encounters:  04/10/21 128/60  03/22/21 130/62  11/20/20 138/78    Lab Results  Component Value Date   HGBA1C 6.3 (A) 03/22/2021     Patient obtains medications through Adherence Packaging  90 Days    Last adherence delivery included: Valsartan/ HCTZ 160/25 mg; one tablet at breakfast Sertraline 100 mg; one tablet with breakfast Simvastatin 20 mg; one tablet with dinner Metformin 1000 mg; two tablets at breakfast OneTouch Verio test strips; check blood as directed Amlodipine 10 mg; one tablet at breakfast Allegra 180 mg: one tablet at breakfast Aspirin 81 mg; one tablet at breakfast Freestyle Libre kit Sensor: Check blood sugar as directed   Patient declined last month: Filled by [facility] Semaglutide (Ozempic) 1 mg Fiasp flextouch 100unit/ml Tresiba flextouch 200unit/ml   Patient is due for next adherence delivery on: 08/06/2021   Called patient and reviewed medications and coordinated delivery.   This delivery to include: Allegra 180 mg: one tablet at breakfast Aspirin 81 mg; one tablet at breakfast Amlodipine 10 mg; one tablet at breakfast Valsartan/ HCTZ 160/25 mg; one tablet at breakfast Sertraline 100 mg; one tablet with breakfast Simvastatin 20 mg; one tablet with dinner Metformin 1000 mg; two tablets at breakfast   Patient will need a short fill: No short fill needed    Patient declined the following medications: OneTouch Verio test strips (now using freestyle libre) Insuline syringes 44m x 843mx  31g (no longer needed, using flex pens) Freestyle [employer] (last filled 06/03/2021 84 DS)   Medications filled by PAP: Semaglutide (Ozempic) 1 mg Fiasp flextouch 100unit/ml Tresiba flextouch 200unit/ml  Confirmed delivery date of 08/06/2021, advised patient's wife that pharmacy will contact them the morning  of delivery.   Care Gaps: AWV - completed 10/03/2020 Last BP - 128/60 on 04/10/2021 Last A1C - 6.3 on 03/22/2021 Eye exam - overdue Shingrix - postponed Pneumonia vaccine - postponed  Star Rating Drugs: Semaglutide - patient assistance Metformin 1000 mg - last filled 05/01/2021 90 DS at [facility]  Simvastatin 20 mg - last filled 05/01/2021 90 DS at [facility]  Valsartan HCTZ - 160/25 mg - last filled 05/01/2021 90 DS at [facility] Pharmacist Assistant [phone]
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
## 2021-08-07 ENCOUNTER — Ambulatory Visit (INDEPENDENT_AMBULATORY_CARE_PROVIDER_SITE_OTHER): Payer: PPO | Admitting: Internal Medicine

## 2021-08-07 ENCOUNTER — Encounter: Payer: Self-pay | Admitting: Internal Medicine

## 2021-08-07 VITALS — BP 110/58 | HR 73 | Ht 68.5 in | Wt 306.8 lb

## 2021-08-07 DIAGNOSIS — Z794 Long term (current) use of insulin: Secondary | ICD-10-CM | POA: Diagnosis not present

## 2021-08-07 DIAGNOSIS — E1165 Type 2 diabetes mellitus with hyperglycemia: Secondary | ICD-10-CM

## 2021-08-07 DIAGNOSIS — E782 Mixed hyperlipidemia: Secondary | ICD-10-CM | POA: Diagnosis not present

## 2021-08-07 DIAGNOSIS — E038 Other specified hypothyroidism: Secondary | ICD-10-CM

## 2021-08-07 DIAGNOSIS — E669 Obesity, unspecified: Secondary | ICD-10-CM | POA: Diagnosis not present

## 2021-08-07 LAB — POCT GLYCOSYLATED HEMOGLOBIN (HGB A1C): Hemoglobin A1C: 5.9 % — AB (ref 4.0–5.6)

## 2021-08-07 NOTE — Patient Instructions (Addendum)
Please continue: - Metformin 1000 mg 2x a day  Decrease: - Tresiba 86 units daily  Please change FiAsp: - 25 units before b'fast - 30-40 units before dinner  Try to restart: - Ozempic 1 mg weekly  Please return in 4 months.

## 2021-08-28 ENCOUNTER — Encounter: Payer: Self-pay | Admitting: Internal Medicine

## 2021-09-01 ENCOUNTER — Telehealth: Payer: Self-pay

## 2021-09-01 ENCOUNTER — Ambulatory Visit
Admission: EM | Admit: 2021-09-01 | Discharge: 2021-09-01 | Disposition: A | Discharge status: Home / Self Care | Payer: PPO | Attending: Emergency Medicine | Admitting: Emergency Medicine

## 2021-09-01 DIAGNOSIS — K047 Periapical abscess without sinus: Secondary | ICD-10-CM

## 2021-09-01 MED ORDER — PENICILLIN V POTASSIUM 500 MG PO TABS: 500.0000 mg | ORAL_TABLET | Freq: Three times a day (TID) | ORAL | 0 refills | Status: DC

## 2021-09-01 MED ORDER — PENICILLIN V POTASSIUM 500 MG PO TABS: 500.0000 mg | ORAL_TABLET | Freq: Three times a day (TID) | ORAL | 0 refills | Status: AC
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
## 2021-09-01 NOTE — ED Triage Notes (Signed)
The pt states he has left sided facial swelling, the patient denies other symptoms .  Started: 2 days ago

## 2021-09-01 NOTE — Discharge Instructions (Signed)
Please begin penicillin 1 tablet 3 times daily for the next 14 days.  Please follow-up with your dentist as soon as possible to have the affected tooth evaluated and repaired if needed.  If you do not follow-up with your dentist the infection may continue to recur until the tooth is addressed.  Thank you for visiting urgent care today.

## 2021-10-01 ENCOUNTER — Telehealth: Payer: Self-pay | Admitting: Adult Health

## 2021-10-01 NOTE — Telephone Encounter (Signed)
Spoke with patient spouse to schedule awv  Spouse stated pt hates to do this appt, but she would ask and call back if ok with patient to schedule

## 2021-10-04 ENCOUNTER — Encounter: Payer: Self-pay | Admitting: Internal Medicine

## 2021-10-09 ENCOUNTER — Encounter: Payer: Self-pay | Admitting: Internal Medicine

## 2021-10-10 ENCOUNTER — Encounter: Payer: Self-pay | Admitting: Internal Medicine

## 2021-10-14 ENCOUNTER — Telehealth: Payer: Self-pay | Admitting: Pharmacist

## 2021-10-14 NOTE — Chronic Care Management (AMB) (Signed)
Chronic Care Management Pharmacy Assistant   Name: Billy Hale  MRN: [id number] DOB: 01/22/1950  Reason for Encounter: Disease State / Hypertension Assessment Call   Conditions to be addressed/monitored: HTN  Recent office visits:  None  Recent consult visits:  08/07/2021 Philemon Kingdom MD (endocrinology) - Patient was seen for Type 2 diabetes mellitus with hyperglycemia, with long-term current use of insulin and additional concerns. Changed instructions, Tyler Aas Flextouch 200 un/ml use 100-140 units daily. Follow up in 4 months.   Hospital visits:  Patient was seen at [facility] on 09/01/2021 (18 min) due to a dental abscess.  New?Medications Started at [facility] Discharge:?? Penicillin V Potassium 500 mg 3 times daily Medication Changes at Hospital Discharge: No medication changes.  Medications Discontinued at Hospital Discharge: No medications discontinued Medications that remain the same after Hospital Discharge:??  -All other medications will remain the same.    Medications: Outpatient Encounter Medications as of 10/14/2021  Medication Sig   amLODipine (NORVASC) 10 MG tablet TAKE ONE TABLET BY MOUTH EVERY MORNING   aspirin EC 81 MG tablet Take 81 mg by mouth daily.   Continuous Blood Gluc Sensor (FREESTYLE LIBRE 3 SENSOR) MISC Use as instructed to check blood sugar. Change every 14 days   fexofenadine (ALLEGRA) 180 MG tablet Take 180 mg by mouth daily.   glucose blood (ONETOUCH VERIO) test strip USE TO check blood sugar THREE TIMES DAILY AS DIRECTED   insulin aspart (FIASP FLEXTOUCH) 100 UNIT/ML FlexTouch Pen Inject 20-40 units 3x a day before meals, under skin   insulin degludec (TRESIBA FLEXTOUCH) 200 UNIT/ML FlexTouch Pen Use 100-140 units under skin daily   metFORMIN (GLUCOPHAGE) 1000 MG tablet TAKE TWO TABLETS BY MOUTH EVERY MORNING   omeprazole (PRILOSEC) 20 MG capsule TAKE ONE TABLET BY MOUTH EVERY MORNING   sertraline (ZOLOFT) 100 MG tablet TAKE ONE  TABLET BY MOUTH EVERY MORNING   simvastatin (ZOCOR) 20 MG tablet TAKE ONE TABLET BY MOUTH EVERY EVENING   TRUEPLUS INSULIN SYRINGE 31G X 5/16" 1 ML MISC USE THREE TIMES DAILY BEFORE MEALS   valsartan-hydrochlorothiazide (DIOVAN-HCT) 160-25 MG tablet TAKE ONE TABLET BY MOUTH EVERY MORNING   No facility-administered encounter medications on file as of 10/14/2021.  Fill History:  valsartan 160 mg-hydrochlorothiazide 25 mg tablet 07/29/2021 90   simvastatin 20 mg tablet 07/29/2021 90   sertraline 100 mg tablet 07/29/2021 90   omeprazole 20 mg capsule,delayed release 02/04/2021 90   metformin 1,000 mg tablet 07/29/2021 90   amlodipine 10 mg tablet 07/29/2021 90   Reviewed chart prior to disease state call. Spoke with patient regarding BP  Recent Office Vitals: BP Readings from Last 3 Encounters:  09/01/21 131/67  08/07/21 (!) 110/58  04/10/21 128/60   Pulse Readings from Last 3 Encounters:  09/01/21 87  08/07/21 73  04/10/21 83    Wt Readings from Last 3 Encounters:  08/07/21 (!) 306 lb 12.8 oz (139.2 kg)  04/10/21 (!) 310 lb (140.6 kg)  03/22/21 (!) 311 lb 3.2 oz (141.2 kg)     Kidney Function Lab Results  Component Value Date/Time   CREATININE 0.84 04/10/2021 08:57 AM   CREATININE 0.79 08/07/2020 01:50 PM   CREATININE 0.83 04/30/2020 09:16 AM   CREATININE 0.84 01/06/2018 09:21 AM   CREATININE 0.79 06/25/2012 05:03 PM   GFR 87.77 04/10/2021 08:57 AM   GFRNONAA >60 08/07/2020 01:50 PM   GFRNONAA 90 01/06/2018 09:21 AM   GFRAA 104 01/06/2018 09:21 AM  Latest Ref Rng & Units 04/10/2021    8:57 AM 08/07/2020    1:50 PM 04/30/2020    9:16 AM  BMP  Glucose 70 - 99 mg/dL 111  137  157   BUN 6 - 23 mg/dL 18  19  15    Creatinine 0.40 - 1.50 mg/dL 0.84  0.79  0.83   Sodium 135 - 145 mEq/L 141  138  137   Potassium 3.5 - 5.1 mEq/L 3.7  3.9  3.6   Chloride 96 - 112 mEq/L 105  102  102   CO2 19 - 32 mEq/L 30  24  26    Calcium 8.4 - 10.5 mg/dL 9.4  9.9  9.6      Current antihypertensive regimen:  Amlodipine 10 mg daily Diovan HCT 160/25 mg daily  How often are you checking your Blood Pressure?   Current home BP readings:   What recent interventions/DTPs have been made by any provider to improve Blood Pressure control since last CPP Visit: No recent interventions  Any recent hospitalizations or ED visits since last visit with CPP? Patient was seen at [facility] on 09/01/2021 (18 min) due to a dental abscess.  What diet changes have been made to improve Blood Pressure Control?  Patient follows Breakfast - patient Lunch - patient Dinner - patient  What exercise is being done to improve your Blood Pressure Control?    Adherence Review: Is the patient currently on ACE/ARB medication? Yes Does the patient have >5 day gap between last estimated fill dates? No  Care Gaps: AWV - 10/14/21 message to [name] Last BP - 131/67 on 09/01/2021 Last A1C - 5.9 on 08/07/2021 Shingrix - never done Urine ACR - overdue Eye exam - overdue Flu - due Pneumonia vaccine - postponed  Unable to reach patient after several attempts  Star Rating Drugs: Metformin 1000 mg - last filled 07/29/2021 90 DS at [facility]  Simvastatin 20 mg - last filled 07/29/2021 90 DS at [facility]  Valsartan HCTZ - 160/25 mg - last filled 07/29/2021 90 DS at [facility] [phone]

## 2021-10-23 ENCOUNTER — Other Ambulatory Visit: Payer: Self-pay | Admitting: Adult Health

## 2021-10-23 ENCOUNTER — Telehealth: Payer: Self-pay | Admitting: Pharmacist

## 2021-10-23 DIAGNOSIS — I10 Essential (primary) hypertension: Secondary | ICD-10-CM

## 2021-10-23 DIAGNOSIS — F339 Major depressive disorder, recurrent, unspecified: Secondary | ICD-10-CM

## 2021-10-23 NOTE — Chronic Care Management (AMB) (Unsigned)
Chronic Care Management Pharmacy Assistant   Name: Rohit Deloria  MRN: [id number] DOB: 1950-11-09  Reason for Encounter: Medication Review / Medication Coordination Call   Recent office visits:  None  Recent consult visits:  None  Hospital visits:  None  Medications: Outpatient Encounter Medications as of 10/23/2021  Medication Sig   amLODipine (NORVASC) 10 MG tablet TAKE ONE TABLET BY MOUTH EVERY MORNING   aspirin EC 81 MG tablet Take 81 mg by mouth daily.   Continuous Blood Gluc Sensor (FREESTYLE LIBRE 3 SENSOR) MISC Use as instructed to check blood sugar. Change every 14 days   fexofenadine (ALLEGRA) 180 MG tablet Take 180 mg by mouth daily.   glucose blood (ONETOUCH VERIO) test strip USE TO check blood sugar THREE TIMES DAILY AS DIRECTED   insulin aspart (FIASP FLEXTOUCH) 100 UNIT/ML FlexTouch Pen Inject 20-40 units 3x a day before meals, under skin   insulin degludec (TRESIBA FLEXTOUCH) 200 UNIT/ML FlexTouch Pen Use 100-140 units under skin daily   metFORMIN (GLUCOPHAGE) 1000 MG tablet TAKE TWO TABLETS BY MOUTH EVERY MORNING   omeprazole (PRILOSEC) 20 MG capsule TAKE ONE TABLET BY MOUTH EVERY MORNING   sertraline (ZOLOFT) 100 MG tablet TAKE ONE TABLET BY MOUTH EVERY MORNING   simvastatin (ZOCOR) 20 MG tablet TAKE ONE TABLET BY MOUTH EVERY EVENING   TRUEPLUS INSULIN SYRINGE 31G X 5/16" 1 ML MISC USE THREE TIMES DAILY BEFORE MEALS   valsartan-hydrochlorothiazide (DIOVAN-HCT) 160-25 MG tablet TAKE ONE TABLET BY MOUTH EVERY MORNING   No facility-administered encounter medications on file as of 10/23/2021.   Reviewed chart for medication changes ahead of medication coordination call.  No OVs, Consults, or hospital visits since last care coordination call/Pharmacist visit. (If appropriate, list visit date, provider name)  No medication changes indicated OR if recent visit, treatment plan here.  BP Readings from Last 3 Encounters:  09/01/21 131/67  08/07/21 (!) 110/58   04/10/21 128/60    Lab Results  Component Value Date   HGBA1C 5.9 (A) 08/07/2021     Patient obtains medications through Adherence Packaging  90 Days    Last adherence delivery included: Allegra 180 mg: one tablet at breakfast Aspirin 81 mg; one tablet at breakfast Amlodipine 10 mg; one tablet at breakfast Valsartan/ HCTZ 160/25 mg; one tablet at breakfast Sertraline 100 mg; one tablet with breakfast Simvastatin 20 mg; one tablet with dinner Metformin 1000 mg; two tablets at breakfast   Patient declined last month: Filled by [facility] OneTouch Verio test strips (now using freestyle libre) Insuline syringes 22m x 879mx 31g (no longer needed, using flex pens) Freestyle Li[employer]hire Hathawaylast filled 06/03/2021 84 DS)   Patient is due for next adherence delivery on: 11/04/2021   Called patient and reviewed medications and coordinated delivery.   This delivery to include: Metformin 1000 mg; two tablets at breakfast Simvastatin 20 mg; one tablet with dinner Allegra 180 mg: one tablet at breakfast Aspirin 81 mg; one tablet at breakfast Sertraline 100 mg; one tablet with breakfast Amlodipine 10 mg; one tablet at breakfast Valsartan/ HCTZ 160/25 mg; one tablet at breakfast    Patient will need a short fill: No short fill needed    Patient declined the following medications: Insuline syringes 34m834m 8mm36m31g (no longer needed, using flex pens) Freestyle [employer] due til 11/18/2021 on auto refill   Medications filled by [facility]: Semaglutide (Ozempic) 1 mg Fiasp flextouch 100unit/ml Tresiba flextouch 200unit/ml  Confirmed delivery date of 11/04/2021,  advised patient that pharmacy will contact them the morning of delivery.   Care Gaps: AWV - 10/14/21 message to [name] Last BP - 131/67 on 09/01/2021 Last A1C - 5.9 on 08/07/2021 Shingrix - never done Urine ACR - overdue Eye exam - overdue Flu - due Pneumonia vaccine - postponed  Star Rating Drugs: Metformin 1000  mg - last filled 07/29/2021 90 DS at [facility]  Simvastatin 20 mg - last filled 07/29/2021 90 DS at [facility]  Valsartan HCTZ - 160/25 mg - last filled 07/29/2021 90 DS at [facility]  SIG***

## 2021-11-13 ENCOUNTER — Telehealth: Payer: Self-pay

## 2021-11-13 NOTE — Telephone Encounter (Signed)
Pt contacted and advised Patient assistance Tresiba (5 boxes) Fiasp (4 boxes) and Ozempic (3 boxes) were delivered and ready for pick up.

## 2021-11-13 NOTE — Telephone Encounter (Signed)
Patient picked up [place] (5 boxes) Fiasp (4 boxes) and Ozempic (3 boxes). Also dropped of patient assistance paperwork - this was placed in provider box at front desk

## 2021-12-11 ENCOUNTER — Encounter: Payer: Self-pay | Admitting: Internal Medicine

## 2021-12-11 ENCOUNTER — Ambulatory Visit: Payer: PPO | Admitting: Internal Medicine

## 2021-12-11 VITALS — BP 110/58 | HR 77 | Ht 68.5 in | Wt 302.2 lb

## 2021-12-11 DIAGNOSIS — E782 Mixed hyperlipidemia: Secondary | ICD-10-CM | POA: Diagnosis not present

## 2021-12-11 DIAGNOSIS — E669 Obesity, unspecified: Secondary | ICD-10-CM | POA: Diagnosis not present

## 2021-12-11 DIAGNOSIS — E1165 Type 2 diabetes mellitus with hyperglycemia: Secondary | ICD-10-CM

## 2021-12-11 DIAGNOSIS — E038 Other specified hypothyroidism: Secondary | ICD-10-CM | POA: Diagnosis not present

## 2021-12-11 DIAGNOSIS — Z794 Long term (current) use of insulin: Secondary | ICD-10-CM | POA: Diagnosis not present

## 2021-12-11 LAB — POCT GLYCOSYLATED HEMOGLOBIN (HGB A1C): Hemoglobin A1C: 5.8 % — AB (ref 4.0–5.6)

## 2021-12-11 NOTE — Patient Instructions (Addendum)
Please continue: - Metformin 1000 mg 2x a day - Ozempic 1 mg weekly  Please decrease: - Tresiba 86 units daily - FiAsp: - 25 units before b'fast - 22-26 units before dinner  Please return in 4 months.
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
## 2021-12-20 ENCOUNTER — Telehealth: Payer: Self-pay | Admitting: Pharmacist

## 2021-12-20 NOTE — Chronic Care Management (AMB) (Signed)
    Chronic Care Management Pharmacy Assistant   Name: Billy Hale  MRN: [id number] DOB: 1950/07/25  12/23/2021 APPOINTMENT REMINDER  Billy Hale's wife was reminded to have all medications, supplements and any blood glucose and blood pressure readings available for review with [name], Pharm. D, at his telephone visit on 12/23/2021 at 1:00.  Care Gaps: AWV - 10/14/21 message to Carrie Mew Last BP - 110/58 on 12/11/2021 Last A1C - 5.8 on 12/11/2021 Shingrix - never done Urine ACR - overdue Eye exam - overdue Flu - due AWV - overdue Pneumonia - postponed  Star Rating Drug: Metformin 1000 mg - last filled 10/30/2021 90 DS at [facility]  Simvastatin 20 mg - last filled 10/30/2021 90 DS at [facility]  Valsartan HCTZ - 160/25 mg - last filled 10/30/2021 90 DS at [facility]  Any gaps in medications fill history? No  Inetta Fermo [facility]  Programme researcher, broadcasting/film/video [phone]

## 2021-12-23 ENCOUNTER — Telehealth: Payer: PPO

## 2021-12-23 ENCOUNTER — Telehealth: Payer: Self-pay | Admitting: Pharmacist

## 2021-12-23 NOTE — Progress Notes (Deleted)
Chronic Care Management Pharmacy Note  12/23/2021 Name:  Jacobs Golab MRN:  [id number] DOB:  [birth year]  Summary: A1c at goal of < 7% but BGs have been fluctuating some Pt is loving the Freestyle libre 3 sensors  Recommendations/Changes made from today's visit: -Recommended purchasing a BP cuff to monitor at home -Consider decreasing dose of Tresiba due to overbasalization (max should be 70 units based on weight)  Plan: Follow up BP assessment in 3 months Follow up in 6 months   Subjective: Jermane Brayboy is an 71 y.o. year old male who is a primary patient of Dorothyann Peng, NP.  The CCM team was consulted for assistance with disease management and care coordination needs.    Engaged with patient by telephone for follow up visit in response to provider referral for pharmacy case management and/or care coordination services.   Consent to Services:  The patient was given information about Chronic Care Management services, agreed to services, and gave verbal consent prior to initiation of services.  Please see initial visit note for detailed documentation.   Patient Care Team: Dorothyann Peng, NP as PCP - General (Family Medicine) Viona Gilmore, [facility] as Pharmacist (Pharmacist)  Recent office visits: 04/10/2021 Dorothyann Peng NP - Patient was seen for routine general medical exam and additional issues. Discontinued Omeprazole. Follow up in 1 year.   Recent consult visits: 12/11/21 Philemon Kingdom MD (endocrinology): Patient presented for DM follow up. Recommended decreasing Tresiba to 86 units and Fiasp to 22-26 units before dinner. Follow up in 4 months.  08/07/2021 Philemon Kingdom MD (endocrinology) - Patient was seen for Type 2 diabetes mellitus with hyperglycemia, with long-term current use of insulin and additional concerns. Changed instructions, Tyler Aas Flextouch 200 un/ml use 100-140 units daily. Follow up in 4 months.    08/07/20 Laverna Peace NP (Oncology) - seen for  thrombocytopenia. No medication changes. Follow up in 4 months.   Hospital visits: Patient was seen at [facility] on 09/01/2021 (18 min) due to a dental abscess.  New?Medications Started at [facility] Discharge:?? Penicillin V Potassium 500 mg 3 times daily Medication Changes at Hospital Discharge: No medication changes.  Medications Discontinued at Hospital Discharge: No medications discontinued Medications that remain the same after Hospital Discharge:??  -All other medications will remain the same.   Objective:  Lab Results  Component Value Date   CREATININE 0.84 04/10/2021   BUN 18 04/10/2021   GFR 87.77 04/10/2021   GFRNONAA >60 08/07/2020   GFRAA 104 01/06/2018   NA 141 04/10/2021   K 3.7 04/10/2021   CALCIUM 9.4 04/10/2021   CO2 30 04/10/2021   GLUCOSE 111 (H) 04/10/2021    Lab Results  Component Value Date/Time   HGBA1C 5.8 (A) 12/11/2021 10:34 AM   HGBA1C 5.9 (A) 08/07/2021 10:55 AM   HGBA1C 8.6 (H) 01/23/2017 09:46 AM   HGBA1C 8.1 (H) 04/20/2015 09:02 AM   GFR 87.77 04/10/2021 08:57 AM   GFR 91.91 02/24/2020 10:57 AM   MICROALBUR 61.9 (H) 01/06/2018 09:21 AM   MICROALBUR 43.0 (H) 04/20/2015 09:02 AM    Last diabetic Eye exam:  Lab Results  Component Value Date/Time   HMDIABEYEEXA No Retinopathy 06/10/2017 12:00 AM    Last diabetic Foot exam:  Lab Results  Component Value Date/Time   HMDIABFOOTEX done 11/08/2010 12:00 AM     Lab Results  Component Value Date   CHOL 126 04/10/2021   HDL 44.50 04/10/2021   LDLCALC 66 04/10/2021   LDLDIRECT 72.9 11/01/2010  TRIG 80.0 04/10/2021   CHOLHDL 3 04/10/2021       Latest Ref Rng & Units 04/10/2021    8:57 AM 08/07/2020    1:50 PM 04/30/2020    9:16 AM  Hepatic Function  Total Protein 6.0 - 8.3 g/dL 7.3  7.4  7.3   Albumin 3.5 - 5.2 g/dL 4.1  4.0  4.2   AST 0 - 37 U/L _0 ALT 0 - 53 U/L _1 Alk Phosphatase 39 - 117 U/L 126  108  99   Total Bilirubin 0.2 - 1.2 mg/dL 0.6   0.5  0.4     Lab Results  Component Value Date/Time   TSH 5.40 04/10/2021 08:57 AM   TSH 3.64 02/24/2020 10:57 AM   FREET4 0.64 02/04/2019 09:43 AM   FREET4 0.56 (L) 01/06/2018 09:21 AM       Latest Ref Rng & Units 04/10/2021    8:57 AM 08/07/2020    1:50 PM 04/30/2020    9:16 AM  CBC  WBC 4.0 - 10.5 K/uL 4.3  6.7  5.7   Hemoglobin 13.0 - 17.0 g/dL 14.2  13.9  14.4   Hematocrit 39.0 - 52.0 % 41.2  39.4  41.0   Platelets 150.0 - 400.0 K/uL 73.0  100  84     No results found for: "VD25OH"  Clinical ASCVD: No  The ASCVD Risk score (Arnett DK, et al., 2019) failed to calculate for the following reasons:   The valid total cholesterol range is 130 to 320 mg/dL       04/10/2021    8:21 AM 10/03/2020   11:11 AM 02/24/2020   10:18 AM  Depression screen PHQ 2/9  Decreased Interest 0 0 0  Down, Depressed, Hopeless 0 0 0  PHQ - 2 Score 0 0 0  Altered sleeping 1    Tired, decreased energy 1    Change in appetite 0    Feeling bad or failure about yourself  0    Trouble concentrating 0    Moving slowly or fidgety/restless 0    Suicidal thoughts 0    PHQ-9 Score 2    Difficult doing work/chores Not difficult at all        Social History   Tobacco Use  Smoking Status Never  Smokeless Tobacco Never   BP Readings from Last 3 Encounters:  12/11/21 (!) 110/58  09/01/21 131/67  08/07/21 (!) 110/58   Pulse Readings from Last 3 Encounters:  12/11/21 77  09/01/21 87  08/07/21 73   Wt Readings from Last 3 Encounters:  12/11/21 (!) 302 lb 3.2 oz (137.1 kg)  08/07/21 (!) 306 lb 12.8 oz (139.2 kg)  04/10/21 (!) 310 lb (140.6 kg)   BMI Readings from Last 3 Encounters:  12/11/21 45.28 kg/m  08/07/21 45.97 kg/m  04/10/21 46.45 kg/m    Assessment/Interventions: Review of patient past medical history, allergies, medications, health status, including review of consultants reports, laboratory and other test data, was performed as part of comprehensive evaluation and provision of  chronic care management services.   SDOH:  (Social Determinants of Health) assessments and interventions performed: Yes *** SDOH Interventions    Flowsheet Row Clinical Support from 11/07/2019 in [place] [facility] [place] Management from 05/16/2019 in [place] [facility] [place] Interventions Intervention Not Indicated --  Housing Interventions Intervention Not Indicated --  Transportation Interventions Intervention  Not Indicated --  Depression Interventions/Treatment  PHQ2-9 Score <4 Follow-up Not Indicated --  Financial Strain Interventions Intervention Not Indicated Other (Comment)  [Not needed]  Physical Activity Interventions Intervention Not Indicated --  Stress Interventions Intervention Not Indicated --  Social Connections Interventions Intervention Not Indicated --      SDOH Screenings   Food Insecurity: No Food Insecurity (10/03/2020)  Housing: Low Risk  (10/03/2020)  Transportation Needs: No Transportation Needs (10/03/2020)  Alcohol Screen: Low Risk  (11/07/2019)  Depression (PHQ2-9): Low Risk  (04/10/2021)  Financial Resource Strain: Low Risk  (10/03/2020)  Physical Activity: Insufficiently Active (10/03/2020)  Social Connections: Moderately Integrated (10/03/2020)  Stress: No Stress Concern Present (10/03/2020)  Tobacco Use: Low Risk  (12/11/2021)    CCM Care Plan  No Known Allergies  Medications Reviewed Today     Reviewed by Philemon Kingdom, MD (Physician) on 12/11/21 at 1034  Med List Status: <None>   Medication Order Taking? Sig Documenting Provider Last Dose Status Informant  amLODipine (NORVASC) 10 MG tablet [id number]  TAKE ONE TABLET BY MOUTH EVERY MORNING Nafziger, Tommi Rumps, NP  Active   aspirin EC 81 MG tablet [id number] No Take 81 mg by mouth daily. [provider] Taking Active Spouse/Significant Other  Continuous Blood Gluc Sensor (FREESTYLE LIBRE 3 SENSOR) MISC [id number]  Use as  instructed to check blood sugar. Change every 14 days Philemon Kingdom, MD  Active   fexofenadine [facility]) 180 MG tablet [id number] No Take 180 mg by mouth daily. [provider] Taking Active Self  glucose blood (ONETOUCH VERIO) test strip [id number] No USE TO check blood sugar THREE TIMES DAILY AS DIRECTED Philemon Kingdom, MD Taking Active   insulin aspart (FIASP FLEXTOUCH) 100 UNIT/ML FlexTouch Pen [id number] No Inject 20-40 units 3x a day before meals, under skin Philemon Kingdom, MD Taking Active   insulin degludec (TRESIBA FLEXTOUCH) 200 UNIT/ML FlexTouch Pen [id number] No Use 100-140 units under skin daily Philemon Kingdom, MD Taking Active   metFORMIN (GLUCOPHAGE) 1000 MG tablet [id number]  TAKE TWO TABLETS BY MOUTH EVERY Maryln Gottron, MD  Active   omeprazole (PRILOSEC) 20 MG capsule [id number]  TAKE ONE TABLET BY MOUTH EVERY MORNING Nafziger, Tommi Rumps, NP  Active   sertraline (ZOLOFT) 100 MG tablet [id number]  TAKE ONE TABLET BY MOUTH EVERY MORNING Nafziger, Tommi Rumps, NP  Active   simvastatin (ZOCOR) 20 MG tablet [id number]  TAKE ONE TABLET BY MOUTH EVERY Francesco Runner, Tommi Rumps, NP  Active   TRUEPLUS INSULIN SYRINGE 31G X 5/16" 1 ML [place] [id number]  USE THREE TIMES DAILY BEFORE MEALS Philemon Kingdom, MD  Active   valsartan-hydrochlorothiazide (DIOVAN-HCT) 160-25 MG tablet [id number]  TAKE ONE TABLET BY MOUTH EVERY MORNING Nafziger, Tommi Rumps, NP  Active             Patient Active Problem List   Diagnosis Date Noted   Elevated TSH 04/19/2018   Type 2 diabetes mellitus with hyperglycemia, with long-term current use of insulin ([place]) 07/12/2015   Chronic right shoulder pain 05/08/2014   OSA (obstructive sleep apnea) 05/13/2013   Preventative health care 05/13/2013   Fatty liver 09/09/2012   BPV (benign positional vertigo) 09/09/2012   Nonspecific elevation of levels of transaminase or lactic acid dehydrogenase (LDH) 09/09/2012   OBESITY 09/10/2007   TESTOSTERONE DEFICIENCY  12/25/2006   Hyperlipidemia 12/25/2006   Depression, recurrent ([place]) 12/25/2006   Essential hypertension 12/25/2006    Immunization History  Administered Date(s) Administered   Fluad Quad(high Dose 65+) 11/10/2018   Influenza,inj,Quad PF,6+ Mos 11/16/2012  PFIZER(Purple Top)SARS-COV-2 Vaccination 04/28/2019, 05/23/2019   Pneumococcal Polysaccharide-23 08/30/2008, 02/04/2019   Td 04/20/2004   Tdap 11/19/2014   Zoster, Live 07/10/2010   Patient and patient's wife still haven't purchased the bigger BP cuff to use to check BP at home.   Patient has been liking the [employer] 3 much better. The sensor seems to stay on his arm better and he feels like it's much more accurate compared to the 2 as well.  -schedule annual exam with Tommi Rumps?  CGM report:    -doing more of the 22 or 25 units before lunch? Maybe consider lower end of that range  -add to spreadsheet to check on status?  Conditions to be addressed/monitored:  Hypertension, Hyperlipidemia, Diabetes, GERD and Depression  Conditions addressed this visit: Diabetes, hypertension  There are no care plans that you recently modified to display for this patient.    Medication Assistance:  Joni Reining & Claiborne Billings obtained through [facility] medication assistance program.  Enrollment ends 01/19/22.   Compliance/Adherence/Medication fill history: Care Gaps: Shingrix, eye exam, Prevnar, influenza, urine microalbumin, AWV Last BP - 110/58 on 12/11/2021 Last A1C - 5.8 on 12/11/2021  Star-Rating Drugs: Semaglutide - patient assistance Metformin 1000 mg - last filled 10/30/2021 90 [facility] at [facility]  Simvastatin 20 mg - last filled 10/30/2021 90 [facility] at [facility]  Valsartan HCTZ - 160/25 mg - last filled 10/30/2021 90 [facility] at [facility]   Patient's preferred pharmacy is:  [facility] - [place], [place] - [address] [place] [address] [place] [address] Phone: [phone] Fax:  [phone]  [id number] [place], [place] [address]Phone: [phone]: [phone]  Uses pill box? No - adherence packaging Pt endorses 100% compliance  We discussed: Benefits of medication synchronization, packaging and delivery as well as enhanced pharmacist oversight with [facility]. Patient decided to: Utilize [facility] pharmacy for medication synchronization, packaging and delivery  Care Plan and Follow Up Patient Decision:  Patient agrees to Care Plan and Follow-up.  Plan: Telephone follow up appointment with care management team member scheduled for:  6 months  MJeni Salles PharmD, BAngierPharmacist LVenturiaat [phone]

## 2021-12-23 NOTE — Telephone Encounter (Signed)
  Chronic Care Management   Outreach Note  12/23/2021 Name: Billy Hale MRN: [id number] DOB: Apr 20, 1950  Referred by: Shirline Frees, NP  Patient had a phone appointment scheduled with clinical pharmacist today.  An unsuccessful telephone outreach was attempted today. The patient was referred to the pharmacist for assistance with care management and care coordination.   If possible, a message was left to return call to: [phone] or to [facility] at [facility]: [phone]  [name], PharmD, [facility] Clinical Pharmacist [facility] [place] [phone]

## 2021-12-24 NOTE — Progress Notes (Signed)
Chronic Care Management Pharmacy Note  12/26/2021 Name:  Billy Hale MRN:  [id number] DOB:  [birth year]  Summary: A1c at goal of < 7%  Pt is having only 3% of low BGs per CGM data  Recommendations/Changes made from today's visit: -Recommended purchasing a BP cuff to monitor at home -Consider decreasing dose of Fiasp to avoid afternoon lows  Plan: Scheduled CPE for 2024 Follow up BP assessment in 3 months Follow up in 6 months   Subjective: Billy Hale is an 71 y.o. year old male who is a primary patient of Billy Frees, NP.  The CCM team was consulted for assistance with disease management and care coordination needs.    Engaged with patient by telephone for follow up visit in response to provider referral for pharmacy case management and/or care coordination services.   Consent to Services:  The patient was given information about Chronic Care Management services, agreed to services, and gave verbal consent prior to initiation of services.  Please see initial visit note for detailed documentation.   Patient Care Team: Billy Frees, NP as PCP - General (Family Medicine) Billy Hale, [facility] as Pharmacist (Pharmacist)  Recent office visits: 04/10/2021 Billy Frees NP - Patient was seen for routine general medical exam and additional issues. Discontinued Omeprazole. Follow up in 1 year.   Recent consult visits: 12/11/21 Billy Pavlov MD (endocrinology): Patient presented for DM follow up. Recommended decreasing Tresiba to 86 units and Fiasp to 22-26 units before dinner. Follow up in 4 months.  08/07/2021 Billy Pavlov MD (endocrinology) - Patient was seen for Type 2 diabetes mellitus with hyperglycemia, with long-term current use of insulin and additional concerns. Changed instructions, Evaristo Bury Flextouch 200 un/ml use 100-140 units daily. Follow up in 4 months.    08/07/20 Billy Gins NP (Oncology) - seen for thrombocytopenia. No medication changes. Follow up  in 4 months.   Hospital visits: Patient was seen at [facility] on 09/01/2021 (18 min) due to a dental abscess.  New?Medications Started at [facility] Discharge:?? Penicillin V Potassium 500 mg 3 times daily Medication Changes at Hospital Discharge: No medication changes.  Medications Discontinued at Hospital Discharge: No medications discontinued Medications that remain the same after Hospital Discharge:??  -All other medications will remain the same.   Objective:  Lab Results  Component Value Date   CREATININE 0.84 04/10/2021   BUN 18 04/10/2021   GFR 87.77 04/10/2021   GFRNONAA >60 08/07/2020   GFRAA 104 01/06/2018   NA 141 04/10/2021   K 3.7 04/10/2021   CALCIUM 9.4 04/10/2021   CO2 30 04/10/2021   GLUCOSE 111 (H) 04/10/2021    Lab Results  Component Value Date/Time   HGBA1C 5.8 (A) 12/11/2021 10:34 AM   HGBA1C 5.9 (A) 08/07/2021 10:55 AM   HGBA1C 8.6 (H) 01/23/2017 09:46 AM   HGBA1C 8.1 (H) 04/20/2015 09:02 AM   GFR 87.77 04/10/2021 08:57 AM   GFR 91.91 02/24/2020 10:57 AM   MICROALBUR 61.9 (H) 01/06/2018 09:21 AM   MICROALBUR 43.0 (H) 04/20/2015 09:02 AM    Last diabetic Eye exam:  Lab Results  Component Value Date/Time   HMDIABEYEEXA No Retinopathy 06/10/2017 12:00 AM    Last diabetic Foot exam:  Lab Results  Component Value Date/Time   HMDIABFOOTEX done 11/08/2010 12:00 AM     Lab Results  Component Value Date   Hale 126 04/10/2021   HDL 44.50 04/10/2021   LDLCALC 66 04/10/2021   LDLDIRECT 72.9 11/01/2010   TRIG 80.0 04/10/2021  CHOLHDL 3 04/10/2021       Latest Ref Rng & Units 04/10/2021    8:57 AM 08/07/2020    1:50 PM 04/30/2020    9:16 AM  Hepatic Function  Total Protein 6.0 - 8.3 g/dL 7.3  7.4  7.3   Albumin 3.5 - 5.2 g/dL 4.1  4.0  4.2   AST 0 - 37 U/L 28  24  26    ALT 0 - 53 U/L 31  31  28    Alk Phosphatase 39 - 117 U/L 126  108  99   Total Bilirubin 0.2 - 1.2 mg/dL 0.6  0.5  0.4     Lab Results  Component Value  Date/Time   TSH 5.40 04/10/2021 08:57 AM   TSH 3.64 02/24/2020 10:57 AM   FREET4 0.64 02/04/2019 09:43 AM   FREET4 0.56 (L) 01/06/2018 09:21 AM       Latest Ref Rng & Units 04/10/2021    8:57 AM 08/07/2020    1:50 PM 04/30/2020    9:16 AM  CBC  WBC 4.0 - 10.5 K/uL 4.3  6.7  5.7   Hemoglobin 13.0 - 17.0 g/dL 96.0  45.4  09.8   Hematocrit 39.0 - 52.0 % 41.2  39.4  41.0   Platelets 150.0 - 400.0 K/uL 73.0  100  84     No results found for: "VD25OH"  Clinical ASCVD: No  The ASCVD Risk score (Arnett DK, et al., 2019) failed to calculate for the following reasons:   The valid total cholesterol range is 130 to 320 mg/dL       02/08/1476    2:95 AM 10/03/2020   11:11 AM 02/24/2020   10:18 AM  Depression screen PHQ 2/9  Decreased Interest 0 0 0  Down, Depressed, Hopeless 0 0 0  PHQ - 2 Score 0 0 0  Altered sleeping 1    Tired, decreased energy 1    Change in appetite 0    Feeling bad or failure about yourself  0    Trouble concentrating 0    Moving slowly or fidgety/restless 0    Suicidal thoughts 0    PHQ-9 Score 2    Difficult doing work/chores Not difficult at all        Social History   Tobacco Use  Smoking Status Never  Smokeless Tobacco Never   BP Readings from Last 3 Encounters:  12/11/21 (!) 110/58  09/01/21 131/67  08/07/21 (!) 110/58   Pulse Readings from Last 3 Encounters:  12/11/21 77  09/01/21 87  08/07/21 73   Wt Readings from Last 3 Encounters:  12/11/21 (!) 302 lb 3.2 oz (137.1 kg)  08/07/21 (!) 306 lb 12.8 oz (139.2 kg)  04/10/21 (!) 310 lb (140.6 kg)   BMI Readings from Last 3 Encounters:  12/11/21 45.28 kg/m  08/07/21 45.97 kg/m  04/10/21 46.45 kg/m    Assessment/Interventions: Review of patient past medical history, allergies, medications, health status, including review of consultants reports, laboratory and other test data, was performed as part of comprehensive evaluation and provision of chronic care management services.   SDOH:   (Social Determinants of Health) assessments and interventions performed: Yes  SDOH Interventions    Flowsheet Row Chronic Care Management from 12/25/2021 in [facility] [place] Clinical Support from 11/07/2019 in [facility] [place] Chronic Care Management from 05/16/2019 in [facility] [place]  SDOH Interventions     Food Insecurity Interventions -- Intervention Not Indicated --  Housing Interventions --  Intervention Not Indicated --  Transportation Interventions -- Intervention Not Indicated --  Depression Interventions/Treatment  -- UEA5-4 Score <4 Follow-up Not Indicated --  Financial Strain Interventions Other (Comment)  [following up on patient assistance applications] Intervention Not Indicated Other (Comment)  [Not needed]  Physical Activity Interventions -- Intervention Not Indicated --  Stress Interventions -- Intervention Not Indicated --  Social Connections Interventions -- Intervention Not Indicated --      SDOH Screenings   Food Insecurity: No Food Insecurity (10/03/2020)  Housing: Low Risk  (10/03/2020)  Transportation Needs: No Transportation Needs (10/03/2020)  Alcohol Screen: Low Risk  (11/07/2019)  Depression (PHQ2-9): Low Risk  (04/10/2021)  Financial Resource Strain: Low Risk  (12/26/2021)  Physical Activity: Insufficiently Active (10/03/2020)  Social Connections: Moderately Integrated (10/03/2020)  Stress: No Stress Concern Present (10/03/2020)  Tobacco Use: Low Risk  (12/11/2021)    CCM Care Plan  No Known Allergies  Medications Reviewed Today     Reviewed by Billy Pavlov, MD (Physician) on 12/11/21 at 1034  Med List Status: <None>   Medication Order Taking? Sig Documenting Provider Last Dose Status Informant  amLODipine (NORVASC) 10 MG tablet [id number]  TAKE ONE TABLET BY MOUTH EVERY MORNING Nafziger, Kandee Keen, NP  Active   aspirin EC 81 MG tablet [id number] No Take 81 mg by mouth daily. [provider] Taking  Active Spouse/Significant Other  Continuous Blood Gluc Sensor (FREESTYLE LIBRE 3 SENSOR) MISC [id number]  Use as instructed to check blood sugar. Change every 14 days Billy Pavlov, MD  Active   fexofenadine [facility]) 180 MG tablet [id number] No Take 180 mg by mouth daily. [provider] Taking Active Self  glucose blood (ONETOUCH VERIO) test strip [id number] No USE TO check blood sugar THREE TIMES DAILY AS DIRECTED Billy Pavlov, MD Taking Active   insulin aspart (FIASP FLEXTOUCH) 100 UNIT/ML FlexTouch Pen [id number] No Inject 20-40 units 3x a day before meals, under skin Billy Pavlov, MD Taking Active   insulin degludec (TRESIBA FLEXTOUCH) 200 UNIT/ML FlexTouch Pen [id number] No Use 100-140 units under skin daily Billy Pavlov, MD Taking Active   metFORMIN (GLUCOPHAGE) 1000 MG tablet [id number]  TAKE TWO TABLETS BY MOUTH EVERY Clayburn Pert, MD  Active   omeprazole (PRILOSEC) 20 MG capsule [id number]  TAKE ONE TABLET BY MOUTH EVERY MORNING Nafziger, Kandee Keen, NP  Active   sertraline (ZOLOFT) 100 MG tablet [id number]  TAKE ONE TABLET BY MOUTH EVERY MORNING Nafziger, Kandee Keen, NP  Active   simvastatin (ZOCOR) 20 MG tablet [id number]  TAKE ONE TABLET BY MOUTH EVERY Carollee Herter, Kandee Keen, NP  Active   TRUEPLUS INSULIN SYRINGE 31G X 5/16" 1 ML MISC [id number]  USE THREE TIMES DAILY BEFORE MEALS Billy Pavlov, MD  Active   valsartan-hydrochlorothiazide (DIOVAN-HCT) 160-25 MG tablet [id number]  TAKE ONE TABLET BY MOUTH EVERY MORNING Nafziger, Kandee Keen, NP  Active             Patient Active Problem List   Diagnosis Date Noted   Elevated TSH 04/19/2018   Type 2 diabetes mellitus with hyperglycemia, with long-term current use of insulin (HCC) 07/12/2015   Chronic right shoulder pain 05/08/2014   OSA (obstructive sleep apnea) 05/13/2013   Preventative health care 05/13/2013   Fatty liver 09/09/2012   BPV (benign positional vertigo) 09/09/2012   Nonspecific elevation of levels  of transaminase or lactic acid dehydrogenase (LDH) 09/09/2012   OBESITY 09/10/2007   TESTOSTERONE DEFICIENCY 12/25/2006   Hyperlipidemia 12/25/2006   Depression, recurrent (HCC) 12/25/2006   Essential hypertension 12/25/2006  Immunization History  Administered Date(s) Administered   Fluad Quad(high Dose 65+) 11/10/2018   Influenza,inj,Quad PF,6+ Mos 11/16/2012   PFIZER(Purple Top)SARS-COV-2 Vaccination 04/28/2019, 05/23/2019   Pneumococcal Polysaccharide-23 08/30/2008, 02/04/2019   Td 04/20/2004   Tdap 11/19/2014   Zoster, Live 07/10/2010   Patient and patient's wife still haven't purchased the bigger BP cuff to use to check BP at home. Patient's wife will look on [employer] to see if she can purchase one.  CGM report:      Conditions to be addressed/monitored:  Hypertension, Hyperlipidemia, Diabetes, GERD and Depression  Conditions addressed this visit: Diabetes, hypertension  Care Plan : CCM Pharmacy Care Plan  Updates made by Billy Hale, RPH since 12/26/2021 12:00 AM     Problem: Problem: Hypertension, Hyperlipidemia, Diabetes, GERD and Depression      Long-Range Goal: Patient-Specific Goal   Start Date: 06/27/2020  Expected End Date: 06/27/2021  Recent Progress: On track  Priority: High  Note:   Current Barriers:  Unable to independently afford treatment regimen Unable to independently monitor therapeutic efficacy  Pharmacist Clinical Goal(s):  Patient will verbalize ability to afford treatment regimen achieve adherence to monitoring guidelines and medication adherence to achieve therapeutic efficacy through collaboration with PharmD and provider.   Interventions: 1:1 collaboration with Billy Frees, NP regarding development and update of comprehensive plan of care as evidenced by provider attestation and co-signature Inter-disciplinary care team collaboration (see longitudinal plan of care) Comprehensive medication review performed; medication list  updated in electronic medical record  Hypertension (BP goal <130/80) -Controlled -Current treatment: Amlodipine 10mg , 1 tablet once daily - Appropriate, Effective, Safe, Accessible Valsartan-hydrochlorothiazide 160-25mg , 1 tablet once daily - Appropriate, Effective, Safe, Accessible  -Medications previously tried: benazepril, losartan  -Current home readings: does not check at home; needs to get a new BP cuff -Current dietary habits: did not discuss -Current exercise habits: did not discuss -Denies hypotensive/hypertensive symptoms -Educated on BP goals and benefits of medications for prevention of heart attack, stroke and kidney damage; Importance of home blood pressure monitoring; Proper BP monitoring technique; -Counseled to monitor BP at home weekly, document, and provide log at future appointments -Counseled on diet and exercise extensively Recommended to continue current medication  Hyperlipidemia: (LDL goal < 70) -Controlled -Current treatment: Simvastatin 20mg , 1 tablet once daily - Appropriate, Effective, Safe, Accessible  -Medications previously tried: fenofibrate -Current dietary patterns: did not discuss -Current exercise habits: did not discuss -Educated on Cholesterol goals;  Benefits of statin for ASCVD risk reduction; Importance of limiting foods high in cholesterol; -Counseled on diet and exercise extensively Recommended to continue current medication  Diabetes (A1c goal <7%) -Controlled -Current medications: Ozempic 2 mg inject once weekly - Appropriate, Effective, Safe, Accessible Tresiba inject 106 units daily - Appropriate, Effective, Query Safe, Accessible Fiasp inject inject 22-25 units before meals - Appropriate, Effective, Query Safe, Accessible -Medications previously tried: n/a  -Current home glucose readings fasting glucose: 144 average over the last 14 days post prandial glucose: refer to CGM data -Reports hypoglycemic/hyperglycemic  symptoms -Current meal patterns:  breakfast: did not discuss  lunch: did not discuss   dinner: did not discuss  snacks: did not discuss  drinks: did not discuss  -Current exercise: did not discuss -Educated on A1c and blood sugar goals; Prevention and management of hypoglycemic episodes; Carbohydrate counting and/or plate method -Counseled to check feet daily and get yearly eye exams -Counseled on diet and exercise extensively Recommended to continue current medication  Depression (Goal: minimize symptoms) -Controlled -Current treatment: Sertraline  100mg , 1 tablet once daily - Appropriate, Effective, Safe, Accessible -Medications previously tried/failed: none -PHQ9: 0 -Educated on Benefits of medication for symptom control -Recommended to continue current medication  GERD (Goal: minimize symptoms) -Controlled -Current treatment  Omeprazole 20mg , 1 capsule once daily (patient reported not taking everyday - Appropriate, Effective, Safe, Accessible -Medications previously tried: none  -Recommended to continue current medication  Health Maintenance -Vaccine gaps: shingrix, COVID booster (declines), Prevnar, influenza (declines) -Current therapy:  No medications -Educated on Cost vs benefit of each product must be carefully weighed by individual consumer -Patient is satisfied with current therapy and denies issues -Recommended to continue as is  Patient Goals/Self-Care Activities Patient will:  - take medications as prescribed check glucose multiple times a day, document, and provide at future appointments check blood pressure weekly, document, and provide at future appointments target a minimum of 150 minutes of moderate intensity exercise weekly  Follow Up Plan: Telephone follow up appointment with care management team member scheduled for: 6 months      Medication Assistance:  Ozempic, Evaristo Bury & Fiasp obtained through [employer] medication assistance program.   Enrollment ends 01/19/22.   Compliance/Adherence/Medication fill history: Care Gaps: Shingrix, eye exam, Prevnar, influenza, urine microalbumin, AWV Last BP - 110/58 on 12/11/2021 Last A1C - 5.8 on 12/11/2021  Star-Rating Drugs: Semaglutide - patient assistance Metformin 1000 mg - last filled 10/30/2021 [facility] at [facility]  Simvastatin 20 mg - last filled 10/30/2021 90 [facility] at [facility]  Valsartan HCTZ - 160/25 mg - last filled 10/30/2021 [facility] at [facility]   Patient's preferred pharmacy is:  [facility] - [place], [place] - [address] [place] [address] Phone: [phone] Fax: [phone]  [id number] - [address] [place] - [address] [place] [address] Phone: [phone] Fax: [phone]   Uses pill box? No - adherence packaging Pt endorses 100% compliance  We discussed: Benefits of medication synchronization, packaging and delivery as well as enhanced pharmacist oversight with [facility]. Patient decided to: Utilize [facility] pharmacy for medication synchronization, packaging and delivery  Care Plan and Follow Up Patient Decision:  Patient agrees to Care Plan and Follow-up.  Plan: Telephone follow up appointment with care management team member scheduled for:  6 months  [name], PharmD, [facility] Clinical Pharmacist [facility] [place] [phone]

## 2021-12-25 ENCOUNTER — Ambulatory Visit (INDEPENDENT_AMBULATORY_CARE_PROVIDER_SITE_OTHER): Payer: PPO | Admitting: Pharmacist

## 2021-12-25 DIAGNOSIS — I10 Essential (primary) hypertension: Secondary | ICD-10-CM

## 2021-12-25 DIAGNOSIS — Z794 Long term (current) use of insulin: Secondary | ICD-10-CM

## 2021-12-26 NOTE — Patient Instructions (Signed)
Hi Billy Hale and Billy Hale,  It was great to catch up again! I am glad you are having a lot less lows for your blood sugars. Keep up the good work!  Please reach out to me if you have any questions or need anything before our follow up!  Best, Maddie  [name], PharmD, [facility] Clinical Pharmacist [facility] [place] [phone]   Visit Information   Goals Addressed   None    Patient Care Plan: CCM Pharmacy Care Plan     Problem Identified: Problem: Hypertension, Hyperlipidemia, Diabetes, GERD and Depression      Long-Range Goal: Patient-Specific Goal   Start Date: 06/27/2020  Expected End Date: 06/27/2021  Recent Progress: On track  Priority: High  Note:   Current Barriers:  Unable to independently afford treatment regimen Unable to independently monitor therapeutic efficacy  Pharmacist Clinical Goal(s):  Patient will verbalize ability to afford treatment regimen achieve adherence to monitoring guidelines and medication adherence to achieve therapeutic efficacy through collaboration with PharmD and provider.   Interventions: 1:1 collaboration with Shirline Frees, NP regarding development and update of comprehensive plan of care as evidenced by provider attestation and co-signature Inter-disciplinary care team collaboration (see longitudinal plan of care) Comprehensive medication review performed; medication list updated in electronic medical record  Hypertension (BP goal <130/80) -Controlled -Current treatment: Amlodipine 10mg , 1 tablet once daily - Appropriate, Effective, Safe, Accessible Valsartan-hydrochlorothiazide 160-25mg , 1 tablet once daily - Appropriate, Effective, Safe, Accessible  -Medications previously tried: benazepril, losartan  -Current home readings: does not check at home; needs to get a new BP cuff -Current dietary habits: did not discuss -Current exercise habits: did not discuss -Denies hypotensive/hypertensive symptoms -Educated on BP  goals and benefits of medications for prevention of heart attack, stroke and kidney damage; Importance of home blood pressure monitoring; Proper BP monitoring technique; -Counseled to monitor BP at home weekly, document, and provide log at future appointments -Counseled on diet and exercise extensively Recommended to continue current medication  Hyperlipidemia: (LDL goal < 70) -Controlled -Current treatment: Simvastatin 20mg , 1 tablet once daily - Appropriate, Effective, Safe, Accessible  -Medications previously tried: fenofibrate -Current dietary patterns: did not discuss -Current exercise habits: did not discuss -Educated on Cholesterol goals;  Benefits of statin for ASCVD risk reduction; Importance of limiting foods high in cholesterol; -Counseled on diet and exercise extensively Recommended to continue current medication  Diabetes (A1c goal <7%) -Controlled -Current medications: Ozempic 2 mg inject once weekly - Appropriate, Effective, Safe, Accessible Tresiba inject 106 units daily - Appropriate, Effective, Query Safe, Accessible Fiasp inject inject 22-25 units before meals - Appropriate, Effective, Query Safe, Accessible -Medications previously tried: n/a  -Current home glucose readings fasting glucose: 144 average over the last 14 days post prandial glucose: refer to CGM data -Reports hypoglycemic/hyperglycemic symptoms -Current meal patterns:  breakfast: did not discuss  lunch: did not discuss   dinner: did not discuss  snacks: did not discuss  drinks: did not discuss  -Current exercise: did not discuss -Educated on A1c and blood sugar goals; Prevention and management of hypoglycemic episodes; Carbohydrate counting and/or plate method -Counseled to check feet daily and get yearly eye exams -Counseled on diet and exercise extensively Recommended to continue current medication  Depression (Goal: minimize symptoms) -Controlled -Current treatment: Sertraline 100mg ,  1 tablet once daily - Appropriate, Effective, Safe, Accessible -Medications previously tried/failed: none -PHQ9: 0 -Educated on Benefits of medication for symptom control -Recommended to continue current medication  GERD (Goal: minimize symptoms) -Controlled -Current treatment  Omeprazole 20mg , 1 capsule once daily (patient reported not taking everyday - Appropriate, Effective, Safe, Accessible -Medications previously tried: none  -Recommended to continue current medication  Health Maintenance -Vaccine gaps: shingrix, COVID booster (declines), Prevnar, influenza (declines) -Current therapy:  No medications -Educated on Cost vs benefit of each product must be carefully weighed by individual consumer -Patient is satisfied with current therapy and denies issues -Recommended to continue as is  Patient Goals/Self-Care Activities Patient will:  - take medications as prescribed check glucose multiple times a day, document, and provide at future appointments check blood pressure weekly, document, and provide at future appointments target a minimum of 150 minutes of moderate intensity exercise weekly  Follow Up Plan: Telephone follow up appointment with care management team member scheduled for: 6 months       Patient verbalizes understanding of instructions and care plan provided today and agrees to view in MyChart. Active MyChart status and patient understanding of how to access instructions and care plan via MyChart confirmed with patient.    Telephone follow up appointment with pharmacy team member scheduled for: 6 months  , [facility]

## 2022-01-08 ENCOUNTER — Encounter: Payer: Self-pay | Admitting: Internal Medicine

## 2022-01-19 DIAGNOSIS — E1159 Type 2 diabetes mellitus with other circulatory complications: Secondary | ICD-10-CM

## 2022-01-19 DIAGNOSIS — I10 Essential (primary) hypertension: Secondary | ICD-10-CM

## 2022-01-19 DIAGNOSIS — F32A Depression, unspecified: Secondary | ICD-10-CM

## 2022-01-19 DIAGNOSIS — Z794 Long term (current) use of insulin: Secondary | ICD-10-CM

## 2022-01-19 DIAGNOSIS — E785 Hyperlipidemia, unspecified: Secondary | ICD-10-CM

## 2022-01-29 ENCOUNTER — Encounter: Payer: Self-pay | Admitting: Internal Medicine

## 2022-02-25 ENCOUNTER — Telehealth: Payer: Self-pay

## 2022-02-25 NOTE — Telephone Encounter (Signed)
Pt advised Patient Assistance Pen Needles (2 boxes) Tyler Aas (7 boxes) Fiasp (6 boxes) and Ozempic (4 boxes) have been delivered and ready for pick up.

## 2022-04-15 ENCOUNTER — Encounter: Payer: Self-pay | Admitting: Adult Health

## 2022-04-15 ENCOUNTER — Ambulatory Visit (INDEPENDENT_AMBULATORY_CARE_PROVIDER_SITE_OTHER): Payer: PPO | Admitting: Adult Health

## 2022-04-15 VITALS — BP 126/62 | HR 88 | Temp 98.3°F | Ht 68.0 in | Wt 297.8 lb

## 2022-04-15 DIAGNOSIS — Z794 Long term (current) use of insulin: Secondary | ICD-10-CM

## 2022-04-15 DIAGNOSIS — Z0000 Encounter for general adult medical examination without abnormal findings: Secondary | ICD-10-CM

## 2022-04-15 DIAGNOSIS — Z125 Encounter for screening for malignant neoplasm of prostate: Secondary | ICD-10-CM | POA: Diagnosis not present

## 2022-04-15 DIAGNOSIS — E1165 Type 2 diabetes mellitus with hyperglycemia: Secondary | ICD-10-CM | POA: Diagnosis not present

## 2022-04-15 DIAGNOSIS — K2100 Gastro-esophageal reflux disease with esophagitis, without bleeding: Secondary | ICD-10-CM

## 2022-04-15 DIAGNOSIS — E782 Mixed hyperlipidemia: Secondary | ICD-10-CM | POA: Diagnosis not present

## 2022-04-15 DIAGNOSIS — E669 Obesity, unspecified: Secondary | ICD-10-CM

## 2022-04-15 DIAGNOSIS — G4733 Obstructive sleep apnea (adult) (pediatric): Secondary | ICD-10-CM

## 2022-04-15 DIAGNOSIS — F339 Major depressive disorder, recurrent, unspecified: Secondary | ICD-10-CM | POA: Diagnosis not present

## 2022-04-15 DIAGNOSIS — I10 Essential (primary) hypertension: Secondary | ICD-10-CM

## 2022-04-15 LAB — COMPREHENSIVE METABOLIC PANEL
ALT: 32 U/L (ref 0–53)
AST: 29 U/L (ref 0–37)
Albumin: 4 g/dL (ref 3.5–5.2)
Alkaline Phosphatase: 122 U/L — ABNORMAL HIGH (ref 39–117)
BUN: 13 mg/dL (ref 6–23)
CO2: 27 mEq/L (ref 19–32)
Calcium: 9.9 mg/dL (ref 8.4–10.5)
Chloride: 102 mEq/L (ref 96–112)
Creatinine, Ser: 0.77 mg/dL (ref 0.40–1.50)
GFR: 89.46 mL/min (ref >60.00)
Glucose, Bld: 142 mg/dL — ABNORMAL HIGH (ref 70–99)
Potassium: 3.8 mEq/L (ref 3.5–5.1)
Sodium: 140 mEq/L (ref 135–145)
Total Bilirubin: 0.5 mg/dL (ref 0.2–1.2)
Total Protein: 7.6 g/dL (ref 6.0–8.3)

## 2022-04-15 LAB — MICROALBUMIN / CREATININE URINE RATIO
Creatinine,U: 102.4 mg/dL
Microalb Creat Ratio: 26 mg/g (ref 0.0–30.0)
Microalb, Ur: 26.6 mg/dL — ABNORMAL HIGH (ref 0.0–1.9)

## 2022-04-15 LAB — CBC
HCT: 38.7 % — ABNORMAL LOW (ref 39.0–52.0)
Hemoglobin: 13.6 g/dL (ref 13.0–17.0)
MCHC: 35 g/dL (ref 30.0–36.0)
MCV: 98.7 fl (ref 78.0–100.0)
Platelets: 84 10*3/uL — ABNORMAL LOW (ref 150.0–400.0)
RBC: 3.92 Mil/uL — ABNORMAL LOW (ref 4.22–5.81)
RDW: 14.3 % (ref 11.5–15.5)
WBC: 4.3 10*3/uL (ref 4.0–10.5)

## 2022-04-15 LAB — LIPID PANEL
Cholesterol: 115 mg/dL (ref 0–200)
HDL: 46.2 mg/dL (ref >39.00)
LDL Cholesterol: 52 mg/dL (ref 0–99)
NonHDL: 69.04
Total CHOL/HDL Ratio: 2
Triglycerides: 86 mg/dL (ref 0.0–149.0)
VLDL: 17.2 mg/dL (ref 0.0–40.0)

## 2022-04-15 LAB — TSH: TSH: 2.52 u[IU]/mL (ref 0.35–5.50)

## 2022-04-15 LAB — HEMOGLOBIN A1C: Hgb A1c MFr Bld: 5.8 % (ref 4.6–6.5)

## 2022-04-15 MED ORDER — OMEPRAZOLE 20 MG PO CPDR: DELAYED_RELEASE_CAPSULE | ORAL | 3 refills | Status: DC
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
## 2022-04-15 NOTE — Addendum Note (Signed)
Addended by: Colin Rhein A on: 04/15/2022 11:06 AM   Modules accepted: Orders

## 2022-04-15 NOTE — Patient Instructions (Signed)
It was great seeing you today   We will follow up with you regarding your lab work   Please let me know if you need anything   Someone from pulmonary will call you to get you back to see Dr. Elsworth Soho

## 2022-04-15 NOTE — Addendum Note (Signed)
Addended by: Rosalyn Gess D on: 04/15/2022 10:58 AM   Modules accepted: Orders

## 2022-04-16 ENCOUNTER — Encounter: Payer: Self-pay | Admitting: Adult Health

## 2022-04-17 ENCOUNTER — Ambulatory Visit: Payer: PPO | Admitting: Internal Medicine

## 2022-04-17 ENCOUNTER — Encounter: Payer: Self-pay | Admitting: Internal Medicine

## 2022-04-17 VITALS — BP 124/86 | HR 96 | Ht 63.0 in | Wt 300.0 lb

## 2022-04-17 DIAGNOSIS — Z794 Long term (current) use of insulin: Secondary | ICD-10-CM | POA: Diagnosis not present

## 2022-04-17 DIAGNOSIS — E782 Mixed hyperlipidemia: Secondary | ICD-10-CM | POA: Diagnosis not present

## 2022-04-17 DIAGNOSIS — E669 Obesity, unspecified: Secondary | ICD-10-CM | POA: Diagnosis not present

## 2022-04-17 DIAGNOSIS — E038 Other specified hypothyroidism: Secondary | ICD-10-CM

## 2022-04-17 DIAGNOSIS — E1165 Type 2 diabetes mellitus with hyperglycemia: Secondary | ICD-10-CM | POA: Diagnosis not present

## 2022-04-17 NOTE — Patient Instructions (Addendum)
Please continue: - Metformin 1000 mg 2x a day - Ozempic 1 mg weekly - Tresiba 80 units daily - FiAsp: - 20-23 units before b'fast - 20-23 units before dinner  Please return in 4 months.
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
## 2022-04-21 ENCOUNTER — Other Ambulatory Visit: Payer: Self-pay | Admitting: Internal Medicine

## 2022-04-21 DIAGNOSIS — E1165 Type 2 diabetes mellitus with hyperglycemia: Secondary | ICD-10-CM

## 2022-04-28 ENCOUNTER — Other Ambulatory Visit: Payer: Self-pay | Admitting: Internal Medicine

## 2022-05-19 ENCOUNTER — Ambulatory Visit (INDEPENDENT_AMBULATORY_CARE_PROVIDER_SITE_OTHER): Payer: PPO | Admitting: Pulmonary Disease

## 2022-05-19 ENCOUNTER — Encounter (HOSPITAL_BASED_OUTPATIENT_CLINIC_OR_DEPARTMENT_OTHER): Payer: Self-pay | Admitting: Pulmonary Disease

## 2022-05-19 VITALS — BP 122/60 | HR 96 | Temp 97.9°F | Ht 69.0 in | Wt 297.0 lb

## 2022-05-19 DIAGNOSIS — G4733 Obstructive sleep apnea (adult) (pediatric): Secondary | ICD-10-CM | POA: Diagnosis not present
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
## 2022-05-19 NOTE — Assessment & Plan Note (Signed)
Replacement auto CPAP will be provided 12 to 18 cm. I suggested trial of AirFit F30 fullface mask. He is very compliant and CPAP is only helped improve his daytime somnolence and fatigue.  His previous study showed mild OSA in 2015 but I believe this is an underestimate he clearly is very symptomatic, I do not feel repeat study is necessary here Weight loss encouraged, compliance with goal of at least 4-6 hrs every night is the expectation. Advised against medications with sedative side effects Cautioned against driving when sleepy - understanding that sleepiness will vary on a day to day basis

## 2022-05-19 NOTE — Patient Instructions (Signed)
  X Rx for auto CPAP 12- 18 cm to DME

## 2022-06-03 ENCOUNTER — Telehealth: Payer: Self-pay

## 2022-06-03 NOTE — Telephone Encounter (Signed)
Log noted patient picked up:  Pen Needles 2 boxes [place] 7 boxes Fiasp 6 boxes Ozempic 4 boxes

## 2022-06-03 NOTE — Telephone Encounter (Signed)
Pt advised patient assistance delivered and ready for pick up.  Pen Needles 2 boxes [place] 7 boxes Fiasp 6 boxes Ozempic 4 boxes

## 2022-06-12 ENCOUNTER — Telehealth (HOSPITAL_BASED_OUTPATIENT_CLINIC_OR_DEPARTMENT_OTHER): Payer: Self-pay | Admitting: Pulmonary Disease

## 2022-06-12 NOTE — Telephone Encounter (Signed)
Patients wife states [employer] told her they have not received the order for her husband. Told her that the order has been sent, but [employer] is stating they have not received the order from [place]. Please advise.

## 2022-06-13 NOTE — Telephone Encounter (Signed)
[place], Loreli Dollar, Raven Lennie Odor, [place]; [place], Tomie China; Kathe Becton Hello Raven,  The order was received and sent to intake on 05-22-22 1124am.  Its in process at this time.  Thank you,  Luellen Pucker

## 2022-07-09 DIAGNOSIS — G4733 Obstructive sleep apnea (adult) (pediatric): Secondary | ICD-10-CM | POA: Diagnosis not present

## 2022-08-05 ENCOUNTER — Encounter: Payer: Self-pay | Admitting: Internal Medicine

## 2022-08-05 ENCOUNTER — Ambulatory Visit: Payer: PPO | Admitting: Internal Medicine

## 2022-08-05 VITALS — BP 122/60 | HR 97 | Ht 69.0 in | Wt 293.8 lb

## 2022-08-05 DIAGNOSIS — E782 Mixed hyperlipidemia: Secondary | ICD-10-CM

## 2022-08-05 DIAGNOSIS — E1165 Type 2 diabetes mellitus with hyperglycemia: Secondary | ICD-10-CM | POA: Diagnosis not present

## 2022-08-05 DIAGNOSIS — E038 Other specified hypothyroidism: Secondary | ICD-10-CM | POA: Diagnosis not present

## 2022-08-05 DIAGNOSIS — Z7984 Long term (current) use of oral hypoglycemic drugs: Secondary | ICD-10-CM

## 2022-08-05 DIAGNOSIS — E119 Type 2 diabetes mellitus without complications: Secondary | ICD-10-CM | POA: Diagnosis not present

## 2022-08-05 DIAGNOSIS — Z7985 Long-term (current) use of injectable non-insulin antidiabetic drugs: Secondary | ICD-10-CM

## 2022-08-05 DIAGNOSIS — Z794 Long term (current) use of insulin: Secondary | ICD-10-CM

## 2022-08-05 LAB — HEMOGLOBIN A1C: Hemoglobin A1C: 5.3

## 2022-08-05 MED ORDER — TRESIBA FLEXTOUCH 200 UNIT/ML ~~LOC~~ SOPN: 80.0000 [IU] | PEN_INJECTOR | Freq: Every day | SUBCUTANEOUS | 3 refills | Status: DC

## 2022-08-05 NOTE — Patient Instructions (Addendum)
Please continue: - Metformin 1000 mg 2x a day - Ozempic 1 mg weekly - FiAsp: - 20-23 units before b'fast - 20-23 units before dinner  Please reduce: - Tresiba 78 units daily (if you still have lows after this, may need to decrease the dose further, by 5-10 more units)  Please return in 4 months.
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
## 2022-08-08 DIAGNOSIS — G4733 Obstructive sleep apnea (adult) (pediatric): Secondary | ICD-10-CM | POA: Diagnosis not present

## 2022-09-08 DIAGNOSIS — G4733 Obstructive sleep apnea (adult) (pediatric): Secondary | ICD-10-CM | POA: Diagnosis not present

## 2022-09-11 ENCOUNTER — Telehealth: Payer: Self-pay

## 2022-09-11 NOTE — Telephone Encounter (Signed)
I spoke to Billy Hale wife and she is aware the we have Billy Hale 32G tip Needles as well as his Billy Hale & Ozempic ready for pick up

## 2022-10-06 ENCOUNTER — Encounter: Payer: Self-pay | Admitting: Internal Medicine

## 2022-10-09 DIAGNOSIS — G4733 Obstructive sleep apnea (adult) (pediatric): Secondary | ICD-10-CM | POA: Diagnosis not present

## 2022-10-14 LAB — HM DIABETES EYE EXAM

## 2022-11-03 ENCOUNTER — Encounter: Payer: Self-pay | Admitting: Adult Health

## 2022-11-03 ENCOUNTER — Other Ambulatory Visit: Payer: Self-pay | Admitting: Adult Health

## 2022-11-04 ENCOUNTER — Other Ambulatory Visit (HOSPITAL_COMMUNITY): Payer: Self-pay

## 2022-11-04 ENCOUNTER — Other Ambulatory Visit: Payer: Self-pay | Admitting: Adult Health

## 2022-11-04 ENCOUNTER — Other Ambulatory Visit: Payer: Self-pay

## 2022-11-04 DIAGNOSIS — F339 Major depressive disorder, recurrent, unspecified: Secondary | ICD-10-CM

## 2022-11-04 DIAGNOSIS — I10 Essential (primary) hypertension: Secondary | ICD-10-CM

## 2022-11-04 MED ORDER — SIMVASTATIN 20 MG PO TABS: 20.0000 mg | ORAL_TABLET | Freq: Every evening | ORAL | 3 refills | Status: DC

## 2022-11-04 MED ORDER — VALSARTAN-HYDROCHLOROTHIAZIDE 160-25 MG PO TABS: 1.0000 | ORAL_TABLET | Freq: Every morning | ORAL | 3 refills | Status: DC

## 2022-11-04 MED ORDER — FEXOFENADINE HCL 180 MG PO TABS
180.0000 mg | ORAL_TABLET | Freq: Every day | ORAL | 3 refills | Status: DC
  Filled 2022-11-04: qty 90, 90d supply, fill #0

## 2022-11-04 MED ORDER — SERTRALINE HCL 100 MG PO TABS
100.0000 mg | ORAL_TABLET | Freq: Every morning | ORAL | 3 refills | Status: DC
Start: 2022-11-04 — End: 2023-10-29

## 2022-11-04 MED ORDER — AMLODIPINE BESYLATE 10 MG PO TABS
10.0000 mg | ORAL_TABLET | Freq: Every morning | ORAL | 3 refills | Status: DC
Start: 2022-11-04 — End: 2023-10-29

## 2022-11-05 ENCOUNTER — Encounter (HOSPITAL_COMMUNITY): Payer: Self-pay | Admitting: Pharmacist

## 2022-11-05 ENCOUNTER — Other Ambulatory Visit (HOSPITAL_COMMUNITY): Payer: Self-pay

## 2022-11-08 DIAGNOSIS — G4733 Obstructive sleep apnea (adult) (pediatric): Secondary | ICD-10-CM | POA: Diagnosis not present

## 2022-11-10 ENCOUNTER — Other Ambulatory Visit: Payer: Self-pay

## 2022-12-09 ENCOUNTER — Encounter: Payer: Self-pay | Admitting: Internal Medicine

## 2022-12-09 ENCOUNTER — Encounter: Payer: Self-pay | Admitting: Adult Health

## 2022-12-09 ENCOUNTER — Ambulatory Visit: Payer: PPO | Admitting: Internal Medicine

## 2022-12-09 VITALS — BP 138/80 | HR 88 | Ht 69.0 in | Wt 301.4 lb

## 2022-12-09 DIAGNOSIS — E782 Mixed hyperlipidemia: Secondary | ICD-10-CM | POA: Diagnosis not present

## 2022-12-09 DIAGNOSIS — Z794 Long term (current) use of insulin: Secondary | ICD-10-CM

## 2022-12-09 DIAGNOSIS — E038 Other specified hypothyroidism: Secondary | ICD-10-CM

## 2022-12-09 DIAGNOSIS — E1165 Type 2 diabetes mellitus with hyperglycemia: Secondary | ICD-10-CM

## 2022-12-09 DIAGNOSIS — G4733 Obstructive sleep apnea (adult) (pediatric): Secondary | ICD-10-CM | POA: Diagnosis not present

## 2022-12-09 LAB — POCT GLYCOSYLATED HEMOGLOBIN (HGB A1C): Hemoglobin A1C: 5.5 % (ref 4.0–5.6)

## 2022-12-09 MED ORDER — METFORMIN HCL 1000 MG PO TABS: 2000.0000 mg | ORAL_TABLET | Freq: Every morning | ORAL | 3 refills | Status: DC

## 2022-12-09 MED ORDER — TRESIBA FLEXTOUCH 200 UNIT/ML ~~LOC~~ SOPN: 70.0000 [IU] | PEN_INJECTOR | Freq: Every day | SUBCUTANEOUS | Status: AC

## 2022-12-09 MED ORDER — FIASP FLEXTOUCH 100 UNIT/ML ~~LOC~~ SOPN: PEN_INJECTOR | SUBCUTANEOUS | Status: AC

## 2022-12-09 NOTE — Addendum Note (Signed)
Addended by: Pollie Meyer on: 12/09/2022 02:29 PM   Modules accepted: Orders
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
## 2022-12-09 NOTE — Patient Instructions (Addendum)
Please continue: - Metformin 1000 mg 2x a day  Try to alternate: - Ozempic 1 mg weekly  with Trulicity 3 mg weekly  Decrease: - FiAsp: - 20-25 units before b'fast - 20-25 units before dinner - Tresiba 70 units daily  Please return in 4 months.

## 2022-12-10 ENCOUNTER — Encounter: Payer: Self-pay | Admitting: Adult Health

## 2022-12-10 ENCOUNTER — Encounter: Payer: Self-pay | Admitting: Internal Medicine

## 2022-12-10 NOTE — Telephone Encounter (Signed)
Eye exam abstracted and placed in scans.

## 2023-01-08 DIAGNOSIS — G4733 Obstructive sleep apnea (adult) (pediatric): Secondary | ICD-10-CM | POA: Diagnosis not present

## 2023-01-23 ENCOUNTER — Telehealth: Payer: Self-pay

## 2023-01-23 NOTE — Telephone Encounter (Signed)
 Patient spouse advise that patient has pen needles that are ready for pick up

## 2023-02-03 ENCOUNTER — Ambulatory Visit: Payer: PPO | Admitting: Internal Medicine

## 2023-02-04 ENCOUNTER — Telehealth: Payer: Self-pay | Admitting: Internal Medicine

## 2023-02-04 NOTE — Telephone Encounter (Signed)
 Patient dropped off patient assistance paperwork.  This was placed in the providers in box at the front desk.  Company is [employer]

## 2023-02-08 DIAGNOSIS — G4733 Obstructive sleep apnea (adult) (pediatric): Secondary | ICD-10-CM | POA: Diagnosis not present

## 2023-02-18 NOTE — Telephone Encounter (Signed)
This has already been faxed

## 2023-03-11 DIAGNOSIS — G4733 Obstructive sleep apnea (adult) (pediatric): Secondary | ICD-10-CM | POA: Diagnosis not present

## 2023-04-08 DIAGNOSIS — G4733 Obstructive sleep apnea (adult) (pediatric): Secondary | ICD-10-CM | POA: Diagnosis not present

## 2023-04-09 ENCOUNTER — Ambulatory Visit: Payer: PPO | Admitting: Internal Medicine

## 2023-04-09 ENCOUNTER — Encounter: Payer: Self-pay | Admitting: Internal Medicine

## 2023-04-09 ENCOUNTER — Telehealth: Payer: Self-pay

## 2023-04-09 VITALS — BP 124/70 | HR 83 | Ht 69.0 in | Wt 309.0 lb

## 2023-04-09 DIAGNOSIS — E1165 Type 2 diabetes mellitus with hyperglycemia: Secondary | ICD-10-CM | POA: Diagnosis not present

## 2023-04-09 DIAGNOSIS — Z794 Long term (current) use of insulin: Secondary | ICD-10-CM

## 2023-04-09 DIAGNOSIS — E038 Other specified hypothyroidism: Secondary | ICD-10-CM

## 2023-04-09 DIAGNOSIS — E782 Mixed hyperlipidemia: Secondary | ICD-10-CM

## 2023-04-09 MED ORDER — FREESTYLE LIBRE 3 PLUS SENSOR MISC: 1.0000 | 3 refills | Status: AC
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
## 2023-04-09 NOTE — Patient Instructions (Addendum)
 Please continue: - Metformin 1000 mg 2x a day - Ozempic 1 mg weekly  - Tresiba 70 units daily - FiAsp: 20-30 units 5 min before meals only (do not take this unless you plan to eat a meal)  Include some carbs with every meal. Limit eggs to only 1 at a time.  Please call Dr. Kirstie Peri office.  Please return in 4 months.

## 2023-04-09 NOTE — Telephone Encounter (Signed)
Requested Prescriptions   Signed Prescriptions Disp Refills   Continuous Glucose Sensor (FREESTYLE LIBRE 3 PLUS SENSOR) MISC 6 each 3    Sig: Inject 1 Device into the skin continuous. Change every 15 days    Authorizing Provider: [name]    Ordering User: Pollie Meyer

## 2023-04-10 ENCOUNTER — Encounter: Payer: Self-pay | Admitting: Internal Medicine

## 2023-04-10 ENCOUNTER — Encounter: Payer: Self-pay | Admitting: Adult Health

## 2023-04-10 LAB — LIPID PANEL W/REFLEX DIRECT LDL
Cholesterol: 122 mg/dL (ref <200)
HDL: 48 mg/dL (ref >=40)
LDL Cholesterol (Calc): 58 mg/dL
Non-HDL Cholesterol (Calc): 74 mg/dL (ref <130)
Total CHOL/HDL Ratio: 2.5 (calc) (ref <5.0)
Triglycerides: 84 mg/dL (ref <150)

## 2023-04-10 LAB — COMPLETE METABOLIC PANEL WITH GFR
AG Ratio: 1.2 (calc) (ref 1.0–2.5)
ALT: 26 U/L (ref 9–46)
AST: 32 U/L (ref 10–35)
Albumin: 3.9 g/dL (ref 3.6–5.1)
Alkaline phosphatase (APISO): 88 U/L (ref 35–144)
BUN: 12 mg/dL (ref 7–25)
CO2: 24 mmol/L (ref 20–32)
Calcium: 9.1 mg/dL (ref 8.6–10.3)
Chloride: 105 mmol/L (ref 98–110)
Creat: 0.8 mg/dL (ref 0.70–1.28)
Globulin: 3.3 g/dL (ref 1.9–3.7)
Glucose, Bld: 87 mg/dL (ref 65–99)
Potassium: 3.7 mmol/L (ref 3.5–5.3)
Sodium: 139 mmol/L (ref 135–146)
Total Bilirubin: 0.8 mg/dL (ref 0.2–1.2)
Total Protein: 7.2 g/dL (ref 6.1–8.1)

## 2023-04-10 LAB — MICROALBUMIN / CREATININE URINE RATIO
Creatinine, Urine: 88 mg/dL (ref 20–320)
Microalb Creat Ratio: 192 mg/g{creat} — ABNORMAL HIGH (ref <30)
Microalb, Ur: 16.9 mg/dL

## 2023-04-10 LAB — TSH: TSH: 1.98 m[IU]/L (ref 0.40–4.50)

## 2023-04-23 ENCOUNTER — Telehealth: Payer: Self-pay

## 2023-04-23 NOTE — Telephone Encounter (Signed)
 Additional Patient assistance   Fiasp U100 4 boxes  Ozempic 1 mg 4 boxes

## 2023-04-23 NOTE — Telephone Encounter (Signed)
 Patient Assistance  Medication:Tresiba  Dosage:U200 Quantity:5 boxes

## 2023-04-24 NOTE — Telephone Encounter (Signed)
 Patient picked up patient assistance - Log Noted  Lanae Boast, & Evaristo Bury

## 2023-05-09 DIAGNOSIS — G4733 Obstructive sleep apnea (adult) (pediatric): Secondary | ICD-10-CM | POA: Diagnosis not present

## 2023-06-08 DIAGNOSIS — G4733 Obstructive sleep apnea (adult) (pediatric): Secondary | ICD-10-CM | POA: Diagnosis not present

## 2023-07-09 DIAGNOSIS — G4733 Obstructive sleep apnea (adult) (pediatric): Secondary | ICD-10-CM | POA: Diagnosis not present

## 2023-08-13 ENCOUNTER — Telehealth: Payer: Self-pay

## 2023-08-13 NOTE — Telephone Encounter (Signed)
 Patient Assistance  Medication: Ozempic  Dosage:1 mg  Medication: Tresiba   Dosage: U200  Medication: Fiasp   Dosage: U100

## 2023-08-17 ENCOUNTER — Encounter: Payer: Self-pay | Admitting: Internal Medicine

## 2023-08-20 ENCOUNTER — Ambulatory Visit: Admitting: Internal Medicine

## 2023-08-24 NOTE — Telephone Encounter (Signed)
 Patient came this morning 08/24/23 and picked up his patient assistance.

## 2023-09-02 ENCOUNTER — Encounter: Payer: Self-pay | Admitting: Internal Medicine

## 2023-09-02 ENCOUNTER — Ambulatory Visit: Admitting: Internal Medicine

## 2023-09-02 VITALS — BP 122/70 | HR 74 | Ht 69.0 in | Wt 303.8 lb

## 2023-09-02 DIAGNOSIS — E782 Mixed hyperlipidemia: Secondary | ICD-10-CM | POA: Diagnosis not present

## 2023-09-02 DIAGNOSIS — Z794 Long term (current) use of insulin: Secondary | ICD-10-CM | POA: Diagnosis not present

## 2023-09-02 DIAGNOSIS — E1165 Type 2 diabetes mellitus with hyperglycemia: Secondary | ICD-10-CM | POA: Diagnosis not present

## 2023-09-02 DIAGNOSIS — E038 Other specified hypothyroidism: Secondary | ICD-10-CM

## 2023-09-02 NOTE — Patient Instructions (Addendum)
 Please continue: - Metformin  1000 mg 2x a day - Ozempic  1 mg weekly  - Tresiba  70 units daily  Inject in upper abdomen or thighs: - FiAsp : 20-25 units 10 min before meals   Please return in 3-4 months.
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
## 2023-09-03 ENCOUNTER — Other Ambulatory Visit: Payer: Self-pay | Admitting: Internal Medicine

## 2023-09-03 ENCOUNTER — Ambulatory Visit: Payer: Self-pay | Admitting: Internal Medicine

## 2023-09-03 LAB — MICROALBUMIN / CREATININE URINE RATIO
Creatinine, Urine: 77 mg/dL (ref 20–320)
Microalb Creat Ratio: 201 mg/g{creat} — ABNORMAL HIGH (ref <30)
Microalb, Ur: 15.5 mg/dL

## 2023-09-03 MED ORDER — DAPAGLIFLOZIN PROPANEDIOL 10 MG PO TABS: 10.0000 mg | ORAL_TABLET | Freq: Every day | ORAL | 1 refills | Status: DC

## 2023-10-18 ENCOUNTER — Other Ambulatory Visit: Payer: Self-pay | Admitting: Adult Health

## 2023-10-18 DIAGNOSIS — F339 Major depressive disorder, recurrent, unspecified: Secondary | ICD-10-CM

## 2023-10-18 DIAGNOSIS — I10 Essential (primary) hypertension: Secondary | ICD-10-CM

## 2023-10-20 ENCOUNTER — Other Ambulatory Visit: Payer: Self-pay | Admitting: Adult Health

## 2023-10-20 DIAGNOSIS — F339 Major depressive disorder, recurrent, unspecified: Secondary | ICD-10-CM

## 2023-10-20 DIAGNOSIS — I10 Essential (primary) hypertension: Secondary | ICD-10-CM

## 2023-10-21 NOTE — Telephone Encounter (Signed)
 Called pt but it goes to vm.

## 2023-10-21 NOTE — Telephone Encounter (Signed)
 Patient need to schedule CPE for more refills.

## 2023-10-29 ENCOUNTER — Other Ambulatory Visit: Payer: Self-pay | Admitting: Adult Health

## 2023-10-29 DIAGNOSIS — I10 Essential (primary) hypertension: Secondary | ICD-10-CM

## 2023-10-29 DIAGNOSIS — F339 Major depressive disorder, recurrent, unspecified: Secondary | ICD-10-CM

## 2023-10-29 NOTE — Telephone Encounter (Unsigned)
 Copied from CRM [id number]. Topic: Clinical - Prescription Issue >> Oct 29, 2023  3:01 PM Mia F wrote: Reason for CRM: Pt requested   amLODipine  (NORVASC ) 10 MG tablet  sertraline  (ZOLOFT ) 100 MG tablet  simvastatin  (ZOCOR ) 20 MG tablet  They were denied stating pt needs an appt. Pt scheduled a follow up for 12/16. His last office note says to return around 12/13. Pt would like to know if he can now get his scripts. He is out of meds soon

## 2023-10-30 MED ORDER — SIMVASTATIN 20 MG PO TABS: 20.0000 mg | ORAL_TABLET | Freq: Every evening | ORAL | 3 refills | Status: AC

## 2023-10-30 MED ORDER — SERTRALINE HCL 100 MG PO TABS: 100.0000 mg | ORAL_TABLET | Freq: Every morning | ORAL | 3 refills | Status: AC

## 2023-10-30 MED ORDER — AMLODIPINE BESYLATE 10 MG PO TABS: 10.0000 mg | ORAL_TABLET | Freq: Every morning | ORAL | 3 refills | Status: AC

## 2023-10-31 ENCOUNTER — Other Ambulatory Visit: Payer: Self-pay | Admitting: Internal Medicine

## 2023-11-04 ENCOUNTER — Other Ambulatory Visit: Payer: Self-pay | Admitting: Adult Health

## 2023-11-04 DIAGNOSIS — I10 Essential (primary) hypertension: Secondary | ICD-10-CM

## 2023-11-09 ENCOUNTER — Telehealth: Payer: Self-pay

## 2023-11-09 NOTE — Telephone Encounter (Signed)
 Patient Assistance  Medication: Ozempic   Dosage:1 mg  Quantity:4  Medication:Fiasp   Dosage:U100 Quantity:4   Medication:Tresiba   Dosage:U200 Quantity:5  Rolin KRAFT

## 2023-11-11 NOTE — Telephone Encounter (Signed)
 Patient picked up patient assistance - Log Noted Ozempic ,Tresiba  and Fiasp 

## 2023-11-11 NOTE — Telephone Encounter (Signed)
 Patient picked up patient assistance - Log Noted Ozempic ,Tresiba  and Fiasp  on 11/11/23

## 2023-12-31 ENCOUNTER — Encounter: Payer: Self-pay | Admitting: Internal Medicine

## 2023-12-31 ENCOUNTER — Ambulatory Visit: Admitting: Internal Medicine

## 2023-12-31 VITALS — BP 120/70 | HR 80 | Ht 69.0 in | Wt 308.6 lb

## 2023-12-31 DIAGNOSIS — E782 Mixed hyperlipidemia: Secondary | ICD-10-CM | POA: Diagnosis not present

## 2023-12-31 DIAGNOSIS — E1165 Type 2 diabetes mellitus with hyperglycemia: Secondary | ICD-10-CM | POA: Diagnosis not present

## 2023-12-31 DIAGNOSIS — Z794 Long term (current) use of insulin: Secondary | ICD-10-CM

## 2023-12-31 DIAGNOSIS — E038 Other specified hypothyroidism: Secondary | ICD-10-CM

## 2023-12-31 LAB — POCT GLYCOSYLATED HEMOGLOBIN (HGB A1C): Hemoglobin A1C: 6.2 % — AB (ref 4.0–5.6)

## 2023-12-31 MED ORDER — EMPAGLIFLOZIN 25 MG PO TABS: 25.0000 mg | ORAL_TABLET | Freq: Every day | ORAL | 3 refills | Status: AC
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
## 2023-12-31 NOTE — Patient Instructions (Addendum)
 Please continue: - Metformin  2000 mg daily - Ozempic  1 mg weekly  - Tresiba  70 units daily - FiAsp : 20-27 units 10 min before meals -inject in abdomen or thighs  Try to start: - Jardiance  25 mg before b'fast  STOP MILK!  Please return in 3-4 months.

## 2024-01-05 ENCOUNTER — Encounter: Payer: Self-pay | Admitting: Adult Health

## 2024-01-05 ENCOUNTER — Ambulatory Visit: Admitting: Adult Health

## 2024-01-05 VITALS — BP 120/70 | HR 94 | Temp 98.0°F | Ht 69.0 in | Wt 314.0 lb

## 2024-01-05 DIAGNOSIS — Z7984 Long term (current) use of oral hypoglycemic drugs: Secondary | ICD-10-CM | POA: Diagnosis not present

## 2024-01-05 DIAGNOSIS — G479 Sleep disorder, unspecified: Secondary | ICD-10-CM

## 2024-01-05 DIAGNOSIS — G4733 Obstructive sleep apnea (adult) (pediatric): Secondary | ICD-10-CM

## 2024-01-05 DIAGNOSIS — E119 Type 2 diabetes mellitus without complications: Secondary | ICD-10-CM | POA: Diagnosis not present

## 2024-01-05 DIAGNOSIS — F339 Major depressive disorder, recurrent, unspecified: Secondary | ICD-10-CM | POA: Diagnosis not present

## 2024-01-05 DIAGNOSIS — Z0000 Encounter for general adult medical examination without abnormal findings: Secondary | ICD-10-CM

## 2024-01-05 DIAGNOSIS — N529 Male erectile dysfunction, unspecified: Secondary | ICD-10-CM

## 2024-01-05 DIAGNOSIS — Z125 Encounter for screening for malignant neoplasm of prostate: Secondary | ICD-10-CM

## 2024-01-05 DIAGNOSIS — J310 Chronic rhinitis: Secondary | ICD-10-CM

## 2024-01-05 DIAGNOSIS — E782 Mixed hyperlipidemia: Secondary | ICD-10-CM

## 2024-01-05 DIAGNOSIS — I10 Essential (primary) hypertension: Secondary | ICD-10-CM

## 2024-01-05 DIAGNOSIS — Z794 Long term (current) use of insulin: Secondary | ICD-10-CM

## 2024-01-05 DIAGNOSIS — Z1211 Encounter for screening for malignant neoplasm of colon: Secondary | ICD-10-CM

## 2024-01-05 LAB — COMPREHENSIVE METABOLIC PANEL WITH GFR
ALT: 27 U/L (ref 3–53)
AST: 37 U/L (ref 5–37)
Albumin: 3.7 g/dL (ref 3.5–5.2)
Alkaline Phosphatase: 139 U/L — ABNORMAL HIGH (ref 39–117)
BUN: 12 mg/dL (ref 6–23)
CO2: 25 meq/L (ref 19–32)
Calcium: 8.9 mg/dL (ref 8.4–10.5)
Chloride: 105 meq/L (ref 96–112)
Creatinine, Ser: 0.68 mg/dL (ref 0.40–1.50)
GFR: 91.77 mL/min (ref >60.00)
Glucose, Bld: 142 mg/dL — ABNORMAL HIGH (ref 70–99)
Potassium: 3.7 meq/L (ref 3.5–5.1)
Sodium: 138 meq/L (ref 135–145)
Total Bilirubin: 0.9 mg/dL (ref 0.2–1.2)
Total Protein: 7.2 g/dL (ref 6.0–8.3)

## 2024-01-05 LAB — CBC
HCT: 38.2 % — ABNORMAL LOW (ref 39.0–52.0)
Hemoglobin: 13.3 g/dL (ref 13.0–17.0)
MCHC: 34.8 g/dL (ref 30.0–36.0)
MCV: 99.5 fl (ref 78.0–100.0)
Platelets: 69 K/uL — ABNORMAL LOW (ref 150.0–400.0)
RBC: 3.83 Mil/uL — ABNORMAL LOW (ref 4.22–5.81)
RDW: 14.7 % (ref 11.5–15.5)
WBC: 3.8 K/uL — ABNORMAL LOW (ref 4.0–10.5)

## 2024-01-05 LAB — PSA: PSA: 1.28 ng/mL (ref 0.10–4.00)

## 2024-01-05 LAB — LIPID PANEL
Cholesterol: 102 mg/dL (ref 28–200)
HDL: 42.7 mg/dL (ref >39.00)
LDL Cholesterol: 47 mg/dL (ref 10–99)
NonHDL: 59.56
Total CHOL/HDL Ratio: 2
Triglycerides: 61 mg/dL (ref 10.0–149.0)
VLDL: 12.2 mg/dL (ref 0.0–40.0)

## 2024-01-05 LAB — TSH: TSH: 2.08 u[IU]/mL (ref 0.35–5.50)

## 2024-01-05 MED ORDER — SILDENAFIL CITRATE 100 MG PO TABS: 50.0000 mg | ORAL_TABLET | Freq: Every day | ORAL | 11 refills | Status: AC | PRN

## 2024-01-05 MED ORDER — AZELASTINE HCL 0.1 % NA SOLN: 2.0000 | Freq: Two times a day (BID) | NASAL | 3 refills | Status: AC

## 2024-01-05 NOTE — Progress Notes (Signed)
° °  01/05/2024  Patient ID: Billy Hale, male   DOB: Jun 06, 1950, 73 y.o.   MRN: [id number]  Pharmacy Quality Measure Review  This patient is appearing on a report for being at risk of failing the adherence measure for hypertension (ACEi/ARB) medications this calendar year.   Medication: Valsartan -HCTZ Last fill date: 07/23/23 for 90 day supply  Will collaborate with provider to facilitate refill needs.  Jon VEAR Lindau, PharmD Clinical Pharmacist [phone]

## 2024-01-05 NOTE — Addendum Note (Signed)
 Addended by: MERNA DARLEENE CROME on: 01/05/2024 12:14 PM   Modules accepted: Level of Service

## 2024-01-05 NOTE — Patient Instructions (Addendum)
 It was great seeing you today   We will follow up with you regarding your lab work   Please let me know if you need anything   Try melatonin 5 mg nightly for 2 weeks   I am going to send in a nasal spray for you but also use Normal Saline Nasal Spray
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
## 2024-01-06 ENCOUNTER — Ambulatory Visit: Payer: Self-pay | Admitting: Adult Health

## 2024-01-07 ENCOUNTER — Other Ambulatory Visit: Payer: Self-pay | Admitting: Adult Health

## 2024-01-07 DIAGNOSIS — I10 Essential (primary) hypertension: Secondary | ICD-10-CM

## 2024-01-07 MED ORDER — VALSARTAN-HYDROCHLOROTHIAZIDE 160-25 MG PO TABS: 1.0000 | ORAL_TABLET | Freq: Every morning | ORAL | 3 refills | Status: AC

## 2024-01-12 ENCOUNTER — Other Ambulatory Visit: Payer: Self-pay

## 2024-01-12 DIAGNOSIS — I10 Essential (primary) hypertension: Secondary | ICD-10-CM

## 2024-02-09 NOTE — Progress Notes (Signed)
 Billy Hale                                          MRN: [id number]   02/09/2024   The [facility] Quality Team Specialist reviewed this patient medical record for the purposes of chart review for care gap closure. The following were reviewed: abstraction for care gap closure-glycemic status assessment.    [facility] Quality Team

## 2024-04-14 ENCOUNTER — Ambulatory Visit: Admitting: Internal Medicine
